# Patient Record
Sex: Male | Born: 1940
Health system: Southern US, Community
[De-identification: ages and names within clinical notes are randomized; demographics above are authoritative.]

## PROBLEM LIST (undated history)

## (undated) DIAGNOSIS — I499 Cardiac arrhythmia, unspecified: Secondary | ICD-10-CM

## (undated) DIAGNOSIS — I509 Heart failure, unspecified: Secondary | ICD-10-CM

## (undated) DIAGNOSIS — Z972 Presence of dental prosthetic device (complete) (partial): Secondary | ICD-10-CM

## (undated) DIAGNOSIS — R609 Edema, unspecified: Secondary | ICD-10-CM

## (undated) DIAGNOSIS — T4145XA Adverse effect of unspecified anesthetic, initial encounter: Secondary | ICD-10-CM

## (undated) DIAGNOSIS — I5022 Chronic systolic (congestive) heart failure: Secondary | ICD-10-CM

## (undated) DIAGNOSIS — R42 Dizziness and giddiness: Secondary | ICD-10-CM

## (undated) DIAGNOSIS — R112 Nausea with vomiting, unspecified: Secondary | ICD-10-CM

## (undated) DIAGNOSIS — G4730 Sleep apnea, unspecified: Secondary | ICD-10-CM

## (undated) DIAGNOSIS — R002 Palpitations: Secondary | ICD-10-CM

## (undated) DIAGNOSIS — N400 Enlarged prostate without lower urinary tract symptoms: Secondary | ICD-10-CM

## (undated) DIAGNOSIS — Z87442 Personal history of urinary calculi: Secondary | ICD-10-CM

## (undated) DIAGNOSIS — E785 Hyperlipidemia, unspecified: Secondary | ICD-10-CM

## (undated) DIAGNOSIS — G709 Myoneural disorder, unspecified: Secondary | ICD-10-CM

## (undated) DIAGNOSIS — I5032 Chronic diastolic (congestive) heart failure: Secondary | ICD-10-CM

## (undated) DIAGNOSIS — T8859XA Other complications of anesthesia, initial encounter: Secondary | ICD-10-CM

## (undated) DIAGNOSIS — Z9889 Other specified postprocedural states: Secondary | ICD-10-CM

## (undated) DIAGNOSIS — I10 Essential (primary) hypertension: Secondary | ICD-10-CM

## (undated) HISTORY — PX: HERNIA REPAIR: SHX51

## (undated) HISTORY — PX: CARDIAC CATHETERIZATION: SHX172

## (undated) HISTORY — DX: Heart failure, unspecified: I50.9

## (undated) HISTORY — PX: EYE SURGERY: SHX253

## (undated) HISTORY — DX: Cardiac arrhythmia, unspecified: I49.9

---

## 2005-03-04 ENCOUNTER — Ambulatory Visit: Payer: Self-pay | Admitting: Internal Medicine

## 2005-10-08 ENCOUNTER — Ambulatory Visit: Payer: Self-pay | Admitting: Unknown Physician Specialty

## 2011-02-16 ENCOUNTER — Ambulatory Visit: Payer: Self-pay | Admitting: Unknown Physician Specialty

## 2011-02-18 LAB — PATHOLOGY REPORT

## 2012-04-26 ENCOUNTER — Ambulatory Visit: Payer: Self-pay | Admitting: Internal Medicine

## 2013-02-07 ENCOUNTER — Emergency Department: Payer: Self-pay | Admitting: Emergency Medicine

## 2013-02-07 LAB — URINALYSIS, COMPLETE
Bacteria: NONE SEEN
Glucose,UR: NEGATIVE mg/dL (ref 0–75)
Ketone: NEGATIVE
Leukocyte Esterase: NEGATIVE
Nitrite: NEGATIVE
Ph: 7 (ref 4.5–8.0)
Protein: NEGATIVE
RBC,UR: 23 /HPF (ref 0–5)
Specific Gravity: 1.03 (ref 1.003–1.030)
Squamous Epithelial: NONE SEEN
WBC UR: NONE SEEN /HPF (ref 0–5)

## 2013-02-07 LAB — CK TOTAL AND CKMB (NOT AT ARMC)
CK, Total: 121 U/L (ref 35–232)
CK-MB: 1 ng/mL (ref 0.5–3.6)

## 2013-02-07 LAB — CBC
HCT: 40.3 % (ref 40.0–52.0)
HGB: 13.8 g/dL (ref 13.0–18.0)
MCHC: 34.1 g/dL (ref 32.0–36.0)
MCV: 87 fL (ref 80–100)
RBC: 4.63 10*6/uL (ref 4.40–5.90)
RDW: 14.4 % (ref 11.5–14.5)

## 2013-02-07 LAB — TROPONIN I: Troponin-I: 0.04 ng/mL

## 2013-02-07 LAB — PROTIME-INR
INR: 1
Prothrombin Time: 13 secs (ref 11.5–14.7)

## 2013-02-07 LAB — COMPREHENSIVE METABOLIC PANEL
Anion Gap: 5 — ABNORMAL LOW (ref 7–16)
Calcium, Total: 8.8 mg/dL (ref 8.5–10.1)
Chloride: 104 mmol/L (ref 98–107)
Co2: 29 mmol/L (ref 21–32)
EGFR (Non-African Amer.): 58 — ABNORMAL LOW
Glucose: 99 mg/dL (ref 65–99)
Osmolality: 279 (ref 275–301)
SGOT(AST): 23 U/L (ref 15–37)
SGPT (ALT): 30 U/L (ref 12–78)
Sodium: 138 mmol/L (ref 136–145)
Total Protein: 7.4 g/dL (ref 6.4–8.2)

## 2014-04-14 DIAGNOSIS — N200 Calculus of kidney: Secondary | ICD-10-CM | POA: Insufficient documentation

## 2014-04-14 DIAGNOSIS — N401 Enlarged prostate with lower urinary tract symptoms: Secondary | ICD-10-CM | POA: Insufficient documentation

## 2014-04-14 DIAGNOSIS — G4733 Obstructive sleep apnea (adult) (pediatric): Secondary | ICD-10-CM | POA: Insufficient documentation

## 2014-04-14 DIAGNOSIS — E7800 Pure hypercholesterolemia, unspecified: Secondary | ICD-10-CM | POA: Insufficient documentation

## 2014-04-14 DIAGNOSIS — I10 Essential (primary) hypertension: Secondary | ICD-10-CM | POA: Insufficient documentation

## 2014-04-14 DIAGNOSIS — N529 Male erectile dysfunction, unspecified: Secondary | ICD-10-CM | POA: Insufficient documentation

## 2014-04-14 DIAGNOSIS — N400 Enlarged prostate without lower urinary tract symptoms: Secondary | ICD-10-CM | POA: Insufficient documentation

## 2015-10-21 DIAGNOSIS — J019 Acute sinusitis, unspecified: Secondary | ICD-10-CM | POA: Diagnosis not present

## 2015-10-21 DIAGNOSIS — N390 Urinary tract infection, site not specified: Secondary | ICD-10-CM | POA: Diagnosis not present

## 2015-10-21 DIAGNOSIS — B9689 Other specified bacterial agents as the cause of diseases classified elsewhere: Secondary | ICD-10-CM | POA: Diagnosis not present

## 2015-10-21 DIAGNOSIS — J208 Acute bronchitis due to other specified organisms: Secondary | ICD-10-CM | POA: Diagnosis not present

## 2015-10-21 DIAGNOSIS — R030 Elevated blood-pressure reading, without diagnosis of hypertension: Secondary | ICD-10-CM | POA: Diagnosis not present

## 2015-10-21 DIAGNOSIS — E784 Other hyperlipidemia: Secondary | ICD-10-CM | POA: Diagnosis not present

## 2015-10-28 DIAGNOSIS — E782 Mixed hyperlipidemia: Secondary | ICD-10-CM | POA: Diagnosis not present

## 2015-10-28 DIAGNOSIS — Z125 Encounter for screening for malignant neoplasm of prostate: Secondary | ICD-10-CM | POA: Diagnosis not present

## 2015-10-28 DIAGNOSIS — Z0000 Encounter for general adult medical examination without abnormal findings: Secondary | ICD-10-CM | POA: Diagnosis not present

## 2015-10-28 DIAGNOSIS — I10 Essential (primary) hypertension: Secondary | ICD-10-CM | POA: Diagnosis not present

## 2015-10-28 DIAGNOSIS — E7800 Pure hypercholesterolemia, unspecified: Secondary | ICD-10-CM | POA: Diagnosis not present

## 2015-12-05 DIAGNOSIS — H9313 Tinnitus, bilateral: Secondary | ICD-10-CM | POA: Diagnosis not present

## 2015-12-05 DIAGNOSIS — G4733 Obstructive sleep apnea (adult) (pediatric): Secondary | ICD-10-CM | POA: Diagnosis not present

## 2015-12-05 DIAGNOSIS — E7800 Pure hypercholesterolemia, unspecified: Secondary | ICD-10-CM | POA: Diagnosis not present

## 2015-12-05 DIAGNOSIS — I10 Essential (primary) hypertension: Secondary | ICD-10-CM | POA: Diagnosis not present

## 2016-02-19 DIAGNOSIS — Z9889 Other specified postprocedural states: Secondary | ICD-10-CM | POA: Diagnosis not present

## 2016-02-19 DIAGNOSIS — Z86010 Personal history of colonic polyps: Secondary | ICD-10-CM | POA: Diagnosis not present

## 2016-02-19 DIAGNOSIS — R112 Nausea with vomiting, unspecified: Secondary | ICD-10-CM | POA: Diagnosis not present

## 2016-02-19 DIAGNOSIS — Z8371 Family history of colonic polyps: Secondary | ICD-10-CM | POA: Diagnosis not present

## 2016-04-01 DIAGNOSIS — H2513 Age-related nuclear cataract, bilateral: Secondary | ICD-10-CM | POA: Diagnosis not present

## 2016-04-20 DIAGNOSIS — R7309 Other abnormal glucose: Secondary | ICD-10-CM | POA: Diagnosis not present

## 2016-04-20 DIAGNOSIS — Z0000 Encounter for general adult medical examination without abnormal findings: Secondary | ICD-10-CM | POA: Diagnosis not present

## 2016-04-20 DIAGNOSIS — E7800 Pure hypercholesterolemia, unspecified: Secondary | ICD-10-CM | POA: Diagnosis not present

## 2016-04-20 DIAGNOSIS — Z125 Encounter for screening for malignant neoplasm of prostate: Secondary | ICD-10-CM | POA: Diagnosis not present

## 2016-04-20 DIAGNOSIS — I10 Essential (primary) hypertension: Secondary | ICD-10-CM | POA: Diagnosis not present

## 2016-04-27 DIAGNOSIS — I10 Essential (primary) hypertension: Secondary | ICD-10-CM | POA: Diagnosis not present

## 2016-04-27 DIAGNOSIS — Z0000 Encounter for general adult medical examination without abnormal findings: Secondary | ICD-10-CM | POA: Diagnosis not present

## 2016-04-27 DIAGNOSIS — G4733 Obstructive sleep apnea (adult) (pediatric): Secondary | ICD-10-CM | POA: Diagnosis not present

## 2016-04-27 DIAGNOSIS — E7800 Pure hypercholesterolemia, unspecified: Secondary | ICD-10-CM | POA: Diagnosis not present

## 2016-05-05 ENCOUNTER — Encounter: Payer: Self-pay | Admitting: *Deleted

## 2016-05-06 ENCOUNTER — Ambulatory Visit: Payer: PPO | Admitting: Anesthesiology

## 2016-05-06 ENCOUNTER — Ambulatory Visit
Admission: RE | Admit: 2016-05-06 | Discharge: 2016-05-06 | Disposition: A | Discharge status: Home / Self Care | Payer: PPO | Source: Ambulatory Visit | Attending: Unknown Physician Specialty | Admitting: Unknown Physician Specialty

## 2016-05-06 ENCOUNTER — Encounter: Payer: Self-pay | Admitting: *Deleted

## 2016-05-06 ENCOUNTER — Encounter
Admission: RE | Disposition: A | Discharge status: Home / Self Care | Payer: Self-pay | Source: Ambulatory Visit | Attending: Unknown Physician Specialty

## 2016-05-06 DIAGNOSIS — Z79899 Other long term (current) drug therapy: Secondary | ICD-10-CM | POA: Diagnosis not present

## 2016-05-06 DIAGNOSIS — K640 First degree hemorrhoids: Secondary | ICD-10-CM | POA: Insufficient documentation

## 2016-05-06 DIAGNOSIS — K635 Polyp of colon: Secondary | ICD-10-CM | POA: Diagnosis not present

## 2016-05-06 DIAGNOSIS — Z8371 Family history of colonic polyps: Secondary | ICD-10-CM | POA: Diagnosis not present

## 2016-05-06 DIAGNOSIS — Z86010 Personal history of colonic polyps: Secondary | ICD-10-CM | POA: Insufficient documentation

## 2016-05-06 DIAGNOSIS — N400 Enlarged prostate without lower urinary tract symptoms: Secondary | ICD-10-CM | POA: Diagnosis not present

## 2016-05-06 DIAGNOSIS — I10 Essential (primary) hypertension: Secondary | ICD-10-CM | POA: Insufficient documentation

## 2016-05-06 DIAGNOSIS — E785 Hyperlipidemia, unspecified: Secondary | ICD-10-CM | POA: Diagnosis not present

## 2016-05-06 DIAGNOSIS — K5730 Diverticulosis of large intestine without perforation or abscess without bleeding: Secondary | ICD-10-CM | POA: Diagnosis not present

## 2016-05-06 DIAGNOSIS — D122 Benign neoplasm of ascending colon: Secondary | ICD-10-CM | POA: Diagnosis not present

## 2016-05-06 DIAGNOSIS — K5790 Diverticulosis of intestine, part unspecified, without perforation or abscess without bleeding: Secondary | ICD-10-CM | POA: Diagnosis not present

## 2016-05-06 DIAGNOSIS — K648 Other hemorrhoids: Secondary | ICD-10-CM | POA: Diagnosis not present

## 2016-05-06 DIAGNOSIS — Z7982 Long term (current) use of aspirin: Secondary | ICD-10-CM | POA: Diagnosis not present

## 2016-05-06 DIAGNOSIS — Z1211 Encounter for screening for malignant neoplasm of colon: Secondary | ICD-10-CM | POA: Diagnosis not present

## 2016-05-06 HISTORY — DX: Benign prostatic hyperplasia without lower urinary tract symptoms: N40.0

## 2016-05-06 HISTORY — DX: Hyperlipidemia, unspecified: E78.5

## 2016-05-06 HISTORY — DX: Essential (primary) hypertension: I10

## 2016-05-06 HISTORY — DX: Sleep apnea, unspecified: G47.30

## 2016-05-06 HISTORY — PX: COLONOSCOPY WITH PROPOFOL: SHX5780

## 2016-05-06 SURGERY — COLONOSCOPY WITH PROPOFOL
Anesthesia: General

## 2016-05-06 MED ORDER — PROPOFOL 500 MG/50ML IV EMUL
INTRAVENOUS | Status: DC | PRN
  Administered 2016-05-06: 150 ug/kg/min via INTRAVENOUS

## 2016-05-06 MED ORDER — EPHEDRINE SULFATE 50 MG/ML IJ SOLN
INTRAMUSCULAR | Status: DC | PRN
  Administered 2016-05-06 (×2): 10 mg via INTRAVENOUS

## 2016-05-06 MED ORDER — ONDANSETRON HCL 4 MG/2ML IJ SOLN
INTRAMUSCULAR | Status: DC | PRN
  Administered 2016-05-06: 4 mg via INTRAVENOUS

## 2016-05-06 MED ORDER — SODIUM CHLORIDE 0.9 % IV SOLN
INTRAVENOUS | Status: DC
  Administered 2016-05-06: 10:00:00 via INTRAVENOUS

## 2016-05-06 MED ORDER — SODIUM CHLORIDE 0.9 % IV SOLN: INTRAVENOUS | Status: DC

## 2016-05-06 MED ORDER — PROPOFOL 10 MG/ML IV BOLUS
INTRAVENOUS | Status: DC | PRN
  Administered 2016-05-06: 50 mg via INTRAVENOUS
  Administered 2016-05-06: 20 mg via INTRAVENOUS

## 2016-05-06 NOTE — Anesthesia Preprocedure Evaluation (Addendum)
Anesthesia Evaluation  Patient identified by MRN, date of birth, ID band Patient awake    Reviewed: Allergy & Precautions, NPO status , Patient's Chart, lab work & pertinent test results  Airway Mallampati: III  TM Distance: >3 FB     Dental  (+) Chipped, Caps   Pulmonary sleep apnea ,    Pulmonary exam normal        Cardiovascular hypertension, Pt. on medications Normal cardiovascular exam     Neuro/Psych negative neurological ROS  negative psych ROS   GI/Hepatic negative GI ROS, Neg liver ROS,   Endo/Other  negative endocrine ROS  Renal/GU negative Renal ROS  negative genitourinary   Musculoskeletal negative musculoskeletal ROS (+)   Abdominal Normal abdominal exam  (+)   Peds negative pediatric ROS (+)  Hematology negative hematology ROS (+)   Anesthesia Other Findings   Reproductive/Obstetrics                            Anesthesia Physical Anesthesia Plan  ASA: III  Anesthesia Plan: General   Post-op Pain Management:    Induction: Intravenous  Airway Management Planned: Nasal Cannula  Additional Equipment:   Intra-op Plan:   Post-operative Plan:   Informed Consent: I have reviewed the patients History and Physical, chart, labs and discussed the procedure including the risks, benefits and alternatives for the proposed anesthesia with the patient or authorized representative who has indicated his/her understanding and acceptance.   Dental advisory given  Plan Discussed with: CRNA and Surgeon  Anesthesia Plan Comments:         Anesthesia Quick Evaluation

## 2016-05-06 NOTE — Transfer of Care (Signed)
Immediate Anesthesia Transfer of Care Note  Patient: Cody Caraway Sr.  Procedure(s) Performed: Procedure(s): COLONOSCOPY WITH PROPOFOL (N/A)  Patient Location: PACU  Anesthesia Type:General  Level of Consciousness: unresponsive  Airway & Oxygen Therapy: Patient Spontanous Breathing and Patient connected to nasal cannula oxygen  Post-op Assessment: Report given to RN and Post -op Vital signs reviewed and stable  Post vital signs: Reviewed and stable  Last Vitals:  Vitals:   05/06/16 0920 05/06/16 1032  BP: 140/79 (!) 111/57  Pulse: 82 87  Resp: 18 (!) 22  Temp: 36.9 C 36.6 C    Last Pain:  Vitals:   05/06/16 1032  TempSrc: Tympanic         Complications: No apparent anesthesia complications

## 2016-05-06 NOTE — Op Note (Signed)
[facility] Gastroenterology Patient Name: Cody Pollard Procedure Date: 05/06/2016 10:01 AM MRN: [id number] Account #: [id number] Date of Birth: May 13, 1941 Admit Type: Outpatient Age: 75 Room: [facility] ENDO ROOM 4 Gender: Male Note Status: Finalized Procedure:            Colonoscopy Indications:          High risk colon cancer surveillance: Personal history                        of colonic polyps Providers:            Manya Silvas, MD Referring MD:         Ocie Cornfield. Ouida Sills MD, MD (Referring MD) Medicines:            Propofol per Anesthesia Complications:        No immediate complications. Procedure:            Pre-Anesthesia Assessment:                       - After reviewing the risks and benefits, the patient                        was deemed in satisfactory condition to undergo the                        procedure.                       After obtaining informed consent, the colonoscope was                        passed under direct vision. Throughout the procedure,                        the patient's blood pressure, pulse, and oxygen                        saturations were monitored continuously. The                        Colonoscope was introduced through the anus and                        advanced to the the cecum, identified by appendiceal                        orifice and ileocecal valve. The colonoscopy was                        performed without difficulty. The patient tolerated the                        procedure well. The quality of the bowel preparation                        was excellent. Findings:      Two sessile polyps were found in the ascending colon. The polyps were       diminutive in size. These polyps were removed with a jumbo cold forceps.       Resection and retrieval were complete.      A  diminutive polyp was found in the proximal sigmoid colon. The polyp       was sessile. The polyp was removed with a jumbo cold  forceps. Resection       and retrieval were complete.      Multiple small and large-mouthed diverticula were found in the sigmoid       colon and descending colon.      Internal hemorrhoids were found during endoscopy. The hemorrhoids were       small and Grade I (internal hemorrhoids that do not prolapse). Impression:           - Two diminutive polyps in the ascending colon, removed                        with a jumbo cold forceps. Resected and retrieved.                       - One diminutive polyp in the proximal sigmoid colon,                        removed with a jumbo cold forceps. Resected and                        retrieved.                       - Diverticulosis in the sigmoid colon and in the                        descending colon.                       - Internal hemorrhoids. Recommendation:       - Await pathology results. Manya Silvas, MD 05/06/2016 10:30:02 AM This report has been signed electronically. Number of Addenda: 0 Note Initiated On: 05/06/2016 10:01 AM Scope Withdrawal Time: 0 hours 11 minutes 11 seconds  Total Procedure Duration: 0 hours 15 minutes 24 seconds       [facility]

## 2016-05-06 NOTE — Anesthesia Postprocedure Evaluation (Signed)
Anesthesia Post Note  Patient: ANGELES KOELLNER Sr.  Procedure(s) Performed: Procedure(s) (LRB): COLONOSCOPY WITH PROPOFOL (N/A)  Patient location during evaluation: PACU Anesthesia Type: General Level of consciousness: awake and alert and oriented Pain management: pain level controlled Vital Signs Assessment: post-procedure vital signs reviewed and stable Respiratory status: spontaneous breathing Cardiovascular status: blood pressure returned to baseline Anesthetic complications: no    Last Vitals:  Vitals:   05/06/16 1053 05/06/16 1102  BP: (!) 147/72 (!) 163/80  Pulse: 80 (!) 109  Resp: 12 (!) 21  Temp:      Last Pain:  Vitals:   05/06/16 1032  TempSrc: Tympanic                 Alvey Brockel

## 2016-05-06 NOTE — H&P (Signed)
   Primary Care Physician:  Kirk Ruths., MD Primary Gastroenterologist:  Dr. Vira Agar  Pre-Procedure History & Physical: HPI:  Cody Caraway Sr. is a 75 y.o. male is here for an colonoscopy.   Past Medical History:  Diagnosis Date  . BPH (benign prostatic hyperplasia)   . Hyperlipidemia   . Hypertension   . Sleep apnea     History reviewed. No pertinent surgical history.  Prior to Admission medications   Medication Sig Start Date End Date Taking? Authorizing Provider  amLODipine (NORVASC) 5 MG tablet Take 5 mg by mouth daily.   Yes Historical Provider, MD  aspirin EC 81 MG tablet Take 81 mg by mouth daily.   Yes Historical Provider, MD  dutasteride (AVODART) 0.5 MG capsule Take 0.5 mg by mouth daily.   Yes Historical Provider, MD  ondansetron (ZOFRAN-ODT) 4 MG disintegrating tablet Take 4 mg by mouth every 8 (eight) hours as needed for nausea or vomiting.   Yes Historical Provider, MD  rosuvastatin (CRESTOR) 10 MG tablet Take 10 mg by mouth daily.   Yes Historical Provider, MD    Allergies as of 03/12/2016  . (Not on File)    History reviewed. No pertinent family history.  Social History   Social History  . Marital status: Married    Spouse name: N/A  . Number of children: N/A  . Years of education: N/A   Occupational History  . Not on file.   Social History Main Topics  . Smoking status: Never Smoker  . Smokeless tobacco: Never Used  . Alcohol use No  . Drug use: No  . Sexual activity: Not on file   Other Topics Concern  . Not on file   Social History Narrative  . No narrative on file    Review of Systems: See HPI, otherwise negative ROS  Physical Exam: BP 140/79   Pulse 82   Temp 98.4 F (36.9 C) (Tympanic)   Resp 18   Ht 5\' 11"  (1.803 m)   Wt 104.3 kg (230 lb)   SpO2 100%   BMI 32.08 kg/m  General:   Alert,  pleasant and cooperative in NAD Head:  Normocephalic and atraumatic. Neck:  Supple; no masses or thyromegaly. Lungs:   Clear throughout to auscultation.    Heart:  Regular rate and rhythm. Abdomen:  Soft, nontender and nondistended. Normal bowel sounds, without guarding, and without rebound.   Neurologic:  Alert and  oriented x4;  grossly normal neurologically.  Impression/Plan: Cody Caraway Sr. is here for an colonoscopy to be performed for [facility] colon polyps and [facility] colon polyps in brother.  Risks, benefits, limitations, and alternatives regarding  colonoscopy have been reviewed with the patient.  Questions have been answered.  All parties agreeable.   Gaylyn Cheers, MD  05/06/2016, 9:58 AM

## 2016-05-07 ENCOUNTER — Encounter: Payer: Self-pay | Admitting: Unknown Physician Specialty

## 2016-05-07 LAB — SURGICAL PATHOLOGY

## 2016-05-27 DIAGNOSIS — L820 Inflamed seborrheic keratosis: Secondary | ICD-10-CM | POA: Diagnosis not present

## 2016-05-27 DIAGNOSIS — L570 Actinic keratosis: Secondary | ICD-10-CM | POA: Diagnosis not present

## 2016-05-27 DIAGNOSIS — D1800 Hemangioma unspecified site: Secondary | ICD-10-CM | POA: Diagnosis not present

## 2016-05-27 DIAGNOSIS — L918 Other hypertrophic disorders of the skin: Secondary | ICD-10-CM | POA: Diagnosis not present

## 2016-05-27 DIAGNOSIS — L821 Other seborrheic keratosis: Secondary | ICD-10-CM | POA: Diagnosis not present

## 2016-05-27 DIAGNOSIS — I788 Other diseases of capillaries: Secondary | ICD-10-CM | POA: Diagnosis not present

## 2016-05-27 DIAGNOSIS — Z1283 Encounter for screening for malignant neoplasm of skin: Secondary | ICD-10-CM | POA: Diagnosis not present

## 2016-05-27 DIAGNOSIS — L718 Other rosacea: Secondary | ICD-10-CM | POA: Diagnosis not present

## 2016-05-27 DIAGNOSIS — L578 Other skin changes due to chronic exposure to nonionizing radiation: Secondary | ICD-10-CM | POA: Diagnosis not present

## 2016-05-27 DIAGNOSIS — D692 Other nonthrombocytopenic purpura: Secondary | ICD-10-CM | POA: Diagnosis not present

## 2016-05-27 DIAGNOSIS — D229 Melanocytic nevi, unspecified: Secondary | ICD-10-CM | POA: Diagnosis not present

## 2016-05-27 DIAGNOSIS — L812 Freckles: Secondary | ICD-10-CM | POA: Diagnosis not present

## 2016-06-01 DIAGNOSIS — J019 Acute sinusitis, unspecified: Secondary | ICD-10-CM | POA: Diagnosis not present

## 2016-06-01 DIAGNOSIS — I10 Essential (primary) hypertension: Secondary | ICD-10-CM | POA: Diagnosis not present

## 2016-06-01 DIAGNOSIS — R6889 Other general symptoms and signs: Secondary | ICD-10-CM | POA: Diagnosis not present

## 2016-06-01 DIAGNOSIS — B9689 Other specified bacterial agents as the cause of diseases classified elsewhere: Secondary | ICD-10-CM | POA: Diagnosis not present

## 2016-08-13 DIAGNOSIS — M25562 Pain in left knee: Secondary | ICD-10-CM | POA: Diagnosis not present

## 2016-08-13 DIAGNOSIS — G8929 Other chronic pain: Secondary | ICD-10-CM | POA: Diagnosis not present

## 2016-08-13 DIAGNOSIS — M2392 Unspecified internal derangement of left knee: Secondary | ICD-10-CM | POA: Diagnosis not present

## 2016-10-19 DIAGNOSIS — E7800 Pure hypercholesterolemia, unspecified: Secondary | ICD-10-CM | POA: Diagnosis not present

## 2016-10-19 DIAGNOSIS — R739 Hyperglycemia, unspecified: Secondary | ICD-10-CM | POA: Diagnosis not present

## 2016-10-19 DIAGNOSIS — Z0000 Encounter for general adult medical examination without abnormal findings: Secondary | ICD-10-CM | POA: Diagnosis not present

## 2016-10-19 DIAGNOSIS — I10 Essential (primary) hypertension: Secondary | ICD-10-CM | POA: Diagnosis not present

## 2016-10-26 DIAGNOSIS — E782 Mixed hyperlipidemia: Secondary | ICD-10-CM | POA: Diagnosis not present

## 2016-10-26 DIAGNOSIS — R7303 Prediabetes: Secondary | ICD-10-CM | POA: Insufficient documentation

## 2016-10-26 DIAGNOSIS — G4733 Obstructive sleep apnea (adult) (pediatric): Secondary | ICD-10-CM | POA: Diagnosis not present

## 2016-10-26 DIAGNOSIS — Z125 Encounter for screening for malignant neoplasm of prostate: Secondary | ICD-10-CM | POA: Diagnosis not present

## 2016-10-26 DIAGNOSIS — R7301 Impaired fasting glucose: Secondary | ICD-10-CM | POA: Diagnosis not present

## 2016-10-26 DIAGNOSIS — I10 Essential (primary) hypertension: Secondary | ICD-10-CM | POA: Diagnosis not present

## 2016-10-26 DIAGNOSIS — E7800 Pure hypercholesterolemia, unspecified: Secondary | ICD-10-CM | POA: Diagnosis not present

## 2017-03-17 ENCOUNTER — Other Ambulatory Visit: Payer: Self-pay | Admitting: Unknown Physician Specialty

## 2017-03-17 DIAGNOSIS — R51 Headache: Secondary | ICD-10-CM | POA: Diagnosis not present

## 2017-03-17 DIAGNOSIS — M792 Neuralgia and neuritis, unspecified: Secondary | ICD-10-CM | POA: Diagnosis not present

## 2017-03-17 DIAGNOSIS — G500 Trigeminal neuralgia: Secondary | ICD-10-CM

## 2017-03-22 ENCOUNTER — Ambulatory Visit
Admission: RE | Admit: 2017-03-22 | Discharge: 2017-03-22 | Disposition: A | Discharge status: Home / Self Care | Payer: PPO | Source: Ambulatory Visit | Attending: Unknown Physician Specialty | Admitting: Unknown Physician Specialty

## 2017-03-22 DIAGNOSIS — G500 Trigeminal neuralgia: Secondary | ICD-10-CM | POA: Diagnosis not present

## 2017-03-22 DIAGNOSIS — R51 Headache: Secondary | ICD-10-CM | POA: Diagnosis not present

## 2017-03-22 LAB — POCT I-STAT CREATININE: Creatinine, Ser: 1.1 mg/dL (ref 0.61–1.24)

## 2017-03-22 MED ORDER — GADOBENATE DIMEGLUMINE 529 MG/ML IV SOLN
20.0000 mL | Freq: Once | INTRAVENOUS | Status: AC | PRN
  Administered 2017-03-22: 20 mL via INTRAVENOUS

## 2017-04-05 DIAGNOSIS — G500 Trigeminal neuralgia: Secondary | ICD-10-CM | POA: Diagnosis not present

## 2017-04-26 DIAGNOSIS — I10 Essential (primary) hypertension: Secondary | ICD-10-CM | POA: Diagnosis not present

## 2017-04-26 DIAGNOSIS — Z125 Encounter for screening for malignant neoplasm of prostate: Secondary | ICD-10-CM | POA: Diagnosis not present

## 2017-04-26 DIAGNOSIS — R7301 Impaired fasting glucose: Secondary | ICD-10-CM | POA: Diagnosis not present

## 2017-04-26 DIAGNOSIS — E7800 Pure hypercholesterolemia, unspecified: Secondary | ICD-10-CM | POA: Diagnosis not present

## 2017-04-30 DIAGNOSIS — E7800 Pure hypercholesterolemia, unspecified: Secondary | ICD-10-CM | POA: Diagnosis not present

## 2017-04-30 DIAGNOSIS — I10 Essential (primary) hypertension: Secondary | ICD-10-CM | POA: Diagnosis not present

## 2017-04-30 DIAGNOSIS — G4733 Obstructive sleep apnea (adult) (pediatric): Secondary | ICD-10-CM | POA: Diagnosis not present

## 2017-04-30 DIAGNOSIS — R7301 Impaired fasting glucose: Secondary | ICD-10-CM | POA: Diagnosis not present

## 2017-04-30 DIAGNOSIS — Z0000 Encounter for general adult medical examination without abnormal findings: Secondary | ICD-10-CM | POA: Diagnosis not present

## 2017-08-04 DIAGNOSIS — H2513 Age-related nuclear cataract, bilateral: Secondary | ICD-10-CM | POA: Diagnosis not present

## 2017-09-03 DIAGNOSIS — H2512 Age-related nuclear cataract, left eye: Secondary | ICD-10-CM | POA: Diagnosis not present

## 2017-09-09 ENCOUNTER — Encounter: Payer: Self-pay | Admitting: *Deleted

## 2017-09-23 ENCOUNTER — Encounter
Admission: RE | Disposition: A | Discharge status: Home / Self Care | Payer: Self-pay | Source: Ambulatory Visit | Attending: Ophthalmology

## 2017-09-23 ENCOUNTER — Other Ambulatory Visit: Payer: Self-pay

## 2017-09-23 ENCOUNTER — Ambulatory Visit
Admission: RE | Admit: 2017-09-23 | Discharge: 2017-09-23 | Disposition: A | Discharge status: Home / Self Care | Payer: PPO | Source: Ambulatory Visit | Attending: Ophthalmology | Admitting: Ophthalmology

## 2017-09-23 ENCOUNTER — Ambulatory Visit: Payer: PPO | Admitting: Anesthesiology

## 2017-09-23 DIAGNOSIS — G4730 Sleep apnea, unspecified: Secondary | ICD-10-CM | POA: Diagnosis not present

## 2017-09-23 DIAGNOSIS — H2512 Age-related nuclear cataract, left eye: Secondary | ICD-10-CM | POA: Insufficient documentation

## 2017-09-23 DIAGNOSIS — Z6831 Body mass index (BMI) 31.0-31.9, adult: Secondary | ICD-10-CM | POA: Insufficient documentation

## 2017-09-23 DIAGNOSIS — G500 Trigeminal neuralgia: Secondary | ICD-10-CM | POA: Diagnosis not present

## 2017-09-23 DIAGNOSIS — N400 Enlarged prostate without lower urinary tract symptoms: Secondary | ICD-10-CM | POA: Insufficient documentation

## 2017-09-23 DIAGNOSIS — I10 Essential (primary) hypertension: Secondary | ICD-10-CM | POA: Insufficient documentation

## 2017-09-23 DIAGNOSIS — Z880 Allergy status to penicillin: Secondary | ICD-10-CM | POA: Diagnosis not present

## 2017-09-23 DIAGNOSIS — E669 Obesity, unspecified: Secondary | ICD-10-CM | POA: Diagnosis not present

## 2017-09-23 DIAGNOSIS — R42 Dizziness and giddiness: Secondary | ICD-10-CM | POA: Insufficient documentation

## 2017-09-23 DIAGNOSIS — M7989 Other specified soft tissue disorders: Secondary | ICD-10-CM | POA: Diagnosis not present

## 2017-09-23 DIAGNOSIS — R002 Palpitations: Secondary | ICD-10-CM | POA: Diagnosis not present

## 2017-09-23 DIAGNOSIS — E7800 Pure hypercholesterolemia, unspecified: Secondary | ICD-10-CM | POA: Diagnosis not present

## 2017-09-23 DIAGNOSIS — Z87442 Personal history of urinary calculi: Secondary | ICD-10-CM | POA: Insufficient documentation

## 2017-09-23 HISTORY — PX: CATARACT EXTRACTION W/PHACO: SHX586

## 2017-09-23 HISTORY — DX: Dizziness and giddiness: R42

## 2017-09-23 HISTORY — DX: Nausea with vomiting, unspecified: Z98.890

## 2017-09-23 HISTORY — DX: Edema, unspecified: R60.9

## 2017-09-23 HISTORY — DX: Palpitations: R00.2

## 2017-09-23 HISTORY — DX: Myoneural disorder, unspecified: G70.9

## 2017-09-23 HISTORY — DX: Nausea with vomiting, unspecified: R11.2

## 2017-09-23 HISTORY — DX: Other complications of anesthesia, initial encounter: T88.59XA

## 2017-09-23 HISTORY — DX: Personal history of urinary calculi: Z87.442

## 2017-09-23 HISTORY — DX: Adverse effect of unspecified anesthetic, initial encounter: T41.45XA

## 2017-09-23 SURGERY — PHACOEMULSIFICATION, CATARACT, WITH IOL INSERTION
Anesthesia: Monitor Anesthesia Care | Site: Eye | Laterality: Left | Wound class: Clean

## 2017-09-23 MED ORDER — LIDOCAINE HCL (PF) 4 % IJ SOLN
INTRAMUSCULAR | Status: AC
  Filled 2017-09-23: qty 5

## 2017-09-23 MED ORDER — SODIUM HYALURONATE 10 MG/ML IO SOLN
INTRAOCULAR | Status: DC | PRN
  Administered 2017-09-23: .55 mL via INTRAOCULAR

## 2017-09-23 MED ORDER — SODIUM HYALURONATE 23 MG/ML IO SOLN
INTRAOCULAR | Status: AC
  Filled 2017-09-23: qty 0.6

## 2017-09-23 MED ORDER — MOXIFLOXACIN HCL 0.5 % OP SOLN
OPHTHALMIC | Status: DC | PRN
  Administered 2017-09-23: .2 mL via OPHTHALMIC

## 2017-09-23 MED ORDER — BSS IO SOLN
INTRAOCULAR | Status: DC | PRN
  Administered 2017-09-23: 2 mL via OPHTHALMIC

## 2017-09-23 MED ORDER — SODIUM CHLORIDE 0.9 % IV SOLN
INTRAVENOUS | Status: DC
  Administered 2017-09-23: 09:00:00 via INTRAVENOUS

## 2017-09-23 MED ORDER — EPINEPHRINE PF 1 MG/ML IJ SOLN
INTRAOCULAR | Status: DC | PRN
  Administered 2017-09-23: 1 mL via OPHTHALMIC

## 2017-09-23 MED ORDER — MOXIFLOXACIN HCL 0.5 % OP SOLN
OPHTHALMIC | Status: AC
  Filled 2017-09-23: qty 3

## 2017-09-23 MED ORDER — POVIDONE-IODINE 5 % OP SOLN
OPHTHALMIC | Status: AC
  Filled 2017-09-23: qty 30

## 2017-09-23 MED ORDER — SODIUM HYALURONATE 23 MG/ML IO SOLN
INTRAOCULAR | Status: DC | PRN
  Administered 2017-09-23: .6 mL via INTRAOCULAR

## 2017-09-23 MED ORDER — MIDAZOLAM HCL 2 MG/2ML IJ SOLN
INTRAMUSCULAR | Status: DC | PRN
  Administered 2017-09-23: 2 mg via INTRAVENOUS

## 2017-09-23 MED ORDER — ARMC OPHTHALMIC DILATING DROPS
OPHTHALMIC | Status: AC
  Administered 2017-09-23: 1 via OPHTHALMIC
  Filled 2017-09-23: qty 0.4

## 2017-09-23 MED ORDER — MIDAZOLAM HCL 2 MG/2ML IJ SOLN
INTRAMUSCULAR | Status: AC
  Filled 2017-09-23: qty 2

## 2017-09-23 MED ORDER — POVIDONE-IODINE 5 % OP SOLN
OPHTHALMIC | Status: DC | PRN
  Administered 2017-09-23: 1 via OPHTHALMIC

## 2017-09-23 MED ORDER — MOXIFLOXACIN HCL 0.5 % OP SOLN: 1.0000 [drp] | OPHTHALMIC | Status: DC | PRN

## 2017-09-23 MED ORDER — EPINEPHRINE PF 1 MG/ML IJ SOLN
INTRAMUSCULAR | Status: AC
  Filled 2017-09-23: qty 1

## 2017-09-23 MED ORDER — ARMC OPHTHALMIC DILATING DROPS
1.0000 "application " | OPHTHALMIC | Status: AC
  Administered 2017-09-23 (×3): 1 via OPHTHALMIC

## 2017-09-23 SURGICAL SUPPLY — 16 items
DISSECTOR HYDRO NUCLEUS 50X22 (MISCELLANEOUS) ×3 IMPLANT
GLOVE BIO SURGEON STRL SZ8 (GLOVE) ×3 IMPLANT
GLOVE BIOGEL M 6.5 STRL (GLOVE) ×3 IMPLANT
GLOVE SURG LX 7.5 STRW (GLOVE) ×2
GLOVE SURG LX STRL 7.5 STRW (GLOVE) ×1 IMPLANT
GOWN STRL REUS W/ TWL LRG LVL3 (GOWN DISPOSABLE) ×2 IMPLANT
GOWN STRL REUS W/TWL LRG LVL3 (GOWN DISPOSABLE) ×4
LABEL CATARACT MEDS ST (LABEL) ×3 IMPLANT
LENS IOL TECNIS ITEC 17.0 (Intraocular Lens) ×3 IMPLANT
PACK CATARACT (MISCELLANEOUS) ×3 IMPLANT
PACK CATARACT KING (MISCELLANEOUS) ×3 IMPLANT
PACK EYE AFTER SURG (MISCELLANEOUS) ×3 IMPLANT
SOL BSS BAG (MISCELLANEOUS) ×3
SOLUTION BSS BAG (MISCELLANEOUS) ×1 IMPLANT
WATER STERILE IRR 250ML POUR (IV SOLUTION) ×3 IMPLANT
WIPE NON LINTING 3.25X3.25 (MISCELLANEOUS) ×3 IMPLANT

## 2017-09-23 NOTE — Anesthesia Preprocedure Evaluation (Signed)
Anesthesia Evaluation  Patient identified by MRN, date of birth, ID band Patient awake    Reviewed: Allergy & Precautions, NPO status , Patient's Chart, lab work & pertinent test results  History of Anesthesia Complications (+) PONV and history of anesthetic complications  Airway Mallampati: II  TM Distance: >3 FB Neck ROM: Full    Dental no notable dental hx.    Pulmonary sleep apnea (has CPAP prescribed but does not use) , neg COPD,    breath sounds clear to auscultation- rhonchi (-) wheezing      Cardiovascular Exercise Tolerance: Good hypertension, Pt. on medications (-) CAD, (-) Past MI, (-) Cardiac Stents and (-) CABG  Rhythm:Regular Rate:Normal - Systolic murmurs and - Diastolic murmurs    Neuro/Psych negative neurological ROS  negative psych ROS   GI/Hepatic negative GI ROS, Neg liver ROS,   Endo/Other  negative endocrine ROSneg diabetes  Renal/GU negative Renal ROS     Musculoskeletal negative musculoskeletal ROS (+)   Abdominal (+) + obese,   Peds  Hematology negative hematology ROS (+)   Anesthesia Other Findings Past Medical History: No date: BPH (benign prostatic hyperplasia) No date: Complication of anesthesia No date: Edema     Comment:  MILD FEET/ANKLES OCCAS No date: History of kidney stones No date: Hyperlipidemia No date: Hypertension No date: Neuromuscular disorder (HCC)     Comment:  TRIGEMINAL NEURALGIA No date: Palpitations No date: PONV (postoperative nausea and vomiting) No date: Sleep apnea     Comment:  DOES NOT USE CPAP No date: Vertigo   Reproductive/Obstetrics                             Anesthesia Physical Anesthesia Plan  ASA: II  Anesthesia Plan: MAC   Post-op Pain Management:    Induction: Intravenous  PONV Risk Score and Plan: 2 and Midazolam  Airway Management Planned: Natural Airway  Additional Equipment:   Intra-op Plan:    Post-operative Plan:   Informed Consent: I have reviewed the patients History and Physical, chart, labs and discussed the procedure including the risks, benefits and alternatives for the proposed anesthesia with the patient or authorized representative who has indicated his/her understanding and acceptance.     Plan Discussed with: CRNA and Anesthesiologist  Anesthesia Plan Comments:         Anesthesia Quick Evaluation

## 2017-09-23 NOTE — H&P (Signed)
The History and Physical notes are on paper, have been signed, and are to be scanned.   I have examined the patient and there are no changes to the H&P.   Benay Pillow 09/23/2017 11:05 AM

## 2017-09-23 NOTE — Op Note (Signed)
OPERATIVE NOTE  Cody SHEVCHENKO Sr. [id number] 09/23/2017   PREOPERATIVE DIAGNOSIS:  Nuclear sclerotic cataract left eye.  H25.12   POSTOPERATIVE DIAGNOSIS:    Nuclear sclerotic cataract left eye.     PROCEDURE:  Phacoemusification with posterior chamber intraocular lens placement of the left eye   LENS:   Implant Name Type Inv. Item Serial No. Manufacturer Lot No. LRB No. Used  LENS IOL DIOP 17.0 - [id number] Intraocular Lens LENS IOL DIOP 17.0 [id number] AMO  Left 1       PCB00 +17.0   ULTRASOUND TIME: 0 minutes 19 seconds.  CDE 1.64   SURGEON:  Benay Pillow, MD, MPH   ANESTHESIA:  Topical with tetracaine drops augmented with 1% preservative-free intracameral lidocaine.  ESTIMATED BLOOD LOSS: <1 mL   COMPLICATIONS:  None.   DESCRIPTION OF PROCEDURE:  The patient was identified in the holding room and transported to the operating room and placed in the supine position under the operating microscope.  The left eye was identified as the operative eye and it was prepped and draped in the usual sterile ophthalmic fashion.   A 1.0 millimeter clear-corneal paracentesis was made at the 5:00 position. 0.5 ml of preservative-free 1% lidocaine with epinephrine was injected into the anterior chamber.  The anterior chamber was filled with Healon 5 viscoelastic.  A 2.4 millimeter keratome was used to make a near-clear corneal incision at the 2:00 position.  A curvilinear capsulorrhexis was made with a cystotome and capsulorrhexis forceps.  Balanced salt solution was used to hydrodissect and hydrodelineate the nucleus.   Phacoemulsification was then used in stop and chop fashion to remove the lens nucleus and epinucleus.  The remaining cortex was then removed using the irrigation and aspiration handpiece. Healon was then placed into the capsular bag to distend it for lens placement.  A lens was then injected into the capsular bag.  The remaining viscoelastic was aspirated.   Wounds were  hydrated with balanced salt solution.  The anterior chamber was inflated to a physiologic pressure with balanced salt solution.  Intracameral vigamox 0.1 mL undiltued was injected into the eye and a drop placed onto the ocular surface.  No wound leaks were noted.  The patient was taken to the recovery room in stable condition without complications of anesthesia or surgery  Benay Pillow 09/23/2017, 11:35 AM

## 2017-09-23 NOTE — Anesthesia Post-op Follow-up Note (Signed)
Anesthesia QCDR form completed.        

## 2017-09-23 NOTE — Discharge Instructions (Signed)
Eye Surgery Discharge Instructions ° ° ° °Expect mild scratchy sensation or mild soreness. °DO NOT RUB YOUR EYE! ° °The day of surgery: °Minimal physical activity, but bed rest is not required °No reading, computer work, or close hand work °No bending, lifting, or straining. °May watch TV ° °For 24 hours: °No driving, legal decisions, or alcoholic beverages °Safety precautions °Eat anything you prefer: It is better to start with liquids, then soup then solid foods. °_____ Eye patch should be worn until postoperative exam tomorrow. °____ Solar shield eyeglasses should be worn for comfort in the sunlight/patch while sleeping ° °Resume all regular medications including aspirin or Coumadin if these were discontinued prior to surgery. °You may shower, bathe, shave, or wash your hair. °Tylenol may be taken for mild discomfort. ° °Call your doctor if you experience significant pain, nausea, or vomiting, fever > 101 or other signs of infection. [phone] or [phone] °Specific instructions: ° ° Follow-up Information   ° ° King, Bradley Mark, MD Follow up.   °Specialty: Ophthalmology °Contact information: °[address] °[address] °[place] [address] °[phone] ° ° °  °  ° °  °  ° °  °  °

## 2017-09-23 NOTE — Anesthesia Postprocedure Evaluation (Signed)
Anesthesia Post Note  Patient: DURIEL DEERY Sr.  Procedure(s) Performed: CATARACT EXTRACTION PHACO AND INTRAOCULAR LENS PLACEMENT ([place]) (Left Eye)  Patient location during evaluation: PACU Anesthesia Type: MAC Level of consciousness: awake and alert and oriented Pain management: pain level controlled Vital Signs Assessment: post-procedure vital signs reviewed and stable Respiratory status: spontaneous breathing, nonlabored ventilation and respiratory function stable Cardiovascular status: blood pressure returned to baseline and stable Postop Assessment: no signs of nausea or vomiting Anesthetic complications: no     Last Vitals:  Vitals:   09/23/17 1138 09/23/17 1150  BP: (!) 141/71 136/72  Pulse:    Resp: 18   Temp: (!) 36.2 C   SpO2: 99%     Last Pain:  Vitals:   09/23/17 1138  TempSrc:   PainSc: 0-No pain                 Daquane Aguilar

## 2017-09-23 NOTE — Transfer of Care (Signed)
Immediate Anesthesia Transfer of Care Note  Patient: Cody Caraway Sr.  Procedure(s) Performed: CATARACT EXTRACTION PHACO AND INTRAOCULAR LENS PLACEMENT ([place]) (Left Eye)  Patient Location: PACU  Anesthesia Type:MAC  Level of Consciousness: awake  Airway & Oxygen Therapy: Patient Spontanous Breathing  Post-op Assessment: Report given to RN  Post vital signs: stable  Last Vitals:  Vitals Value Taken Time  BP    Temp    Pulse    Resp    SpO2      Last Pain:  Vitals:   09/23/17 0839  TempSrc: Temporal  PainSc: 0-No pain         Complications: No apparent anesthesia complications

## 2017-10-04 DIAGNOSIS — I493 Ventricular premature depolarization: Secondary | ICD-10-CM | POA: Diagnosis not present

## 2017-10-05 DIAGNOSIS — G4733 Obstructive sleep apnea (adult) (pediatric): Secondary | ICD-10-CM | POA: Diagnosis not present

## 2017-10-05 DIAGNOSIS — I10 Essential (primary) hypertension: Secondary | ICD-10-CM | POA: Diagnosis not present

## 2017-10-05 DIAGNOSIS — G500 Trigeminal neuralgia: Secondary | ICD-10-CM | POA: Diagnosis not present

## 2017-10-05 DIAGNOSIS — E7800 Pure hypercholesterolemia, unspecified: Secondary | ICD-10-CM | POA: Diagnosis not present

## 2017-10-05 DIAGNOSIS — R079 Chest pain, unspecified: Secondary | ICD-10-CM | POA: Diagnosis not present

## 2017-10-05 DIAGNOSIS — I493 Ventricular premature depolarization: Secondary | ICD-10-CM | POA: Diagnosis not present

## 2017-10-11 DIAGNOSIS — I493 Ventricular premature depolarization: Secondary | ICD-10-CM | POA: Insufficient documentation

## 2017-10-12 DIAGNOSIS — R079 Chest pain, unspecified: Secondary | ICD-10-CM | POA: Diagnosis not present

## 2017-10-25 DIAGNOSIS — E7800 Pure hypercholesterolemia, unspecified: Secondary | ICD-10-CM | POA: Diagnosis not present

## 2017-10-25 DIAGNOSIS — I493 Ventricular premature depolarization: Secondary | ICD-10-CM | POA: Diagnosis not present

## 2017-10-25 DIAGNOSIS — I10 Essential (primary) hypertension: Secondary | ICD-10-CM | POA: Diagnosis not present

## 2017-10-25 DIAGNOSIS — G4733 Obstructive sleep apnea (adult) (pediatric): Secondary | ICD-10-CM | POA: Diagnosis not present

## 2017-11-04 DIAGNOSIS — R7301 Impaired fasting glucose: Secondary | ICD-10-CM | POA: Diagnosis not present

## 2017-11-04 DIAGNOSIS — I10 Essential (primary) hypertension: Secondary | ICD-10-CM | POA: Diagnosis not present

## 2017-11-04 DIAGNOSIS — E7800 Pure hypercholesterolemia, unspecified: Secondary | ICD-10-CM | POA: Diagnosis not present

## 2017-11-11 DIAGNOSIS — I10 Essential (primary) hypertension: Secondary | ICD-10-CM | POA: Diagnosis not present

## 2017-11-11 DIAGNOSIS — E7800 Pure hypercholesterolemia, unspecified: Secondary | ICD-10-CM | POA: Diagnosis not present

## 2017-11-11 DIAGNOSIS — Z125 Encounter for screening for malignant neoplasm of prostate: Secondary | ICD-10-CM | POA: Diagnosis not present

## 2017-11-11 DIAGNOSIS — R7301 Impaired fasting glucose: Secondary | ICD-10-CM | POA: Diagnosis not present

## 2017-11-11 DIAGNOSIS — G4733 Obstructive sleep apnea (adult) (pediatric): Secondary | ICD-10-CM | POA: Diagnosis not present

## 2018-04-11 DIAGNOSIS — G500 Trigeminal neuralgia: Secondary | ICD-10-CM | POA: Diagnosis not present

## 2018-04-25 DIAGNOSIS — Z125 Encounter for screening for malignant neoplasm of prostate: Secondary | ICD-10-CM | POA: Diagnosis not present

## 2018-04-25 DIAGNOSIS — R7301 Impaired fasting glucose: Secondary | ICD-10-CM | POA: Diagnosis not present

## 2018-04-25 DIAGNOSIS — I10 Essential (primary) hypertension: Secondary | ICD-10-CM | POA: Diagnosis not present

## 2018-04-25 DIAGNOSIS — E7800 Pure hypercholesterolemia, unspecified: Secondary | ICD-10-CM | POA: Diagnosis not present

## 2018-05-02 DIAGNOSIS — R7301 Impaired fasting glucose: Secondary | ICD-10-CM | POA: Diagnosis not present

## 2018-05-02 DIAGNOSIS — G4733 Obstructive sleep apnea (adult) (pediatric): Secondary | ICD-10-CM | POA: Diagnosis not present

## 2018-05-02 DIAGNOSIS — Z0000 Encounter for general adult medical examination without abnormal findings: Secondary | ICD-10-CM | POA: Diagnosis not present

## 2018-05-02 DIAGNOSIS — E7800 Pure hypercholesterolemia, unspecified: Secondary | ICD-10-CM | POA: Diagnosis not present

## 2018-05-02 DIAGNOSIS — I493 Ventricular premature depolarization: Secondary | ICD-10-CM | POA: Diagnosis not present

## 2018-05-02 DIAGNOSIS — I10 Essential (primary) hypertension: Secondary | ICD-10-CM | POA: Diagnosis not present

## 2018-10-27 DIAGNOSIS — R7301 Impaired fasting glucose: Secondary | ICD-10-CM | POA: Diagnosis not present

## 2018-10-27 DIAGNOSIS — I10 Essential (primary) hypertension: Secondary | ICD-10-CM | POA: Diagnosis not present

## 2018-10-27 DIAGNOSIS — E7800 Pure hypercholesterolemia, unspecified: Secondary | ICD-10-CM | POA: Diagnosis not present

## 2018-11-03 DIAGNOSIS — G4733 Obstructive sleep apnea (adult) (pediatric): Secondary | ICD-10-CM | POA: Diagnosis not present

## 2018-11-03 DIAGNOSIS — M79605 Pain in left leg: Secondary | ICD-10-CM | POA: Diagnosis not present

## 2018-11-03 DIAGNOSIS — E7800 Pure hypercholesterolemia, unspecified: Secondary | ICD-10-CM | POA: Diagnosis not present

## 2018-11-03 DIAGNOSIS — R7301 Impaired fasting glucose: Secondary | ICD-10-CM | POA: Diagnosis not present

## 2018-11-03 DIAGNOSIS — I10 Essential (primary) hypertension: Secondary | ICD-10-CM | POA: Diagnosis not present

## 2018-11-03 DIAGNOSIS — M1612 Unilateral primary osteoarthritis, left hip: Secondary | ICD-10-CM | POA: Diagnosis not present

## 2018-11-03 DIAGNOSIS — G5712 Meralgia paresthetica, left lower limb: Secondary | ICD-10-CM | POA: Diagnosis not present

## 2019-01-23 DIAGNOSIS — B353 Tinea pedis: Secondary | ICD-10-CM | POA: Diagnosis not present

## 2019-01-23 DIAGNOSIS — Z1283 Encounter for screening for malignant neoplasm of skin: Secondary | ICD-10-CM | POA: Diagnosis not present

## 2019-01-23 DIAGNOSIS — B351 Tinea unguium: Secondary | ICD-10-CM | POA: Diagnosis not present

## 2019-01-23 DIAGNOSIS — I781 Nevus, non-neoplastic: Secondary | ICD-10-CM | POA: Diagnosis not present

## 2019-01-23 DIAGNOSIS — L814 Other melanin hyperpigmentation: Secondary | ICD-10-CM | POA: Diagnosis not present

## 2019-01-23 DIAGNOSIS — L820 Inflamed seborrheic keratosis: Secondary | ICD-10-CM | POA: Diagnosis not present

## 2019-01-23 DIAGNOSIS — D1800 Hemangioma unspecified site: Secondary | ICD-10-CM | POA: Diagnosis not present

## 2019-01-23 DIAGNOSIS — L821 Other seborrheic keratosis: Secondary | ICD-10-CM | POA: Diagnosis not present

## 2019-01-23 DIAGNOSIS — L720 Epidermal cyst: Secondary | ICD-10-CM | POA: Diagnosis not present

## 2019-01-23 DIAGNOSIS — L578 Other skin changes due to chronic exposure to nonionizing radiation: Secondary | ICD-10-CM | POA: Diagnosis not present

## 2019-03-23 DIAGNOSIS — D3131 Benign neoplasm of right choroid: Secondary | ICD-10-CM | POA: Diagnosis not present

## 2019-05-01 DIAGNOSIS — Z125 Encounter for screening for malignant neoplasm of prostate: Secondary | ICD-10-CM | POA: Diagnosis not present

## 2019-05-01 DIAGNOSIS — I10 Essential (primary) hypertension: Secondary | ICD-10-CM | POA: Diagnosis not present

## 2019-05-01 DIAGNOSIS — E7800 Pure hypercholesterolemia, unspecified: Secondary | ICD-10-CM | POA: Diagnosis not present

## 2019-05-01 DIAGNOSIS — R7301 Impaired fasting glucose: Secondary | ICD-10-CM | POA: Diagnosis not present

## 2019-05-08 DIAGNOSIS — I10 Essential (primary) hypertension: Secondary | ICD-10-CM | POA: Diagnosis not present

## 2019-05-08 DIAGNOSIS — Z0000 Encounter for general adult medical examination without abnormal findings: Secondary | ICD-10-CM | POA: Diagnosis not present

## 2019-05-08 DIAGNOSIS — E7800 Pure hypercholesterolemia, unspecified: Secondary | ICD-10-CM | POA: Diagnosis not present

## 2019-05-08 DIAGNOSIS — G4733 Obstructive sleep apnea (adult) (pediatric): Secondary | ICD-10-CM | POA: Diagnosis not present

## 2019-05-08 DIAGNOSIS — R7301 Impaired fasting glucose: Secondary | ICD-10-CM | POA: Diagnosis not present

## 2019-11-01 DIAGNOSIS — I10 Essential (primary) hypertension: Secondary | ICD-10-CM | POA: Diagnosis not present

## 2019-11-01 DIAGNOSIS — R7301 Impaired fasting glucose: Secondary | ICD-10-CM | POA: Diagnosis not present

## 2019-11-01 DIAGNOSIS — E7800 Pure hypercholesterolemia, unspecified: Secondary | ICD-10-CM | POA: Diagnosis not present

## 2019-11-07 DIAGNOSIS — G4733 Obstructive sleep apnea (adult) (pediatric): Secondary | ICD-10-CM | POA: Diagnosis not present

## 2019-11-07 DIAGNOSIS — I10 Essential (primary) hypertension: Secondary | ICD-10-CM | POA: Diagnosis not present

## 2019-11-07 DIAGNOSIS — R7301 Impaired fasting glucose: Secondary | ICD-10-CM | POA: Diagnosis not present

## 2019-11-07 DIAGNOSIS — E7800 Pure hypercholesterolemia, unspecified: Secondary | ICD-10-CM | POA: Diagnosis not present

## 2020-01-29 ENCOUNTER — Encounter: Payer: PPO | Admitting: Dermatology

## 2020-05-07 DIAGNOSIS — I10 Essential (primary) hypertension: Secondary | ICD-10-CM | POA: Diagnosis not present

## 2020-05-07 DIAGNOSIS — E7800 Pure hypercholesterolemia, unspecified: Secondary | ICD-10-CM | POA: Diagnosis not present

## 2020-05-07 DIAGNOSIS — R7301 Impaired fasting glucose: Secondary | ICD-10-CM | POA: Diagnosis not present

## 2020-05-07 DIAGNOSIS — Z125 Encounter for screening for malignant neoplasm of prostate: Secondary | ICD-10-CM | POA: Diagnosis not present

## 2020-05-14 DIAGNOSIS — R7303 Prediabetes: Secondary | ICD-10-CM | POA: Diagnosis not present

## 2020-05-14 DIAGNOSIS — I10 Essential (primary) hypertension: Secondary | ICD-10-CM | POA: Diagnosis not present

## 2020-05-14 DIAGNOSIS — Z0000 Encounter for general adult medical examination without abnormal findings: Secondary | ICD-10-CM | POA: Diagnosis not present

## 2020-05-14 DIAGNOSIS — Z23 Encounter for immunization: Secondary | ICD-10-CM | POA: Diagnosis not present

## 2020-05-14 DIAGNOSIS — G4733 Obstructive sleep apnea (adult) (pediatric): Secondary | ICD-10-CM | POA: Diagnosis not present

## 2020-05-14 DIAGNOSIS — E7800 Pure hypercholesterolemia, unspecified: Secondary | ICD-10-CM | POA: Diagnosis not present

## 2020-07-11 DIAGNOSIS — H2511 Age-related nuclear cataract, right eye: Secondary | ICD-10-CM | POA: Diagnosis not present

## 2020-11-05 DIAGNOSIS — I10 Essential (primary) hypertension: Secondary | ICD-10-CM | POA: Diagnosis not present

## 2020-11-05 DIAGNOSIS — R7303 Prediabetes: Secondary | ICD-10-CM | POA: Diagnosis not present

## 2020-11-05 DIAGNOSIS — E7800 Pure hypercholesterolemia, unspecified: Secondary | ICD-10-CM | POA: Diagnosis not present

## 2020-11-12 DIAGNOSIS — Z125 Encounter for screening for malignant neoplasm of prostate: Secondary | ICD-10-CM | POA: Diagnosis not present

## 2020-11-12 DIAGNOSIS — R7303 Prediabetes: Secondary | ICD-10-CM | POA: Diagnosis not present

## 2020-11-12 DIAGNOSIS — I10 Essential (primary) hypertension: Secondary | ICD-10-CM | POA: Diagnosis not present

## 2020-11-12 DIAGNOSIS — G4733 Obstructive sleep apnea (adult) (pediatric): Secondary | ICD-10-CM | POA: Diagnosis not present

## 2020-11-12 DIAGNOSIS — Z23 Encounter for immunization: Secondary | ICD-10-CM | POA: Diagnosis not present

## 2020-11-12 DIAGNOSIS — E7800 Pure hypercholesterolemia, unspecified: Secondary | ICD-10-CM | POA: Diagnosis not present

## 2021-04-21 ENCOUNTER — Ambulatory Visit: Payer: PPO | Admitting: Podiatry

## 2021-04-23 ENCOUNTER — Ambulatory Visit: Payer: PPO | Admitting: Podiatry

## 2021-04-23 ENCOUNTER — Ambulatory Visit (INDEPENDENT_AMBULATORY_CARE_PROVIDER_SITE_OTHER): Payer: PPO

## 2021-04-23 ENCOUNTER — Encounter: Payer: Self-pay | Admitting: Podiatry

## 2021-04-23 ENCOUNTER — Ambulatory Visit: Payer: PPO

## 2021-04-23 ENCOUNTER — Other Ambulatory Visit: Payer: Self-pay

## 2021-04-23 DIAGNOSIS — M722 Plantar fascial fibromatosis: Secondary | ICD-10-CM | POA: Diagnosis not present

## 2021-04-23 DIAGNOSIS — M7750 Other enthesopathy of unspecified foot: Secondary | ICD-10-CM

## 2021-04-23 DIAGNOSIS — M7752 Other enthesopathy of left foot: Secondary | ICD-10-CM

## 2021-04-23 NOTE — Patient Instructions (Signed)

## 2021-04-27 NOTE — Progress Notes (Signed)
  Subjective:  Patient ID: Cody Caraway Sr., male    DOB: [birth year],  MRN: [id number]  Chief Complaint  Patient presents with   Plantar Fasciitis    New pt- Right foot planter fasciitis getting worse very painful. Patient would like cortizone shot    80 y.o. male presents with the above complaint. History confirmed with patient.  Previously has had this and saw Dr. Milinda Pointer previously who did an injection been going on for about 3 weeks now.  His left ankle is very stiff and causes pain he previously fractured that leg  Objective:  Physical Exam: warm, good capillary refill, no trophic changes or ulcerative lesions, normal DP and PT pulses, and normal sensory exam. Left Foot:  Pain and swelling around the anterior ankle with limited range of motion Right Foot: point tenderness over the heel pad  No images are attached to the encounter.  Radiographs: Multiple views x-ray of left ankle and both feet: Arthritic changes around the left ankle with anterior spurring and joint space narrowing, on the right foot he has a plantar calcaneal spur Assessment:   1. Plantar fasciitis, right   2. Tendonitis of ankle or foot      Plan:  Patient was evaluated and treated and all questions answered.  Reviewed his ankle radiographs with him and discussed he has posttraumatic ankle arthritis.  Currently this is minimally bothersome.  I discussed injection therapy with him if this continues to worsen.  He will call and see [place] as needed for this.  Discussed the etiology and treatment options for plantar fasciitis including stretching, formal physical therapy, supportive shoegears such as a running shoe or sneaker, pre fabricated orthoses, injection therapy, and oral medications. We also discussed the role of surgical treatment of this for patients who do not improve after exhausting non-surgical treatment options.   -XR reviewed with patient -Educated patient on stretching and icing of the affected  limb -Injection delivered to the plantar fascia of the right foot.  After sterile prep with povidone-iodine solution and alcohol, the right heel was injected with 0.5cc 2% xylocaine plain, 0.5cc 0.5% marcaine plain, 5mg  triamcinolone acetonide, and 2mg  dexamethasone was injected along the medial plantar fascia at the insertion on the plantar calcaneus. The patient tolerated the procedure well without complication.  Return if symptoms worsen or fail to improve.

## 2021-05-12 DIAGNOSIS — R7303 Prediabetes: Secondary | ICD-10-CM | POA: Diagnosis not present

## 2021-05-12 DIAGNOSIS — Z125 Encounter for screening for malignant neoplasm of prostate: Secondary | ICD-10-CM | POA: Diagnosis not present

## 2021-05-12 DIAGNOSIS — I10 Essential (primary) hypertension: Secondary | ICD-10-CM | POA: Diagnosis not present

## 2021-05-19 DIAGNOSIS — G5712 Meralgia paresthetica, left lower limb: Secondary | ICD-10-CM | POA: Diagnosis not present

## 2021-05-19 DIAGNOSIS — Z0000 Encounter for general adult medical examination without abnormal findings: Secondary | ICD-10-CM | POA: Diagnosis not present

## 2021-05-19 DIAGNOSIS — I10 Essential (primary) hypertension: Secondary | ICD-10-CM | POA: Diagnosis not present

## 2021-05-19 DIAGNOSIS — E7800 Pure hypercholesterolemia, unspecified: Secondary | ICD-10-CM | POA: Diagnosis not present

## 2021-05-19 DIAGNOSIS — M79671 Pain in right foot: Secondary | ICD-10-CM | POA: Diagnosis not present

## 2021-05-19 DIAGNOSIS — I493 Ventricular premature depolarization: Secondary | ICD-10-CM | POA: Diagnosis not present

## 2021-05-19 DIAGNOSIS — R7303 Prediabetes: Secondary | ICD-10-CM | POA: Diagnosis not present

## 2021-06-09 DIAGNOSIS — Z87442 Personal history of urinary calculi: Secondary | ICD-10-CM | POA: Diagnosis not present

## 2021-06-09 DIAGNOSIS — M545 Low back pain, unspecified: Secondary | ICD-10-CM | POA: Diagnosis not present

## 2021-06-09 DIAGNOSIS — M47816 Spondylosis without myelopathy or radiculopathy, lumbar region: Secondary | ICD-10-CM | POA: Diagnosis not present

## 2021-06-09 DIAGNOSIS — M8588 Other specified disorders of bone density and structure, other site: Secondary | ICD-10-CM | POA: Diagnosis not present

## 2021-06-11 DIAGNOSIS — R001 Bradycardia, unspecified: Secondary | ICD-10-CM | POA: Diagnosis not present

## 2021-06-11 DIAGNOSIS — R0902 Hypoxemia: Secondary | ICD-10-CM | POA: Diagnosis not present

## 2021-06-11 DIAGNOSIS — R0602 Shortness of breath: Secondary | ICD-10-CM | POA: Diagnosis not present

## 2021-06-12 ENCOUNTER — Other Ambulatory Visit: Payer: Self-pay

## 2021-06-12 ENCOUNTER — Emergency Department: Payer: PPO

## 2021-06-12 ENCOUNTER — Encounter: Payer: Self-pay | Admitting: Emergency Medicine

## 2021-06-12 ENCOUNTER — Inpatient Hospital Stay
Admission: EM | Admit: 2021-06-12 | Discharge: 2021-06-13 | DRG: 286 | Disposition: A | Discharge status: Home / Self Care | Payer: PPO | Attending: Internal Medicine | Admitting: Internal Medicine

## 2021-06-12 ENCOUNTER — Inpatient Hospital Stay
Admit: 2021-06-12 | Discharge: 2021-06-12 | Disposition: A | Discharge status: Home / Self Care | Payer: PPO | Attending: Emergency Medicine | Admitting: Emergency Medicine

## 2021-06-12 DIAGNOSIS — Z91199 Patient's noncompliance with other medical treatment and regimen due to unspecified reason: Secondary | ICD-10-CM

## 2021-06-12 DIAGNOSIS — R002 Palpitations: Secondary | ICD-10-CM

## 2021-06-12 DIAGNOSIS — I110 Hypertensive heart disease with heart failure: Principal | ICD-10-CM | POA: Diagnosis present

## 2021-06-12 DIAGNOSIS — I420 Dilated cardiomyopathy: Secondary | ICD-10-CM | POA: Diagnosis not present

## 2021-06-12 DIAGNOSIS — I509 Heart failure, unspecified: Secondary | ICD-10-CM

## 2021-06-12 DIAGNOSIS — J209 Acute bronchitis, unspecified: Secondary | ICD-10-CM

## 2021-06-12 DIAGNOSIS — K761 Chronic passive congestion of liver: Secondary | ICD-10-CM | POA: Diagnosis present

## 2021-06-12 DIAGNOSIS — R7303 Prediabetes: Secondary | ICD-10-CM | POA: Diagnosis not present

## 2021-06-12 DIAGNOSIS — E7800 Pure hypercholesterolemia, unspecified: Secondary | ICD-10-CM | POA: Diagnosis not present

## 2021-06-12 DIAGNOSIS — Z6828 Body mass index (BMI) 28.0-28.9, adult: Secondary | ICD-10-CM | POA: Diagnosis not present

## 2021-06-12 DIAGNOSIS — M5430 Sciatica, unspecified side: Secondary | ICD-10-CM | POA: Diagnosis present

## 2021-06-12 DIAGNOSIS — Z79899 Other long term (current) drug therapy: Secondary | ICD-10-CM

## 2021-06-12 DIAGNOSIS — Z87442 Personal history of urinary calculi: Secondary | ICD-10-CM | POA: Diagnosis not present

## 2021-06-12 DIAGNOSIS — R7401 Elevation of levels of liver transaminase levels: Secondary | ICD-10-CM

## 2021-06-12 DIAGNOSIS — I5033 Acute on chronic diastolic (congestive) heart failure: Secondary | ICD-10-CM | POA: Diagnosis not present

## 2021-06-12 DIAGNOSIS — I10 Essential (primary) hypertension: Secondary | ICD-10-CM | POA: Diagnosis not present

## 2021-06-12 DIAGNOSIS — E785 Hyperlipidemia, unspecified: Secondary | ICD-10-CM

## 2021-06-12 DIAGNOSIS — J9601 Acute respiratory failure with hypoxia: Secondary | ICD-10-CM | POA: Diagnosis not present

## 2021-06-12 DIAGNOSIS — K760 Fatty (change of) liver, not elsewhere classified: Secondary | ICD-10-CM | POA: Diagnosis not present

## 2021-06-12 DIAGNOSIS — Z7982 Long term (current) use of aspirin: Secondary | ICD-10-CM

## 2021-06-12 DIAGNOSIS — Z7952 Long term (current) use of systemic steroids: Secondary | ICD-10-CM

## 2021-06-12 DIAGNOSIS — Z880 Allergy status to penicillin: Secondary | ICD-10-CM

## 2021-06-12 DIAGNOSIS — I493 Ventricular premature depolarization: Secondary | ICD-10-CM | POA: Diagnosis not present

## 2021-06-12 DIAGNOSIS — I5021 Acute systolic (congestive) heart failure: Secondary | ICD-10-CM | POA: Diagnosis present

## 2021-06-12 DIAGNOSIS — I428 Other cardiomyopathies: Secondary | ICD-10-CM | POA: Diagnosis not present

## 2021-06-12 DIAGNOSIS — R0602 Shortness of breath: Secondary | ICD-10-CM | POA: Diagnosis not present

## 2021-06-12 DIAGNOSIS — G4733 Obstructive sleep apnea (adult) (pediatric): Secondary | ICD-10-CM | POA: Diagnosis not present

## 2021-06-12 DIAGNOSIS — J440 Chronic obstructive pulmonary disease with acute lower respiratory infection: Secondary | ICD-10-CM | POA: Diagnosis present

## 2021-06-12 DIAGNOSIS — N400 Enlarged prostate without lower urinary tract symptoms: Secondary | ICD-10-CM | POA: Diagnosis present

## 2021-06-12 DIAGNOSIS — Z20822 Contact with and (suspected) exposure to covid-19: Secondary | ICD-10-CM | POA: Diagnosis present

## 2021-06-12 DIAGNOSIS — J90 Pleural effusion, not elsewhere classified: Secondary | ICD-10-CM | POA: Diagnosis not present

## 2021-06-12 DIAGNOSIS — E669 Obesity, unspecified: Secondary | ICD-10-CM | POA: Diagnosis present

## 2021-06-12 DIAGNOSIS — R0603 Acute respiratory distress: Secondary | ICD-10-CM | POA: Diagnosis not present

## 2021-06-12 DIAGNOSIS — G500 Trigeminal neuralgia: Secondary | ICD-10-CM | POA: Diagnosis present

## 2021-06-12 LAB — COMPREHENSIVE METABOLIC PANEL
ALT: 53 U/L — ABNORMAL HIGH (ref 0–44)
AST: 53 U/L — ABNORMAL HIGH (ref 15–41)
Albumin: 3.9 g/dL (ref 3.5–5.0)
Alkaline Phosphatase: 46 U/L (ref 38–126)
Anion gap: 7 (ref 5–15)
BUN: 35 mg/dL — ABNORMAL HIGH (ref 8–23)
CO2: 22 mmol/L (ref 22–32)
Calcium: 8.7 mg/dL — ABNORMAL LOW (ref 8.9–10.3)
Chloride: 107 mmol/L (ref 98–111)
Creatinine, Ser: 0.88 mg/dL (ref 0.61–1.24)
GFR, Estimated: >60 mL/min (ref >60)
Glucose, Bld: 101 mg/dL — ABNORMAL HIGH (ref 70–99)
Potassium: 4.1 mmol/L (ref 3.5–5.1)
Sodium: 136 mmol/L (ref 135–145)
Total Bilirubin: 1.6 mg/dL — ABNORMAL HIGH (ref 0.3–1.2)
Total Protein: 7 g/dL (ref 6.5–8.1)

## 2021-06-12 LAB — RESPIRATORY PANEL BY PCR

## 2021-06-12 LAB — TROPONIN I (HIGH SENSITIVITY)
Troponin I (High Sensitivity): 16 ng/L (ref <18)
Troponin I (High Sensitivity): 17 ng/L (ref <18)

## 2021-06-12 LAB — PROTIME-INR
INR: 1 (ref 0.8–1.2)
Prothrombin Time: 13.3 seconds (ref 11.4–15.2)

## 2021-06-12 LAB — RESP PANEL BY RT-PCR (FLU A&B, COVID) ARPGX2
Influenza A by PCR: NEGATIVE
Influenza B by PCR: NEGATIVE
SARS Coronavirus 2 by RT PCR: NEGATIVE

## 2021-06-12 LAB — URINALYSIS, COMPLETE (UACMP) WITH MICROSCOPIC
Bacteria, UA: NONE SEEN
Bilirubin Urine: NEGATIVE
Glucose, UA: NEGATIVE mg/dL
Hgb urine dipstick: NEGATIVE
Ketones, ur: NEGATIVE mg/dL
Leukocytes,Ua: NEGATIVE
Nitrite: NEGATIVE
Protein, ur: NEGATIVE mg/dL
Specific Gravity, Urine: <1.005 — ABNORMAL LOW (ref 1.005–1.030)
pH: 5 (ref 5.0–8.0)

## 2021-06-12 LAB — ECHOCARDIOGRAM COMPLETE
AR max vel: 1.85 cm2
AV Area VTI: 1.84 cm2
AV Area mean vel: 1.77 cm2
AV Mean grad: 6 mmHg
AV Peak grad: 9.9 mmHg
Ao pk vel: 1.57 m/s
Area-P 1/2: 6.12 cm2
Height: 71 in
MV VTI: 2.04 cm2
S' Lateral: 5.53 cm
Weight: 3601.43 oz

## 2021-06-12 LAB — CBC
HCT: 39.4 % (ref 39.0–52.0)
Hemoglobin: 12.8 g/dL — ABNORMAL LOW (ref 13.0–17.0)
MCH: 29.3 pg (ref 26.0–34.0)
MCHC: 32.5 g/dL (ref 30.0–36.0)
MCV: 90.2 fL (ref 80.0–100.0)
Platelets: 187 10*3/uL (ref 150–400)
RBC: 4.37 MIL/uL (ref 4.22–5.81)
RDW: 15 % (ref 11.5–15.5)
WBC: 7.7 10*3/uL (ref 4.0–10.5)
nRBC: 0 % (ref 0.0–0.2)

## 2021-06-12 LAB — D-DIMER, QUANTITATIVE: D-Dimer, Quant: 0.64 ug/mL-FEU — ABNORMAL HIGH (ref 0.00–0.50)

## 2021-06-12 LAB — LACTIC ACID, PLASMA
Lactic Acid, Venous: 1.3 mmol/L (ref 0.5–1.9)
Lactic Acid, Venous: 1.6 mmol/L (ref 0.5–1.9)

## 2021-06-12 LAB — BRAIN NATRIURETIC PEPTIDE: B Natriuretic Peptide: 904.2 pg/mL — ABNORMAL HIGH (ref 0.0–100.0)

## 2021-06-12 LAB — MAGNESIUM: Magnesium: 2.1 mg/dL (ref 1.7–2.4)

## 2021-06-12 LAB — PROCALCITONIN: Procalcitonin: <0.1 ng/mL

## 2021-06-12 LAB — APTT: aPTT: 31 seconds (ref 24–36)

## 2021-06-12 MED ORDER — ROSUVASTATIN CALCIUM 10 MG PO TABS
10.0000 mg | ORAL_TABLET | Freq: Every day | ORAL | Status: DC
  Administered 2021-06-12 – 2021-06-14 (×2): 10 mg via ORAL
  Filled 2021-06-12 (×3): qty 1

## 2021-06-12 MED ORDER — ENOXAPARIN SODIUM 60 MG/0.6ML IJ SOSY
0.5000 mg/kg | PREFILLED_SYRINGE | INTRAMUSCULAR | Status: DC
  Administered 2021-06-12: 08:00:00 50 mg via SUBCUTANEOUS
  Filled 2021-06-12: qty 0.6

## 2021-06-12 MED ORDER — HYDROCODONE-ACETAMINOPHEN 5-325 MG PO TABS: 1.0000 | ORAL_TABLET | Freq: Four times a day (QID) | ORAL | Status: DC | PRN

## 2021-06-12 MED ORDER — LISINOPRIL 10 MG PO TABS
10.0000 mg | ORAL_TABLET | Freq: Every day | ORAL | Status: DC
  Administered 2021-06-12: 10:00:00 10 mg via ORAL
  Filled 2021-06-12: qty 1

## 2021-06-12 MED ORDER — SPIRONOLACTONE 25 MG PO TABS
12.5000 mg | ORAL_TABLET | Freq: Every day | ORAL | Status: DC
  Administered 2021-06-12 – 2021-06-14 (×2): 12.5 mg via ORAL
  Filled 2021-06-12 (×3): qty 0.5

## 2021-06-12 MED ORDER — PREDNISONE 20 MG PO TABS
20.0000 mg | ORAL_TABLET | Freq: Two times a day (BID) | ORAL | Status: DC
  Administered 2021-06-12 (×2): 20 mg via ORAL
  Filled 2021-06-12 (×2): qty 1

## 2021-06-12 MED ORDER — SODIUM CHLORIDE 0.9% FLUSH
3.0000 mL | Freq: Two times a day (BID) | INTRAVENOUS | Status: DC
  Administered 2021-06-12 – 2021-06-14 (×3): 3 mL via INTRAVENOUS

## 2021-06-12 MED ORDER — ONDANSETRON HCL 4 MG/2ML IJ SOLN
4.0000 mg | Freq: Four times a day (QID) | INTRAMUSCULAR | Status: DC | PRN
  Administered 2021-06-12: 22:00:00 4 mg via INTRAVENOUS
  Filled 2021-06-12: qty 2

## 2021-06-12 MED ORDER — ACETAMINOPHEN 325 MG PO TABS: 650.0000 mg | ORAL_TABLET | ORAL | Status: DC | PRN

## 2021-06-12 MED ORDER — METHYLPREDNISOLONE SODIUM SUCC 125 MG IJ SOLR: 125.0000 mg | Freq: Once | INTRAMUSCULAR | Status: DC

## 2021-06-12 MED ORDER — SODIUM CHLORIDE 0.9% FLUSH
3.0000 mL | Freq: Two times a day (BID) | INTRAVENOUS | Status: DC
  Administered 2021-06-12 – 2021-06-14 (×2): 3 mL via INTRAVENOUS

## 2021-06-12 MED ORDER — FUROSEMIDE 10 MG/ML IJ SOLN
40.0000 mg | Freq: Two times a day (BID) | INTRAMUSCULAR | Status: DC
  Administered 2021-06-12 – 2021-06-13 (×2): 40 mg via INTRAVENOUS
  Filled 2021-06-12 (×3): qty 4

## 2021-06-12 MED ORDER — SODIUM CHLORIDE 0.9% FLUSH: 3.0000 mL | INTRAVENOUS | Status: DC | PRN

## 2021-06-12 MED ORDER — LOSARTAN POTASSIUM 50 MG PO TABS: 50.0000 mg | ORAL_TABLET | Freq: Every day | ORAL | Status: DC

## 2021-06-12 MED ORDER — IPRATROPIUM-ALBUTEROL 0.5-2.5 (3) MG/3ML IN SOLN
9.0000 mL | Freq: Once | RESPIRATORY_TRACT | Status: AC
  Administered 2021-06-12: 01:00:00 9 mL via RESPIRATORY_TRACT
  Filled 2021-06-12: qty 9

## 2021-06-12 MED ORDER — FUROSEMIDE 10 MG/ML IJ SOLN
40.0000 mg | Freq: Once | INTRAMUSCULAR | Status: AC
  Administered 2021-06-12: 02:00:00 40 mg via INTRAVENOUS
  Filled 2021-06-12: qty 4

## 2021-06-12 MED ORDER — IPRATROPIUM-ALBUTEROL 0.5-2.5 (3) MG/3ML IN SOLN: 3.0000 mL | RESPIRATORY_TRACT | Status: DC | PRN

## 2021-06-12 MED ORDER — FUROSEMIDE 10 MG/ML IJ SOLN
40.0000 mg | Freq: Once | INTRAMUSCULAR | Status: AC
  Administered 2021-06-12: 03:00:00 40 mg via INTRAVENOUS
  Filled 2021-06-12: qty 4

## 2021-06-12 MED ORDER — CARVEDILOL 6.25 MG PO TABS
6.2500 mg | ORAL_TABLET | Freq: Two times a day (BID) | ORAL | Status: DC
  Administered 2021-06-12 – 2021-06-14 (×5): 6.25 mg via ORAL
  Filled 2021-06-12 (×5): qty 1

## 2021-06-12 MED ORDER — SODIUM CHLORIDE 0.9 % IV SOLN: 250.0000 mL | INTRAVENOUS | Status: DC | PRN

## 2021-06-12 MED ORDER — ASPIRIN EC 81 MG PO TBEC
81.0000 mg | DELAYED_RELEASE_TABLET | Freq: Every day | ORAL | Status: DC
  Administered 2021-06-12: 10:00:00 81 mg via ORAL
  Filled 2021-06-12: qty 1

## 2021-06-12 NOTE — Consult Note (Signed)
CARDIOLOGY CONSULT NOTE               Patient ID: Cody Caraway Sr. MRN: [id number] DOB/AGE: 12-11-1940 80 y.o.  Admit date: 06/12/2021 Referring Physician Dr. Judd Gaudier Primary Physician Dr. Frazier Richards Primary Cardiologist Dr. Ubaldo Glassing Reason for Consultation shortness of breath dyspnea heart failure type symptoms  HPI: Patient is a 80 year old male history of hypertension obstructive sleep apnea prediabetes palpitation PVCs COPD obesity reportedly has obstructive sleep apnea has not been compliant with his CPAP.  Patient describes progressive dyspnea shortness of breath with minimal activity significant hypoxemia.  Current shortness of breath chest pain poorly had O2 sats in the 80s minimal leg edema shortness of breath thought that he may have chills.  Patient has a chronic lower extremity edema Most symptoms have improved received diuretics which seem to help no blackout spells or syncope  Review of systems complete and found to be negative unless listed above     Past Medical History:  Diagnosis Date   BPH (benign prostatic hyperplasia)    Complication of anesthesia    Edema    MILD FEET/ANKLES OCCAS   History of kidney stones    Hyperlipidemia    Hypertension    Neuromuscular disorder (HCC)    TRIGEMINAL NEURALGIA   Palpitations    PONV (postoperative nausea and vomiting)    Sleep apnea    DOES NOT USE CPAP   Vertigo     Past Surgical History:  Procedure Laterality Date   CATARACT EXTRACTION W/PHACO Left 09/23/2017   Procedure: CATARACT EXTRACTION PHACO AND INTRAOCULAR LENS PLACEMENT ([place]);  Surgeon: Eulogio Bear, MD;  Location: [facility] ORS;  Service: Ophthalmology;  Laterality: Left;  [place] 00:19 AP% 8.5 CDE 1.64 Fluid pak lot # [id number]   COLONOSCOPY WITH PROPOFOL N/A 05/06/2016   Procedure: COLONOSCOPY WITH PROPOFOL;  Surgeon: Manya Silvas, MD;  Location: [facility] ENDOSCOPY;  Service: Endoscopy;  Laterality: N/A;   HERNIA REPAIR      (Not in a  hospital admission)  Social History   Socioeconomic History   Marital status: Married    Spouse name: Not on file   Number of children: Not on file   Years of education: Not on file   Highest education level: Not on file  Occupational History   Not on file  Tobacco Use   Smoking status: Never   Smokeless tobacco: Never  Vaping Use   Vaping Use: Never used  Substance and Sexual Activity   Alcohol use: No   Drug use: No   Sexual activity: Not on file  Other Topics Concern   Not on file  Social History Narrative   Not on file   Social Determinants of Health   Financial Resource Strain: Not on file  Food Insecurity: Not on file  Transportation Needs: Not on file  Physical Activity: Not on file  Stress: Not on file  Social Connections: Not on file  Intimate Partner Violence: Not on file    History reviewed. No pertinent family history.    Review of systems complete and found to be negative unless listed above      PHYSICAL EXAM  General: Well developed, well nourished, in no acute distress HEENT:  Normocephalic and atramatic Neck:  No JVD.  Lungs: Clear bilaterally to auscultation and percussion. Heart: HRRR . Normal S1 and S2 without gallops or murmurs.  Abdomen: Bowel sounds are positive, abdomen soft and non-tender  Msk:  Back normal, normal gait. Normal strength and  tone for age. Extremities: No clubbing, cyanosis or edema.   Neuro: Alert and oriented X 3. Psych:  Good affect, responds appropriately  Labs:   Lab Results  Component Value Date   WBC 7.7 06/12/2021   HGB 12.8 (L) 06/12/2021   HCT 39.4 06/12/2021   MCV 90.2 06/12/2021   PLT 187 06/12/2021    Recent Labs  Lab 06/12/21 0039  NA 136  K 4.1  CL 107  CO2 22  BUN 35*  CREATININE 0.88  CALCIUM 8.7*  PROT 7.0  BILITOT 1.6*  ALKPHOS 46  ALT 53*  AST 53*  GLUCOSE 101*   Lab Results  Component Value Date   CKTOTAL 121 02/07/2013   CKMB 1.0 02/07/2013   TROPONINI 0.05 02/07/2013    No results found for: CHOL No results found for: HDL No results found for: LDLCALC No results found for: TRIG No results found for: CHOLHDL No results found for: LDLDIRECT    Radiology: DG Chest Portable 1 View  Result Date: 06/12/2021 CLINICAL DATA:  Shortness of breath. EXAM: PORTABLE CHEST 1 VIEW COMPARISON:  None. FINDINGS: Moderate to marked severity diffusely increased interstitial lung markings are seen bilaterally. Mild areas of atelectasis and/or infiltrate are seen along the bilateral infrahilar regions. There is no evidence of a pleural effusion or pneumothorax. The cardiac silhouette is mildly enlarged. The visualized skeletal structures are unremarkable. IMPRESSION: 1. Moderate to marked severity interstitial edema. 2. Mild bilateral infrahilar atelectasis and/or infiltrate. Electronically Signed   By: Virgina Norfolk M.D.   On: 06/12/2021 00:55   ECHOCARDIOGRAM COMPLETE  Result Date: 06/12/2021    ECHOCARDIOGRAM REPORT   Patient Name:   Cody MCINTURFF Sr. Date of Exam: 06/12/2021 Medical Rec #:  [id number]              Height:       71.0 in Accession #:    [id number]             Weight:       225.1 lb Date of Birth:  [birth year]               BSA:          2.217 m Patient Age:    80 years               BP:           147/85 mmHg Patient Gender: M                      HR:           73 bpm. Exam Location:  [facility] Procedure: 2D Echo, Color Doppler and Cardiac Doppler Indications:     R06.03 Acute respiratory distress  History:         Patient has no prior history of Echocardiogram examinations.                  Risk Factors:Hypertension, Dyslipidemia and Sleep Apnea.  Sonographer:     Charmayne Sheer Referring Phys:  [id number] Ida Rogue SMITH Diagnosing Phys: Yolonda Kida MD  Sonographer Comments: Suboptimal subcostal window. IMPRESSIONS  1. Left ventricular ejection fraction, by estimation, is 25 to 30%. The left ventricle has severely decreased function. The left ventricle demonstrates  global hypokinesis. The left ventricular internal cavity size was severely dilated. Left ventricular diastolic parameters are consistent with Grade I diastolic dysfunction (impaired relaxation).  2. Right ventricular systolic function is low normal. The right ventricular  size is normal.  3. Left atrial size was mildly dilated.  4. Right atrial size was mildly dilated.  5. The mitral valve is normal in structure. Trivial mitral valve regurgitation.  6. The aortic valve is normal in structure. Aortic valve regurgitation is not visualized. FINDINGS  Left Ventricle: Left ventricular ejection fraction, by estimation, is 25 to 30%. The left ventricle has severely decreased function. The left ventricle demonstrates global hypokinesis. The left ventricular internal cavity size was severely dilated. There is no left ventricular hypertrophy. Left ventricular diastolic parameters are consistent with Grade I diastolic dysfunction (impaired relaxation). Right Ventricle: The right ventricular size is normal. No increase in right ventricular wall thickness. Right ventricular systolic function is low normal. Left Atrium: Left atrial size was mildly dilated. Right Atrium: Right atrial size was mildly dilated. Pericardium: There is no evidence of pericardial effusion. Mitral Valve: The mitral valve is normal in structure. Trivial mitral valve regurgitation. MV peak gradient, 6.5 mmHg. The mean mitral valve gradient is 3.5 mmHg. Tricuspid Valve: The tricuspid valve is normal in structure. Tricuspid valve regurgitation is mild. Aortic Valve: The aortic valve is normal in structure. Aortic valve regurgitation is not visualized. Aortic valve mean gradient measures 6.0 mmHg. Aortic valve peak gradient measures 9.9 mmHg. Aortic valve area, by VTI measures 1.84 cm. Pulmonic Valve: The pulmonic valve was grossly normal. Pulmonic valve regurgitation is not visualized. Aorta: The ascending aorta was not well visualized. IAS/Shunts: No atrial  level shunt detected by color flow Doppler. Additional Comments: There is no pleural effusion.  LEFT VENTRICLE PLAX 2D LVIDd:         6.42 cm   Diastology LVIDs:         5.53 cm   LV e' medial:    4.46 cm/s LV PW:         1.15 cm   LV E/e' medial:  23.6 LV IVS:        0.94 cm   LV e' lateral:   11.60 cm/s LVOT diam:     2.00 cm   LV E/e' lateral: 9.1 LV SV:         55 LV SV Index:   25 LVOT Area:     3.14 cm  RIGHT VENTRICLE RV Basal diam:  2.82 cm RV S prime:     16.60 cm/s LEFT ATRIUM             Index        RIGHT ATRIUM           Index LA diam:        4.90 cm 2.21 cm/m   RA Area:     16.10 cm LA Vol (A2C):   73.0 ml 32.92 ml/m  RA Volume:   38.60 ml  17.41 ml/m LA Vol (A4C):   43.9 ml 19.80 ml/m LA Biplane Vol: 57.7 ml 26.02 ml/m  AORTIC VALVE                     PULMONIC VALVE AV Area (Vmax):    1.85 cm      PV Vmax:       1.25 m/s AV Area (Vmean):   1.77 cm      PV Vmean:      78.900 cm/s AV Area (VTI):     1.84 cm      PV VTI:        0.183 m AV Vmax:  157.00 cm/s   PV Peak grad:  6.2 mmHg AV Vmean:          114.000 cm/s  PV Mean grad:  3.0 mmHg AV VTI:            0.301 m AV Peak Grad:      9.9 mmHg AV Mean Grad:      6.0 mmHg LVOT Vmax:         92.40 cm/s LVOT Vmean:        64.100 cm/s LVOT VTI:          0.176 m LVOT/AV VTI ratio: 0.58  AORTA Ao Root diam: 3.10 cm MITRAL VALVE MV Area (PHT): 6.12 cm     SHUNTS MV Area VTI:   2.04 cm     Systemic VTI:  0.18 m MV Peak grad:  6.5 mmHg     Systemic Diam: 2.00 cm MV Mean grad:  3.5 mmHg MV Vmax:       1.27 m/s MV Vmean:      90.4 cm/s MV Decel Time: 124 msec MV E velocity: 105.20 cm/s MV A velocity: 119.50 cm/s MV E/A ratio:  0.88 Siddharth Babington D Lior Cartelli MD Electronically signed by Yolonda Kida MD Signature Date/Time: 06/12/2021/2:15:05 PM    Final    US ABDOMEN LIMITED RUQ (LIVER/GB)  Result Date: 06/12/2021 CLINICAL DATA:  Bilirubinemia. EXAM: ULTRASOUND ABDOMEN LIMITED RIGHT UPPER QUADRANT COMPARISON:  None. FINDINGS: Gallbladder: No  gallstones or wall thickening visualized (2.6 mm). No sonographic Murphy sign noted by sonographer. Common bile duct: Diameter: 3.8 mm Liver: No focal lesion identified. Mildly increased echogenicity of the liver parenchyma is noted. Portal vein is patent on color Doppler imaging with normal direction of blood flow towards the liver. Other: There is a small right pleural effusion. IMPRESSION: 1. Hepatic steatosis without focal liver lesions. 2. Small right pleural effusion. Electronically Signed   By: Virgina Norfolk M.D.   On: 06/12/2021 03:33    EKG: Normal sinus rhythm nonspecific ST-T changes  ASSESSMENT AND PLAN:  Congestive heart failure systolic dysfunction acute Cardiomyopathy systolic ejection fraction 20 to 25% Shortness of breath Hypertension Obesity Obstructive sleep apnea COPD Hyper lipidemia Hypertension Hypoxemia . Plan Agree with telemetry continue current monitoring Agree with following up with troponins and EKGs Continue IV diuretic therapy with Lasix up to twice a day Recommend switching from ACE to ARB and then probably subsequently to [facility] Agree with beta-blockade therapy continue Coreg Continue Crestor therapy hyperlipidemia Recommend adding low-dose spironolactone for heart failure management Inhalers as necessary for COPD shortness of breath dyspnea Follow-up BMP Agree with EKG still very depressed left ventricular function and left ventricular enlargement Obesity recommend weight loss exercise portion control Recommend repeat sleep study CPAP weight loss more aggressive attempt at adherence to CPAP Supplemental oxygen as necessary for hypoxemia  Signed: Yolonda Kida MD 06/12/2021, 10:36 PM

## 2021-06-12 NOTE — ED Notes (Signed)
Introduced self to pt for first time. Pt resting comfortably in bed at this time. Pt is alert and oriented with even and regular respirations. No acute distress noted. Pt denies any needs at this time. Call light within reach. Family remains at bedside.

## 2021-06-12 NOTE — ED Notes (Signed)
C/o of nausea. Medication will be given

## 2021-06-12 NOTE — Progress Notes (Signed)
*  PRELIMINARY RESULTS* Echocardiogram 2D Echocardiogram has been performed.  Cody Pollard 06/12/2021, 1:25 PM

## 2021-06-12 NOTE — Consult Note (Signed)
Brief cardiology consult note  Impression Congestive heart failure acute Cardiomyopathy systolic EF =27 to 63% Shortness of breath Hypertension Obesity Obstructive sleep apnea COPD Hypoxemia Hyperlipidemia Hepatic congestion  . Plan Agree with admit to telemetry Follow-up EKGs troponins Continue IV diuretic therapy with Lasix Recommend increasing ACE ARB We will switch to losartan from lisinopril in anticipation of switching to Entresto ultimately Continue Crestor therapy for lipid management Inhalers as needed for shortness of breath Will start low-dose spironolactone Recommend Coreg twice a day Agree with echocardiogram which showed depressed left ventricular function cardiomyopathy Continue hypertension management Patient needs stricter obstructive sleep apnea management Recommend right and left heart cardiac cath for new onset heart failure

## 2021-06-12 NOTE — ED Triage Notes (Signed)
Pt arrived via [facility] from home with reports of SOB that started Wednesday, pt has hx of irregular heart beat. Initial room air sats was 84%, up to 88% with 6L with EMS.given 1 duoneb sats with neb were 90%  Pt reports feeling better after neb. No hx of breathing problems, home covid test was negative.  Pt on 6L in ED was up 89%.   RT notified on arrival.   138/72

## 2021-06-12 NOTE — H&P (Addendum)
History and Physical    Cody Pollard TIW:[id number] DOB: [birth year] DOA: 06/12/2021  PCP: Kirk Ruths, MD   Patient coming from: home  I have personally briefly reviewed patient's relevant medical records in [place]  Chief Complaint: shortness of breath  HPI: Cody BARNEY Sr. is a 80 y.o. male with medical history significant for HTN, OSA, prediabetes, palpitations/PVCs, BPH, who presented to the ED acute onset shortness of breath.  He denies a history of asthma or COPD and denies tobacco use.  He is unable to tolerate CPAP.  States for the past several months he occasionally has a bit of a wheeze when he lies down to go to bed but it usually goes away on its own.  On the night of arrival he had 1 of these brief episodes and after he laid down to sleep, within 15 minutes he woke up struggling to breathe and called EMS.  Upon their arrival, O2 sat was 84% on room air requiring 6 L to maintain sats around 91.  He received duo nebs in route Patient denies chest pain.  He denies lower extremity pain.  He has chronic trace lower extremity edema which has been stable for the past 3 years.,  Denies fever or chills, COVID or flu exposure.  Denies nausea, vomiting, abdominal pain, diarrhea.  Was recently treated for sciatica on 12/19 and is currently on oral prednisone ED course:   Review of Systems: As per HPI otherwise all other systems on review of systems negative.     Acute respiratory failure with hypoxia (HCC) -Suspect a combination of acute bronchitis and acute CHF - D-dimer slightly elevated at 0.64, procalcitonin less than 0.1 - BiPAP and wean to O2 as tolerated - Treat acute conditions    Acute CHF (congestive heart failure) (HCC) - Chest x-ray with interstitial edema and BNP over 900 - Etiology possibly viral, possible uncontrolled hypertension. - Cardiac monitoring to evaluate for arrhythmias - Continue to trend troponin - IV Lasix, ACE inhibitor for  BP control with hydralazine as needed - Daily weights with intake and output monitoring - Echocardiogram in the a.m. - Cardiology consult   History of PVCs /Palpitations - Continuous cardiac monitoring - Keep magnesium over 2 and potassium over 4   Possible acute bronchitis, viral - Patient has occasional wheezing and a mild cough.  Procalcitonin less than 0.1 - COVID and flu negative - Respiratory viral panel - DuoNebs as needed, antitussives - Supplemental oxygen    Transaminitis - Mild elevation.  Likely secondary to hepatic congestion from suspected CHF - Continue to monitor   Essential hypertension - Hold home amlodipine - Lisinopril started.  Hydralazine as needed   OSA (obstructive sleep apnea) - CPAP nightly    Prediabetes - We will get A1c    DVT prophylaxis: Lovenox  Code Status: full code  Family Communication:  son at bedside  Disposition Plan: Back to previous home environment Consults called: Cardiology Status:At the time of admission, it appears that the appropriate admission status for this patient is INPATIENT. This is judged to be reasonable and necessary in order to provide the required intensity of service to ensure the patient's safety given the presenting symptoms, physical exam findings, and initial radiographic and laboratory data in the context of their  Comorbid conditions.   Patient requires inpatient status due to high intensity of service, high risk for further deterioration and high frequency of surveillance required.   I certify that at the point  of admission it is my clinical judgment that the patient will require inpatient hospital care spanning beyond 2 midnights     Physical Exam: Vitals:   06/12/21 0044 06/12/21 0100 06/12/21 0200 06/12/21 0230  BP:  (!) 162/75 (!) 156/68 (!) 160/80  Pulse:  (!) 26 89 98  Resp:  (!) 21 19 (!) 22  Temp:      TempSrc:      SpO2:  95% 94% 91%  Weight: 102.1 kg     Height: 5\' 11"  (1.803 m)       Constitutional: Alert, oriented x 3 .  Conversational dyspnea.  Sitting upright on bed HEENT:      Head: Normocephalic and atraumatic.         Eyes: PERLA, EOMI, Conjunctivae are normal. Sclera is non-icteric.       Mouth/Throat: Mucous membranes are moist.       Neck: Supple with no signs of meningismus. Cardiovascular: Regular rate and rhythm. No murmurs, gallops, or rubs. 2+ symmetrical distal pulses are present . No JVD. No  LE edema Respiratory: Respiratory effort slightly increased with bibasilar rales gastrointestinal: Soft, non tender, non distended. Positive bowel sounds.  Genitourinary: No CVA tenderness. Musculoskeletal: Nontender with normal range of motion in all extremities. No cyanosis, or erythema of extremities. Neurologic:  Face is symmetric. Moving all extremities. No gross focal neurologic deficits . Skin: Skin is warm, dry.  No rash or ulcers Psychiatric: Mood and affect are appropriate     Past Medical History:  Diagnosis Date   BPH (benign prostatic hyperplasia)    Complication of anesthesia    Edema    MILD FEET/ANKLES OCCAS   History of kidney stones    Hyperlipidemia    Hypertension    Neuromuscular disorder (HCC)    TRIGEMINAL NEURALGIA   Palpitations    PONV (postoperative nausea and vomiting)    Sleep apnea    DOES NOT USE CPAP   Vertigo     Past Surgical History:  Procedure Laterality Date   CATARACT EXTRACTION W/PHACO Left 09/23/2017   Procedure: CATARACT EXTRACTION PHACO AND INTRAOCULAR LENS PLACEMENT ([place]);  Surgeon: Eulogio Bear, MD;  Location: [facility] ORS;  Service: Ophthalmology;  Laterality: Left;  [place] 00:19 AP% 8.5 CDE 1.64 Fluid pak lot # [id number]   COLONOSCOPY WITH PROPOFOL N/A 05/06/2016   Procedure: COLONOSCOPY WITH PROPOFOL;  Surgeon: Manya Silvas, MD;  Location: [facility] ENDOSCOPY;  Service: Endoscopy;  Laterality: N/A;   HERNIA REPAIR       reports that he has never smoked. He has never used smokeless tobacco. He reports  that he does not drink alcohol and does not use drugs.  Allergies  Allergen Reactions   Penicillins     History reviewed. No pertinent family history.    Prior to Admission medications   Medication Sig Start Date End Date Taking? Authorizing Provider  amLODipine (NORVASC) 5 MG tablet Take 5 mg by mouth daily.   Yes [provider]  aspirin EC 81 MG tablet Take 81 mg by mouth daily.   Yes [provider]  dutasteride (AVODART) 0.5 MG capsule Take 0.5 mg by mouth daily. 12/24/13  Yes [provider]  gabapentin (NEURONTIN) 300 MG capsule Take 300 mg by mouth 3 (three) times daily. 04/04/21  Yes [provider]  HYDROcodone-acetaminophen (NORCO/VICODIN) 5-325 MG tablet Take 1 tablet by mouth every 6 (six) hours as needed. 06/09/21  Yes [provider]  metaxalone (SKELAXIN) 800 MG tablet Take 800  mg by mouth 3 (three) times daily. 06/09/21  Yes [provider]  predniSONE (DELTASONE) 20 MG tablet Take 20 mg by mouth 2 (two) times daily. 06/09/21  Yes [provider]  rosuvastatin (CRESTOR) 10 MG tablet Take 10 mg by mouth daily.   Yes [provider]      Labs on Admission: I have personally reviewed following labs and imaging studies  CBC: Recent Labs  Lab 06/12/21 0034  WBC 7.7  HGB 12.8*  HCT 39.4  MCV 90.2  PLT 270   Basic Metabolic Panel: Recent Labs  Lab 06/12/21 0039  NA 136  K 4.1  CL 107  CO2 22  GLUCOSE 101*  BUN 35*  CREATININE 0.88  CALCIUM 8.7*   GFR: Estimated Creatinine Clearance: 81.4 mL/min (by C-G formula based on SCr of 0.88 mg/dL). Liver Function Tests: Recent Labs  Lab 06/12/21 0039  AST 53*  ALT 53*  ALKPHOS 46  BILITOT 1.6*  PROT 7.0  ALBUMIN 3.9   No results for input(s): LIPASE, AMYLASE in the last 168 hours. No results for input(s): AMMONIA in the last 168 hours. Coagulation Profile: Recent Labs  Lab 06/12/21 0039  INR 1.0   Cardiac Enzymes: No results  for input(s): CKTOTAL, CKMB, CKMBINDEX, TROPONINI in the last 168 hours. BNP (last 3 results) No results for input(s): PROBNP in the last 8760 hours. HbA1C: No results for input(s): HGBA1C in the last 72 hours. CBG: No results for input(s): GLUCAP in the last 168 hours. Lipid Profile: No results for input(s): CHOL, HDL, LDLCALC, TRIG, CHOLHDL, LDLDIRECT in the last 72 hours. Thyroid Function Tests: No results for input(s): TSH, T4TOTAL, FREET4, T3FREE, THYROIDAB in the last 72 hours. Anemia Panel: No results for input(s): VITAMINB12, FOLATE, FERRITIN, TIBC, IRON, RETICCTPCT in the last 72 hours. Urine analysis:    Component Value Date/Time   COLORURINE Straw 02/07/2013 1552   APPEARANCEUR Clear 02/07/2013 1552   LABSPEC 1.030 02/07/2013 1552   PHURINE 7.0 02/07/2013 1552   GLUCOSEU Negative 02/07/2013 1552   HGBUR 2+ 02/07/2013 1552   BILIRUBINUR Negative 02/07/2013 1552   KETONESUR Negative 02/07/2013 1552   PROTEINUR Negative 02/07/2013 1552   NITRITE Negative 02/07/2013 1552   LEUKOCYTESUR Negative 02/07/2013 1552    Radiological Exams on Admission: DG Chest Portable 1 View  Result Date: 06/12/2021 CLINICAL DATA:  Shortness of breath. EXAM: PORTABLE CHEST 1 VIEW COMPARISON:  None. FINDINGS: Moderate to marked severity diffusely increased interstitial lung markings are seen bilaterally. Mild areas of atelectasis and/or infiltrate are seen along the bilateral infrahilar regions. There is no evidence of a pleural effusion or pneumothorax. The cardiac silhouette is mildly enlarged. The visualized skeletal structures are unremarkable. IMPRESSION: 1. Moderate to marked severity interstitial edema. 2. Mild bilateral infrahilar atelectasis and/or infiltrate. Electronically Signed   By: Virgina Norfolk M.D.   On: 06/12/2021 00:55       Athena Masse MD [facility]   06/12/2021, 3:05 AM

## 2021-06-12 NOTE — Progress Notes (Signed)
Anticoagulation monitoring(Lovenox):  80 yo male ordered Lovenox 40 mg Q24h    Filed Weights   06/12/21 0044  Weight: 102.1 kg (225 lb 1.4 oz)   BMI 31.4    Lab Results  Component Value Date   CREATININE 0.88 06/12/2021   CREATININE 1.10 03/22/2017   CREATININE 1.24 02/07/2013   Estimated Creatinine Clearance: 81.4 mL/min (by C-G formula based on SCr of 0.88 mg/dL). Hemoglobin & Hematocrit     Component Value Date/Time   HGB 12.8 (L) 06/12/2021 0034   HGB 13.8 02/07/2013 1446   HCT 39.4 06/12/2021 0034   HCT 40.3 02/07/2013 1446     Per Protocol for Patient with estCrcl > 30 ml/min and BMI > 30, will transition to Lovenox 50 mg Q24h.

## 2021-06-12 NOTE — Progress Notes (Signed)
Patient seen and examined in the emergency room.  Son was at the bedside.  He stayed in the ER all night.  In brief, 80 year old gentleman with history of hypertension who was feeling some shortness of breath on exertion for last many months had acute onset of dyspnea after working on [employer], [employer]/product for Christmas at home.  Retrospectively complained of orthopnea and PND.  In the emergency room, hemodynamically stable.  BNP more than 900.  Started on IV Lasix, he had 3.4 L of urine output and already feels better.  Oxygen was running in his cheek and was comfortable.  Not mobilized yet.  Suspected congestive heart failure: Medically stabilizing.  Continue IV Lasix today.  Intake output monitoring.  Recheck electrolytes tomorrow morning.  Started on low-dose lisinopril.  Continue.  Echocardiogram pending.  Mobilize.  Further management will depend upon echocardiogram findings.  Anticipate discharge home tomorrow.  Will need cardiology follow-up.  Total time spent: 20 minutes.  No charge visit.  Same-day admission.

## 2021-06-12 NOTE — Consult Note (Signed)
°  Heart Failure Nurse Navigator Note  Echocardiogram results are pending at this time  Presented to the emergency room with complaints of shortness of breath on exertion, orthopnea and PND.  He also states last week between 2 different doctors appointments at 1 he had weighed 214 pounds and at the second on he weighed 219 pounds.  Chest x-ray revealed interstitial edema.  He states he is established with Dr. Ubaldo Glassing and follows with him for hypertension.  Comorbidities:  Hypertension Obstructive sleep apnea intolerant of CPAP Prediabetes Palpitations/PVCs   Medications:  Furosemide 40 mg IV twice a day Lisinopril 10 mg daily Aspirin 81 mg daily Crestor 10 mg daily Prednisone 20 mg twice a day  He states at home he is also on Skelaxin, Avodart, Norvasc and gabapentin.  Labs:  Sodium 136, potassium 4.1, chloride 107, CO2 22, BUN 35, creatinine 0.88, BNP on admission 904.  Ultrasound of the right upper quadrant revealed hepatic steatosis, small right pleural effusion. Weight is 102.1 kg Blood pressure 143/81  Initial meeting with patient today, lying in the emergency room on a gurney getting his echocardiogram completed.  He states that he is breathing is much better.  Asked if he ever been told that he has heart failure in the past he said no.  When I asked him to explain what heart failure is he asked if it was a heart attack.  Explained that heart failure is the heart's inability to meet the demands of the body.  He states that he has a scale but does not weigh himself on a daily basis.  Splane the reasoning behind weighing daily, and recording.  Made aware to report 2 to 3 pound weight gain overnight or 5 pounds within the week.  He states that his wife is a very good cook but he eats daily fast food.  Explained the reasoning behind making smarter choices when eating at fast food/restaurants.  He does use salt at the table.  Splane the reasoning for removing the saltshaker from  the table.  Also discussed fluid restriction.  Made aware that he has an appointment in the outpatient heart failure clinic on January 11 at 1230.  He has a 0% of no-show.  Pricilla Riffle RN [facility]

## 2021-06-12 NOTE — ED Notes (Signed)
Pt had multiple 4 beat runs of Vtach. EKG obtained and admitting MD made aware.

## 2021-06-12 NOTE — ED Notes (Signed)
Spoke with Dr. Tamala Julian re: starting antibiotics and fluids, per Dr. Tamala Julian he is waiting to see the results of the chest xray

## 2021-06-12 NOTE — ED Provider Notes (Signed)
[facility] Emergency Department Provider Note  ____________________________________________   Event Date/Time   First MD Initiated Contact with Patient 06/12/21 [phone]     (approximate)  I have reviewed the triage vital signs and the nursing notes.   HISTORY  Chief Complaint Shortness of Breath   HPI Cody BERA Sr. is a 80 y.o. male with below noted past medical history who presents for assessment of shortness of breath with slight cough.  Patient states it started little bit yesterday but not much worse today.  He was wheezing significantly at home with EMS and received duo nebs with EMS.  He has never had history of COPD or asthma and has never smoked.  He denies any associated chest pain, abdominal pain, nausea, vomiting, urinary symptoms, diarrhea, rash or extremity pain.  No headache, earache, sore throat, congestion or any other clear associated sick symptoms.  Patient states he has a little bit of soreness in his low back and was given a steroid shot at [facility] about a week ago but otherwise has no new back pain and this is not any different today.  In his lower back.  No extremity weakness numbness or tingling or urinary incontinence.         Past Medical History:  Diagnosis Date   BPH (benign prostatic hyperplasia)    Complication of anesthesia    Edema    MILD FEET/ANKLES OCCAS   History of kidney stones    Hyperlipidemia    Hypertension    Neuromuscular disorder (HCC)    TRIGEMINAL NEURALGIA   Palpitations    PONV (postoperative nausea and vomiting)    Sleep apnea    DOES NOT USE CPAP   Vertigo     Patient Active Problem List   Diagnosis Date Noted   Palpitations    Acute CHF (congestive heart failure) ([place])    Acute respiratory failure with hypoxia (HCC)    Transaminitis    Acute bronchitis    PVC's (premature ventricular contractions) 10/11/2017   Trigeminal neuralgia 04/05/2017   Prediabetes 10/26/2016   Healthcare  maintenance 10/28/2015   Benign non-nodular prostatic hyperplasia with lower urinary tract symptoms 04/14/2014   ED (erectile dysfunction) of organic origin 04/14/2014   Essential hypertension 04/14/2014   Nephrolithiasis 04/14/2014   OSA (obstructive sleep apnea) 04/14/2014   Pure hypercholesterolemia 04/14/2014    Past Surgical History:  Procedure Laterality Date   CATARACT EXTRACTION W/PHACO Left 09/23/2017   Procedure: CATARACT EXTRACTION PHACO AND INTRAOCULAR LENS PLACEMENT ([place]);  Surgeon: Eulogio Bear, MD;  Location: [facility] ORS;  Service: Ophthalmology;  Laterality: Left;  [place] 00:19 AP% 8.5 CDE 1.64 Fluid pak lot # [id number]   COLONOSCOPY WITH PROPOFOL N/A 05/06/2016   Procedure: COLONOSCOPY WITH PROPOFOL;  Surgeon: Manya Silvas, MD;  Location: [facility] ENDOSCOPY;  Service: Endoscopy;  Laterality: N/A;   HERNIA REPAIR      Prior to Admission medications   Medication Sig Start Date End Date Taking? Authorizing Provider  amLODipine (NORVASC) 5 MG tablet Take 5 mg by mouth daily.   Yes [provider]  aspirin EC 81 MG tablet Take 81 mg by mouth daily.   Yes [provider]  dutasteride (AVODART) 0.5 MG capsule Take 0.5 mg by mouth daily. 12/24/13  Yes [provider]  gabapentin (NEURONTIN) 300 MG capsule Take 300 mg by mouth 3 (three) times daily. 04/04/21  Yes [provider]  HYDROcodone-acetaminophen (NORCO/VICODIN) 5-325 MG tablet Take 1 tablet by mouth every  6 (six) hours as needed. 06/09/21  Yes [provider]  metaxalone (SKELAXIN) 800 MG tablet Take 800 mg by mouth 3 (three) times daily. 06/09/21  Yes [provider]  predniSONE (DELTASONE) 20 MG tablet Take 20 mg by mouth 2 (two) times daily. 06/09/21  Yes [provider]  rosuvastatin (CRESTOR) 10 MG tablet Take 10 mg by mouth daily.   Yes [provider]    Allergies Penicillins  History reviewed. No pertinent family history.  Social  History Social History   Tobacco Use   Smoking status: Never   Smokeless tobacco: Never  Vaping Use   Vaping Use: Never used  Substance Use Topics   Alcohol use: No   Drug use: No    Review of Systems  Review of Systems  Constitutional:  Negative for chills and fever.  HENT:  Negative for sore throat.   Eyes:  Negative for pain.  Respiratory:  Positive for cough, shortness of breath and wheezing. Negative for stridor.   Cardiovascular:  Negative for chest pain.  Gastrointestinal:  Negative for vomiting.  Musculoskeletal:  Positive for back pain.  Skin:  Negative for rash.  Neurological:  Negative for seizures, loss of consciousness and headaches.  Psychiatric/Behavioral:  Negative for suicidal ideas.   All other systems reviewed and are negative.    ____________________________________________   PHYSICAL EXAM:  VITAL SIGNS: ED Triage Vitals  Enc Vitals Group     BP      Pulse      Resp      Temp      Temp src      SpO2      Weight      Height      Head Circumference      Peak Flow      Pain Score      Pain Loc      Pain Edu?      Excl. in [place]?    Vitals:   06/12/21 0200 06/12/21 0230  BP: (!) 156/68 (!) 160/80  Pulse: 89 98  Resp: 19 (!) 22  Temp:    SpO2: 94% 91%   Physical Exam Vitals and nursing note reviewed.  Constitutional:      General: He is in acute distress.     Appearance: He is well-developed. He is ill-appearing.  HENT:     Head: Normocephalic and atraumatic.     Right Ear: External ear normal.     Left Ear: External ear normal.     Nose: Nose normal.     Mouth/Throat:     Mouth: Mucous membranes are dry.  Eyes:     Conjunctiva/sclera: Conjunctivae normal.  Cardiovascular:     Rate and Rhythm: Normal rate and regular rhythm.     Heart sounds: No murmur heard. Pulmonary:     Effort: Tachypnea, accessory muscle usage and respiratory distress present.     Breath sounds: Decreased breath sounds, wheezing and rhonchi present.   Abdominal:     Palpations: Abdomen is soft.     Tenderness: There is no abdominal tenderness. There is no right CVA tenderness or left CVA tenderness.  Musculoskeletal:        General: No swelling.     Cervical back: Neck supple.     Right lower leg: No edema.     Left lower leg: No edema.  Skin:    General: Skin is warm and dry.     Capillary Refill: Capillary refill takes 2 to  3 seconds.  Neurological:     General: No focal deficit present.     Mental Status: He is alert and oriented to person, place, and time.  Psychiatric:        Mood and Affect: Mood normal.    Patient has full strength of the bilateral lower extremities.  Sensation is intact light touch bilateral lower extremities.  No tenderness over the midline back or overlying skin changes over the back.  No CVA tenderness ____________________________________________   LABS (all labs ordered are listed, but only abnormal results are displayed)  Labs Reviewed  CBC - Abnormal; Notable for the following components:      Result Value   Hemoglobin 12.8 (*)    All other components within normal limits  COMPREHENSIVE METABOLIC PANEL - Abnormal; Notable for the following components:   Glucose, Bld 101 (*)    BUN 35 (*)    Calcium 8.7 (*)    AST 53 (*)    ALT 53 (*)    Total Bilirubin 1.6 (*)    All other components within normal limits  BRAIN NATRIURETIC PEPTIDE - Abnormal; Notable for the following components:   B Natriuretic Peptide 904.2 (*)    All other components within normal limits  D-DIMER, QUANTITATIVE - Abnormal; Notable for the following components:   D-Dimer, Quant 0.64 (*)    All other components within normal limits  RESP PANEL BY RT-PCR (FLU A&B, COVID) ARPGX2  CULTURE, BLOOD (ROUTINE X 2)  CULTURE, BLOOD (ROUTINE X 2)  LACTIC ACID, PLASMA  LACTIC ACID, PLASMA  PROTIME-INR  APTT  PROCALCITONIN  URINALYSIS, COMPLETE (UACMP) WITH MICROSCOPIC  MAGNESIUM  TROPONIN I (HIGH SENSITIVITY)  TROPONIN I  (HIGH SENSITIVITY)   ____________________________________________  EKG  ECG remarkable for sinus rhythm with a ventricular rate of 97, PVCs, nonspecific intraventricular conduction delay without evidence of other clear acute ischemia or significant arrhythmia or bundle-branch block at this time. ____________________________________________  RADIOLOGY  ED MD interpretation: Chest x-ray shows bilateral edema and somewhat difficult to exclude atelectasis or infiltrate.  There is no large effusion, pneumothorax or other clear acute thoracic process.  Official radiology report(s): DG Chest Portable 1 View  Result Date: 06/12/2021 CLINICAL DATA:  Shortness of breath. EXAM: PORTABLE CHEST 1 VIEW COMPARISON:  None. FINDINGS: Moderate to marked severity diffusely increased interstitial lung markings are seen bilaterally. Mild areas of atelectasis and/or infiltrate are seen along the bilateral infrahilar regions. There is no evidence of a pleural effusion or pneumothorax. The cardiac silhouette is mildly enlarged. The visualized skeletal structures are unremarkable. IMPRESSION: 1. Moderate to marked severity interstitial edema. 2. Mild bilateral infrahilar atelectasis and/or infiltrate. Electronically Signed   By: Virgina Norfolk M.D.   On: 06/12/2021 00:55    ____________________________________________   PROCEDURES  Procedure(s) performed (including Critical Care):  .Critical Care Performed by: Lucrezia Starch, MD Authorized by: Lucrezia Starch, MD   Critical care provider statement:    Critical care time (minutes):  30   Critical care was necessary to treat or prevent imminent or life-threatening deterioration of the following conditions:  Respiratory failure   Critical care was time spent personally by me on the following activities:  Development of treatment plan with patient or surrogate, discussions with consultants, evaluation of patient's response to treatment, examination of  patient, ordering and review of laboratory studies, ordering and review of radiographic studies, ordering and performing treatments and interventions, pulse oximetry, re-evaluation of patient's condition and review of old charts   ____________________________________________  INITIAL IMPRESSION / ASSESSMENT AND PLAN / ED COURSE        Patient presents with above-stated history exam for assessment of worsening shortness of breath does have some wheezing and cough started yesterday become much worse today.  He has no history of this including a tobacco abuse history or COPD or asthma.  He did receive some duo nebs with EMS and apparently had a room air SPO2 of 84% on arrival.  He was transported to the ED on 6 L.  On arrival to emergency room he is satting 89 to 91% on 6 L.  He has diffuse wheezing and rhonchi.  Differential considerations include pneumonia with component of reactive airway disease, PE, ACS, arrhythmia, CHF, metabolic derangements, anemia with a lower suspicion for anaphylaxis or allergic reaction given no preceding exposure to new medications as it seems a steroid shot was several days before onset of symptoms.  D-dimer is within age-adjusted limits at 0.64.  Low suspicion for PE at this time.  CMP remarkable for AST of 53, ALT of 53 and T bili of 1.6.  No other significant electrolyte or metabolic derangements.  Coags are unremarkable.   ECG remarkable for sinus rhythm with a ventricular rate of 97, PVCs, nonspecific intraventricular conduction delay without evidence of other clear acute ischemia or significant arrhythmia or bundle-branch block at this time.  Initial troponin is nonelevated and for low suspicion for ACS or myocarditis at this time.  COVID and influenza PCR is negative.  CBC shows no leukocytosis and hemoglobin of 12.8.  BNP is elevated at 904.  Chest x-ray shows bilateral edema and somewhat difficult to exclude atelectasis or infiltrate.  There is no large  effusion, pneumothorax or other clear acute thoracic process.  Patient's presentation and work-up is most consistent with likely acute heart failure of unclear etiology.  He was initially given 40 mg of Lasix and states he is feeling much better on my reassessment about 2 hours after arrival.  We will give an additional 40 mg and admit to hospital service for further evaluation and management.  I suspect patient's mild elevated T bili and LFTs just above upper limit normal secondary to hepatic congestion follow-up we will add a" ultrasound as well follow this up.  On several reassessments patient resting comfortably and breathing comfortably on 10 L high flow nasal cannula.     ____________________________________________   FINAL CLINICAL IMPRESSION(S) / ED DIAGNOSES  Final diagnoses:  Bilirubinemia  Acute respiratory failure with hypoxia (HCC)  Acute congestive heart failure, unspecified heart failure type (HCC)    Medications  aspirin EC tablet 81 mg (has no administration in time range)  HYDROcodone-acetaminophen (NORCO/VICODIN) 5-325 MG per tablet 1 tablet (has no administration in time range)  rosuvastatin (CRESTOR) tablet 10 mg (has no administration in time range)  predniSONE (DELTASONE) tablet 20 mg (has no administration in time range)  sodium chloride flush (NS) 0.9 % injection 3 mL (3 mLs Intravenous Not Given 06/12/21 0253)  sodium chloride flush (NS) 0.9 % injection 3 mL (has no administration in time range)  0.9 %  sodium chloride infusion (has no administration in time range)  acetaminophen (TYLENOL) tablet 650 mg (has no administration in time range)  ondansetron (ZOFRAN) injection 4 mg (has no administration in time range)  enoxaparin (LOVENOX) injection 50 mg (has no administration in time range)  furosemide (LASIX) injection 40 mg (has no administration in time range)  lisinopril (ZESTRIL) tablet 10 mg (has no administration in time range)  ipratropium-albuterol  (DUONEB) 0.5-2.5 (3) MG/3ML nebulizer solution 9 mL (9 mLs Nebulization Given 06/12/21 0052)  furosemide (LASIX) injection 40 mg (40 mg Intravenous Given 06/12/21 0204)  furosemide (LASIX) injection 40 mg (40 mg Intravenous Given 06/12/21 0247)     ED Discharge Orders     None        Note:  This document was prepared using Dragon voice recognition software and may include unintentional dictation errors.    Lucrezia Starch, MD 06/12/21 [phone]

## 2021-06-13 ENCOUNTER — Encounter: Payer: Self-pay | Admitting: Internal Medicine

## 2021-06-13 ENCOUNTER — Encounter
Admission: EM | Disposition: A | Discharge status: Home / Self Care | Payer: Self-pay | Source: Home / Self Care | Attending: Internal Medicine

## 2021-06-13 HISTORY — PX: RIGHT/LEFT HEART CATH AND CORONARY ANGIOGRAPHY: CATH118266

## 2021-06-13 LAB — BASIC METABOLIC PANEL
Anion gap: 8 (ref 5–15)
BUN: 32 mg/dL — ABNORMAL HIGH (ref 8–23)
CO2: 27 mmol/L (ref 22–32)
Calcium: 9.3 mg/dL (ref 8.9–10.3)
Chloride: 104 mmol/L (ref 98–111)
Creatinine, Ser: 0.91 mg/dL (ref 0.61–1.24)
GFR, Estimated: >60 mL/min (ref >60)
Glucose, Bld: 120 mg/dL — ABNORMAL HIGH (ref 70–99)
Potassium: 3.8 mmol/L (ref 3.5–5.1)
Sodium: 139 mmol/L (ref 135–145)

## 2021-06-13 LAB — CARDIAC CATHETERIZATION: Cath EF Quantitative: 25 %

## 2021-06-13 LAB — MRSA NEXT GEN BY PCR, NASAL: MRSA by PCR Next Gen: NOT DETECTED

## 2021-06-13 SURGERY — RIGHT/LEFT HEART CATH AND CORONARY ANGIOGRAPHY
Anesthesia: Moderate Sedation

## 2021-06-13 MED ORDER — SODIUM CHLORIDE 0.9% FLUSH: 3.0000 mL | INTRAVENOUS | Status: DC | PRN

## 2021-06-13 MED ORDER — LIDOCAINE HCL (PF) 1 % IJ SOLN
INTRAMUSCULAR | Status: DC | PRN
  Administered 2021-06-13: 5 mL

## 2021-06-13 MED ORDER — VERAPAMIL HCL 2.5 MG/ML IV SOLN
INTRAVENOUS | Status: AC
  Filled 2021-06-13: qty 2

## 2021-06-13 MED ORDER — MIDAZOLAM HCL 2 MG/2ML IJ SOLN
INTRAMUSCULAR | Status: AC
  Filled 2021-06-13: qty 2

## 2021-06-13 MED ORDER — IOHEXOL 300 MG/ML  SOLN
INTRAMUSCULAR | Status: DC | PRN
  Administered 2021-06-13: 11:00:00 88 mL

## 2021-06-13 MED ORDER — CHLORHEXIDINE GLUCONATE CLOTH 2 % EX PADS
6.0000 | MEDICATED_PAD | Freq: Every day | CUTANEOUS | Status: DC
  Administered 2021-06-13: 17:00:00 6 via TOPICAL

## 2021-06-13 MED ORDER — HEPARIN SODIUM (PORCINE) 1000 UNIT/ML IJ SOLN
INTRAMUSCULAR | Status: DC | PRN
  Administered 2021-06-13: 5000 [IU] via INTRAVENOUS

## 2021-06-13 MED ORDER — FUROSEMIDE 20 MG PO TABS
40.0000 mg | ORAL_TABLET | Freq: Two times a day (BID) | ORAL | Status: DC
  Administered 2021-06-13 – 2021-06-14 (×3): 40 mg via ORAL
  Filled 2021-06-13 (×3): qty 2

## 2021-06-13 MED ORDER — ONDANSETRON HCL 4 MG/2ML IJ SOLN: 4.0000 mg | Freq: Four times a day (QID) | INTRAMUSCULAR | Status: DC | PRN

## 2021-06-13 MED ORDER — SODIUM CHLORIDE 0.9 % IV SOLN: 250.0000 mL | INTRAVENOUS | Status: DC | PRN

## 2021-06-13 MED ORDER — FENTANYL CITRATE (PF) 100 MCG/2ML IJ SOLN
INTRAMUSCULAR | Status: AC
  Filled 2021-06-13: qty 2

## 2021-06-13 MED ORDER — HYDRALAZINE HCL 20 MG/ML IJ SOLN: 10.0000 mg | INTRAMUSCULAR | Status: AC | PRN

## 2021-06-13 MED ORDER — CARVEDILOL 6.25 MG PO TABS: 6.2500 mg | ORAL_TABLET | Freq: Two times a day (BID) | ORAL | Status: DC

## 2021-06-13 MED ORDER — ASPIRIN EC 81 MG PO TBEC
81.0000 mg | DELAYED_RELEASE_TABLET | Freq: Every day | ORAL | Status: DC
  Administered 2021-06-14: 09:00:00 81 mg via ORAL
  Filled 2021-06-13: qty 1

## 2021-06-13 MED ORDER — LOSARTAN POTASSIUM 50 MG PO TABS: 50.0000 mg | ORAL_TABLET | Freq: Two times a day (BID) | ORAL | Status: DC

## 2021-06-13 MED ORDER — SODIUM CHLORIDE 0.9% FLUSH
3.0000 mL | Freq: Two times a day (BID) | INTRAVENOUS | Status: DC
  Administered 2021-06-14: 13:00:00 3 mL via INTRAVENOUS

## 2021-06-13 MED ORDER — VERAPAMIL HCL 2.5 MG/ML IV SOLN
INTRAVENOUS | Status: DC | PRN
  Administered 2021-06-13: 2.5 mg via INTRA_ARTERIAL

## 2021-06-13 MED ORDER — LIDOCAINE HCL 1 % IJ SOLN
INTRAMUSCULAR | Status: AC
  Filled 2021-06-13: qty 20

## 2021-06-13 MED ORDER — MIDAZOLAM HCL 2 MG/2ML IJ SOLN
INTRAMUSCULAR | Status: DC | PRN
  Administered 2021-06-13: 1 mg via INTRAVENOUS

## 2021-06-13 MED ORDER — HEPARIN (PORCINE) IN NACL 1000-0.9 UT/500ML-% IV SOLN
INTRAVENOUS | Status: AC
  Filled 2021-06-13: qty 1000

## 2021-06-13 MED ORDER — ASPIRIN 81 MG PO CHEW
CHEWABLE_TABLET | ORAL | Status: AC
  Filled 2021-06-13: qty 1

## 2021-06-13 MED ORDER — ACETAMINOPHEN 325 MG PO TABS
650.0000 mg | ORAL_TABLET | ORAL | Status: DC | PRN
  Administered 2021-06-13 – 2021-06-14 (×3): 650 mg via ORAL
  Filled 2021-06-13 (×3): qty 2

## 2021-06-13 MED ORDER — HEPARIN (PORCINE) IN NACL 2000-0.9 UNIT/L-% IV SOLN
INTRAVENOUS | Status: DC | PRN
  Administered 2021-06-13: 1000 mL

## 2021-06-13 MED ORDER — FENTANYL CITRATE (PF) 100 MCG/2ML IJ SOLN
INTRAMUSCULAR | Status: DC | PRN
  Administered 2021-06-13: 25 ug via INTRAVENOUS

## 2021-06-13 MED ORDER — SACUBITRIL-VALSARTAN 24-26 MG PO TABS
1.0000 | ORAL_TABLET | Freq: Two times a day (BID) | ORAL | Status: DC
  Filled 2021-06-13 (×3): qty 1

## 2021-06-13 MED ORDER — LABETALOL HCL 5 MG/ML IV SOLN: 10.0000 mg | INTRAVENOUS | Status: AC | PRN

## 2021-06-13 MED ORDER — SACUBITRIL-VALSARTAN 24-26 MG PO TABS
1.0000 | ORAL_TABLET | Freq: Two times a day (BID) | ORAL | Status: DC
  Administered 2021-06-13 – 2021-06-14 (×2): 1 via ORAL
  Filled 2021-06-13 (×3): qty 1

## 2021-06-13 MED ORDER — ASPIRIN 81 MG PO CHEW
81.0000 mg | CHEWABLE_TABLET | ORAL | Status: AC
  Administered 2021-06-13: 09:00:00 81 mg via ORAL

## 2021-06-13 MED ORDER — ASPIRIN 81 MG PO CHEW: 81.0000 mg | CHEWABLE_TABLET | Freq: Every day | ORAL | Status: DC

## 2021-06-13 MED ORDER — HEPARIN SODIUM (PORCINE) 1000 UNIT/ML IJ SOLN
INTRAMUSCULAR | Status: AC
  Filled 2021-06-13: qty 10

## 2021-06-13 MED ORDER — SODIUM CHLORIDE 0.9 % IV SOLN: INTRAVENOUS | Status: DC

## 2021-06-13 MED ORDER — SODIUM CHLORIDE 0.9 % IV SOLN
250.0000 mL | INTRAVENOUS | Status: DC | PRN
  Administered 2021-06-13: 09:00:00 1000 mL via INTRAVENOUS

## 2021-06-13 SURGICAL SUPPLY — 12 items
CATH 5FR JL3.5 JR4 ANG PIG MP (CATHETERS) ×2 IMPLANT
CATH BALLN WEDGE 5F 110CM (CATHETERS) ×2 IMPLANT
DEVICE RAD TR BAND REGULAR (VASCULAR PRODUCTS) ×2 IMPLANT
DRAPE BRACHIAL (DRAPES) ×4 IMPLANT
GLIDESHEATH SLEND SS 6F .021 (SHEATH) ×2 IMPLANT
GUIDEWIRE INQWIRE 1.5J.035X260 (WIRE) IMPLANT
INQWIRE 1.5J .035X260CM (WIRE) ×3
PACK CARDIAC CATH (CUSTOM PROCEDURE TRAY) ×3 IMPLANT
PROTECTION STATION PRESSURIZED (MISCELLANEOUS) ×3
SET ATX SIMPLICITY (MISCELLANEOUS) ×2 IMPLANT
SHEATH GLIDE SLENDER 4/5FR (SHEATH) ×2 IMPLANT
STATION PROTECTION PRESSURIZED (MISCELLANEOUS) IMPLANT

## 2021-06-13 NOTE — Progress Notes (Signed)
[facility] Cardiology    SUBJECTIVE: Patient doing much better status post cardiac cath right radial approach denies any pain no worsening shortness of breath no leg edema feels eager to go home   Vitals:   06/13/21 1230 06/13/21 1300 06/13/21 1330 06/13/21 1400  BP: 120/67 123/71 120/67 124/76  Pulse: 66 65 66 67  Resp: 16 13 11 13   Temp:      TempSrc:      SpO2: 96% 95% 95% 96%  Weight:      Height:         Intake/Output Summary (Last 24 hours) at 06/13/2021 1409 Last data filed at 06/13/2021 0845 Gross per 24 hour  Intake --  Output 2050 ml  Net -2050 ml      PHYSICAL EXAM  General: Well developed, well nourished, in no acute distress HEENT:  Normocephalic and atramatic Neck:  No JVD.  Lungs: Clear bilaterally to auscultation and percussion. Heart: HRRR . Normal S1 and S2 without gallops or murmurs.  Abdomen: Bowel sounds are positive, abdomen soft and non-tender  Msk:  Back normal, normal gait. Normal strength and tone for age. Extremities: No clubbing, cyanosis or edema.   Neuro: Alert and oriented X 3. Psych:  Good affect, responds appropriately   LABS: Basic Metabolic Panel: Recent Labs    06/12/21 0036 06/12/21 0039 06/13/21 0642  NA  --  136 139  K  --  4.1 3.8  CL  --  107 104  CO2  --  22 27  GLUCOSE  --  101* 120*  BUN  --  35* 32*  CREATININE  --  0.88 0.91  CALCIUM  --  8.7* 9.3  MG 2.1  --   --    Liver Function Tests: Recent Labs    06/12/21 0039  AST 53*  ALT 53*  ALKPHOS 46  BILITOT 1.6*  PROT 7.0  ALBUMIN 3.9   No results for input(s): LIPASE, AMYLASE in the last 72 hours. CBC: Recent Labs    06/12/21 0034  WBC 7.7  HGB 12.8*  HCT 39.4  MCV 90.2  PLT 187   Cardiac Enzymes: No results for input(s): CKTOTAL, CKMB, CKMBINDEX, TROPONINI in the last 72 hours. BNP: Invalid input(s): POCBNP D-Dimer: Recent Labs    06/12/21 0039  DDIMER 0.64*   Hemoglobin A1C: No results for input(s): HGBA1C in the last 72  hours. Fasting Lipid Panel: No results for input(s): CHOL, HDL, LDLCALC, TRIG, CHOLHDL, LDLDIRECT in the last 72 hours. Thyroid Function Tests: No results for input(s): TSH, T4TOTAL, T3FREE, THYROIDAB in the last 72 hours.  Invalid input(s): FREET3 Anemia Panel: No results for input(s): VITAMINB12, FOLATE, FERRITIN, TIBC, IRON, RETICCTPCT in the last 72 hours.  CARDIAC CATHETERIZATION  Result Date: 06/13/2021   Prox LAD lesion is 50% stenosed.   There is severe left ventricular systolic dysfunction.   LV end diastolic pressure is mildly elevated.   The left ventricular ejection fraction is less than 25% by visual estimate.   No significant obstructive coronary disease   Normal right heart pressures   Dilated nonischemic cardiomyopathy Conclusion Normal right heart pressures Severely depressed left ventricular function consistent with a dilated nonischemic cardiomyopathy Ejection fraction less than 25% Coronaries No significant obstructive coronary artery disease left dominant system  DG Chest Portable 1 View  Result Date: 06/12/2021 CLINICAL DATA:  Shortness of breath. EXAM: PORTABLE CHEST 1 VIEW COMPARISON:  None. FINDINGS: Moderate to marked severity diffusely increased interstitial lung markings are seen bilaterally. Mild areas  of atelectasis and/or infiltrate are seen along the bilateral infrahilar regions. There is no evidence of a pleural effusion or pneumothorax. The cardiac silhouette is mildly enlarged. The visualized skeletal structures are unremarkable. IMPRESSION: 1. Moderate to marked severity interstitial edema. 2. Mild bilateral infrahilar atelectasis and/or infiltrate. Electronically Signed   By: Virgina Norfolk M.D.   On: 06/12/2021 00:55   ECHOCARDIOGRAM COMPLETE  Result Date: 06/12/2021    ECHOCARDIOGRAM REPORT   Patient Name:   Cody DELDUCA Sr. Date of Exam: 06/12/2021 Medical Rec #:  [id number]              Height:       71.0 in Accession #:    [id number]              Weight:       225.1 lb Date of Birth:  1941-06-10               BSA:          2.217 m Patient Age:    80 years               BP:           147/85 mmHg Patient Gender: M                      HR:           73 bpm. Exam Location:  [facility] Procedure: 2D Echo, Color Doppler and Cardiac Doppler Indications:     R06.03 Acute respiratory distress  History:         Patient has no prior history of Echocardiogram examinations.                  Risk Factors:Hypertension, Dyslipidemia and Sleep Apnea.  Sonographer:     Charmayne Sheer Referring Phys:  [id number] Ida Rogue SMITH Diagnosing Phys: Yolonda Kida MD  Sonographer Comments: Suboptimal subcostal window. IMPRESSIONS  1. Left ventricular ejection fraction, by estimation, is 25 to 30%. The left ventricle has severely decreased function. The left ventricle demonstrates global hypokinesis. The left ventricular internal cavity size was severely dilated. Left ventricular diastolic parameters are consistent with Grade I diastolic dysfunction (impaired relaxation).  2. Right ventricular systolic function is low normal. The right ventricular size is normal.  3. Left atrial size was mildly dilated.  4. Right atrial size was mildly dilated.  5. The mitral valve is normal in structure. Trivial mitral valve regurgitation.  6. The aortic valve is normal in structure. Aortic valve regurgitation is not visualized. FINDINGS  Left Ventricle: Left ventricular ejection fraction, by estimation, is 25 to 30%. The left ventricle has severely decreased function. The left ventricle demonstrates global hypokinesis. The left ventricular internal cavity size was severely dilated. There is no left ventricular hypertrophy. Left ventricular diastolic parameters are consistent with Grade I diastolic dysfunction (impaired relaxation). Right Ventricle: The right ventricular size is normal. No increase in right ventricular wall thickness. Right ventricular systolic function is low normal. Left Atrium: Left  atrial size was mildly dilated. Right Atrium: Right atrial size was mildly dilated. Pericardium: There is no evidence of pericardial effusion. Mitral Valve: The mitral valve is normal in structure. Trivial mitral valve regurgitation. MV peak gradient, 6.5 mmHg. The mean mitral valve gradient is 3.5 mmHg. Tricuspid Valve: The tricuspid valve is normal in structure. Tricuspid valve regurgitation is mild. Aortic Valve: The aortic valve is normal in structure. Aortic valve regurgitation is not visualized. Aortic  valve mean gradient measures 6.0 mmHg. Aortic valve peak gradient measures 9.9 mmHg. Aortic valve area, by VTI measures 1.84 cm. Pulmonic Valve: The pulmonic valve was grossly normal. Pulmonic valve regurgitation is not visualized. Aorta: The ascending aorta was not well visualized. IAS/Shunts: No atrial level shunt detected by color flow Doppler. Additional Comments: There is no pleural effusion.  LEFT VENTRICLE PLAX 2D LVIDd:         6.42 cm   Diastology LVIDs:         5.53 cm   LV e' medial:    4.46 cm/s LV PW:         1.15 cm   LV E/e' medial:  23.6 LV IVS:        0.94 cm   LV e' lateral:   11.60 cm/s LVOT diam:     2.00 cm   LV E/e' lateral: 9.1 LV SV:         55 LV SV Index:   25 LVOT Area:     3.14 cm  RIGHT VENTRICLE RV Basal diam:  2.82 cm RV S prime:     16.60 cm/s LEFT ATRIUM             Index        RIGHT ATRIUM           Index LA diam:        4.90 cm 2.21 cm/m   RA Area:     16.10 cm LA Vol (A2C):   73.0 ml 32.92 ml/m  RA Volume:   38.60 ml  17.41 ml/m LA Vol (A4C):   43.9 ml 19.80 ml/m LA Biplane Vol: 57.7 ml 26.02 ml/m  AORTIC VALVE                     PULMONIC VALVE AV Area (Vmax):    1.85 cm      PV Vmax:       1.25 m/s AV Area (Vmean):   1.77 cm      PV Vmean:      78.900 cm/s AV Area (VTI):     1.84 cm      PV VTI:        0.183 m AV Vmax:           157.00 cm/s   PV Peak grad:  6.2 mmHg AV Vmean:          114.000 cm/s  PV Mean grad:  3.0 mmHg AV VTI:            0.301 m AV Peak Grad:       9.9 mmHg AV Mean Grad:      6.0 mmHg LVOT Vmax:         92.40 cm/s LVOT Vmean:        64.100 cm/s LVOT VTI:          0.176 m LVOT/AV VTI ratio: 0.58  AORTA Ao Root diam: 3.10 cm MITRAL VALVE MV Area (PHT): 6.12 cm     SHUNTS MV Area VTI:   2.04 cm     Systemic VTI:  0.18 m MV Peak grad:  6.5 mmHg     Systemic Diam: 2.00 cm MV Mean grad:  3.5 mmHg MV Vmax:       1.27 m/s MV Vmean:      90.4 cm/s MV Decel Time: 124 msec MV E velocity: 105.20 cm/s MV A velocity: 119.50 cm/s MV E/A ratio:  0.88 Yolonda Kida MD Electronically  signed by Yolonda Kida MD Signature Date/Time: 06/12/2021/2:15:05 PM    Final    US ABDOMEN LIMITED RUQ (LIVER/GB)  Result Date: 06/12/2021 CLINICAL DATA:  Bilirubinemia. EXAM: ULTRASOUND ABDOMEN LIMITED RIGHT UPPER QUADRANT COMPARISON:  None. FINDINGS: Gallbladder: No gallstones or wall thickening visualized (2.6 mm). No sonographic Murphy sign noted by sonographer. Common bile duct: Diameter: 3.8 mm Liver: No focal lesion identified. Mildly increased echogenicity of the liver parenchyma is noted. Portal vein is patent on color Doppler imaging with normal direction of blood flow towards the liver. Other: There is a small right pleural effusion. IMPRESSION: 1. Hepatic steatosis without focal liver lesions. 2. Small right pleural effusion. Electronically Signed   By: Virgina Norfolk M.D.   On: 06/12/2021 03:33     Echo severely depressed left ventricular function dilated LV EF less than 25%  TELEMETRY: Normal sinus rhythm rate of about 75 nonspecific ST-T wave changes:  ASSESSMENT AND PLAN:  Principal Problem:   Acute respiratory failure with hypoxia (HCC) Active Problems:   Essential hypertension   OSA (obstructive sleep apnea)   Prediabetes   Palpitations   Acute CHF (congestive heart failure) (HCC)   Transaminitis   Acute bronchitis   CHF exacerbation (HCC)    Plan Status post cardiac cath for heart failure Normal right-sided pulmonary pressures No  significant coronary disease suggestive of a nonischemic cardiomyopathy Recommend advancing heart failure medications Recommend LifeVest for 90 days Reevaluate heart function with an echocardiogram and determine whether AICD is necessary Advance losartan to 50 twice a day in anticipation of switching to [employer] Lasix to p.o. and if patient is discharged Add spironolactone 12.5 daily Increase Coreg to 12.5 twice a day Aggressive hypertension management control Evaluate and treat obstructive sleep apnea Have the patient have early follow-up with cardiology within 1 to 2 weeks    Yolonda Kida, MD 06/13/2021 2:09 PM

## 2021-06-13 NOTE — ED Notes (Signed)
Pt awake, sitting on edge of the bed. Pt states that he would like to get up to the toilet. Pt has steady gait. Pt is in NAD.

## 2021-06-13 NOTE — Progress Notes (Signed)
PROGRESS NOTE    Cody REALI Sr.  NLG:[id number] DOB: [birth year] DOA: 06/12/2021 PCP: Kirk Ruths, MD    Brief Narrative:  80 year old gentleman with history of hypertension presented to the ER with 3 months of orthopnea and dyspnea on exertion, acute worsening symptoms for a day.  In the emergency room, BNP more than 900.  Started on IV Lasix with clinical improvement.  Echocardiogram with ejection fraction 20 to 25%.  Underwent cardiac cath and found to have no significant coronary obstruction.   Assessment & Plan:   Principal Problem:   Acute respiratory failure with hypoxia (HCC) Active Problems:   Essential hypertension   OSA (obstructive sleep apnea)   Prediabetes   Palpitations   Acute CHF (congestive heart failure) (HCC)   Transaminitis   Acute bronchitis   CHF exacerbation (HCC)  Acute respiratory failure with hypoxemia, present on admission.  Improved and currently on room air. Acute nonischemic cardiomyopathy with systolic congestive heart failure: New onset. Cardiac cath 12/23, no coronary disease.  EF 25%. On IV Lasix 40 mg twice daily, obtained good diuresis, changing to oral Lasix 40 mg twice daily tonight. started on Aldactone, Entresto.  On carvedilol on increasing dose. Nonischemic cardiomyopathy.  Outpatient sleep study recommended.  Cardiology will give him LifeVest.  He will schedule follow-up and repeat echocardiogram in the future. Mobilize in the hallway today, if remains well euvolumic, anticipate home tomorrow with heart failure regimen. Recheck electrolytes tomorrow morning.  Shortness of breath, likely related to 1.  No evidence of COVID or influenza.  Discontinue prednisone.  As needed bronchodilators.  Essential hypertension: As above.  Obstructive sleep apnea: Not using CPAP.  Will need repeated study.    DVT prophylaxis:   Lovenox subcu   Code Status: Full code Family Communication: Son at the bedside Disposition Plan:  Status is: Inpatient  Remains inpatient appropriate because: Inpatient cardiac cath planned, on IV diuresis.         Consultants:  Cardiology  Procedures:  Cardiac cath planned today  Antimicrobials:  None   Subjective: Patient seen and examined.  He was in the recovery from cardiac cath.  Shortness of breath has improved.  He has not mobilized.  Son at the bedside.  Denies any chest pain.  Objective: Vitals:   06/13/21 1230 06/13/21 1300 06/13/21 1330 06/13/21 1400  BP: 120/67 123/71 120/67 124/76  Pulse: 66 65 66 67  Resp: 16 13 11 13   Temp:      TempSrc:      SpO2: 96% 95% 95% 96%  Weight:      Height:        Intake/Output Summary (Last 24 hours) at 06/13/2021 1516 Last data filed at 06/13/2021 1415 Gross per 24 hour  Intake --  Output 2250 ml  Net -2250 ml   Filed Weights   06/12/21 0044 06/13/21 0833  Weight: 102.1 kg 97.5 kg    Examination:  General exam: Appears calm and comfortable , currently on room air. Respiratory system: Clear to auscultation. Respiratory effort normal. Cardiovascular system: S1 & S2 heard, RRR.  No edema. Gastrointestinal system: Soft and nontender.  Bowel sound present. Central nervous system: Alert and oriented. No focal neurological deficits. Extremities: Symmetric 5 x 5 power. Skin: No rashes, lesions or ulcers Psychiatry: Judgement and insight appear normal. Mood & affect appropriate.     Data Reviewed: I have personally reviewed following labs and imaging studies  CBC: Recent Labs  Lab 06/12/21 0034  WBC 7.7  HGB  12.8*  HCT 39.4  MCV 90.2  PLT 244   Basic Metabolic Panel: Recent Labs  Lab 06/12/21 0036 06/12/21 0039 06/13/21 0642  NA  --  136 139  K  --  4.1 3.8  CL  --  107 104  CO2  --  22 27  GLUCOSE  --  101* 120*  BUN  --  35* 32*  CREATININE  --  0.88 0.91  CALCIUM  --  8.7* 9.3  MG 2.1  --   --    GFR: Estimated Creatinine Clearance: 75.8 mL/min (by C-G formula based on SCr of 0.91  mg/dL). Liver Function Tests: Recent Labs  Lab 06/12/21 0039  AST 53*  ALT 53*  ALKPHOS 46  BILITOT 1.6*  PROT 7.0  ALBUMIN 3.9   No results for input(s): LIPASE, AMYLASE in the last 168 hours. No results for input(s): AMMONIA in the last 168 hours. Coagulation Profile: Recent Labs  Lab 06/12/21 0039  INR 1.0   Cardiac Enzymes: No results for input(s): CKTOTAL, CKMB, CKMBINDEX, TROPONINI in the last 168 hours. BNP (last 3 results) No results for input(s): PROBNP in the last 8760 hours. HbA1C: No results for input(s): HGBA1C in the last 72 hours. CBG: No results for input(s): GLUCAP in the last 168 hours. Lipid Profile: No results for input(s): CHOL, HDL, LDLCALC, TRIG, CHOLHDL, LDLDIRECT in the last 72 hours. Thyroid Function Tests: No results for input(s): TSH, T4TOTAL, FREET4, T3FREE, THYROIDAB in the last 72 hours. Anemia Panel: No results for input(s): VITAMINB12, FOLATE, FERRITIN, TIBC, IRON, RETICCTPCT in the last 72 hours. Sepsis Labs: Recent Labs  Lab 06/12/21 0039 06/12/21 0239  PROCALCITON <0.10  --   LATICACIDVEN 1.3 1.6    Recent Results (from the past 240 hour(s))  Resp Panel by RT-PCR (Flu A&B, Covid) Nasopharyngeal Swab     Status: None   Collection Time: 06/12/21 12:28 AM   Specimen: Nasopharyngeal Swab; Nasopharyngeal(NP) swabs in vial transport medium  Result Value Ref Range Status   SARS Coronavirus 2 by RT PCR NEGATIVE NEGATIVE Final    Comment: (NOTE) SARS-CoV-2 target nucleic acids are NOT DETECTED.  The SARS-CoV-2 RNA is generally detectable in upper respiratory specimens during the acute phase of infection. The lowest concentration of SARS-CoV-2 viral copies this assay can detect is 138 copies/mL. A negative result does not preclude SARS-Cov-2 infection and should not be used as the sole basis for treatment or other patient management decisions. A negative result may occur with  improper specimen collection/handling, submission of  specimen other than nasopharyngeal swab, presence of viral mutation(s) within the areas targeted by this assay, and inadequate number of viral copies(<138 copies/mL). A negative result must be combined with clinical observations, patient history, and epidemiological information. The expected result is Negative.  Fact Sheet for Patients:  EntrepreneurPulse.com.au  Fact Sheet for Healthcare Providers:  IncredibleEmployment.be  This test is no t yet approved or cleared by the [place] FDA and  has been authorized for detection and/or diagnosis of SARS-CoV-2 by FDA under an Emergency Use Authorization (EUA). This EUA will remain  in effect (meaning this test can be used) for the duration of the COVID-19 declaration under Section 564(b)(1) of the Act, 21 U.S.C.section 360bbb-3(b)(1), unless the authorization is terminated  or revoked sooner.       Influenza A by PCR NEGATIVE NEGATIVE Final   Influenza B by PCR NEGATIVE NEGATIVE Final    Comment: (NOTE) The Xpert Xpress SARS-CoV-2/FLU/RSV plus assay is intended as an  aid in the diagnosis of influenza from Nasopharyngeal swab specimens and should not be used as a sole basis for treatment. Nasal washings and aspirates are unacceptable for Xpert Xpress SARS-CoV-2/FLU/RSV testing.  Fact Sheet for Patients: EntrepreneurPulse.com.au  Fact Sheet for Healthcare Providers: IncredibleEmployment.be  This test is not yet approved or cleared by the [place] FDA and has been authorized for detection and/or diagnosis of SARS-CoV-2 by FDA under an Emergency Use Authorization (EUA). This EUA will remain in effect (meaning this test can be used) for the duration of the COVID-19 declaration under Section 564(b)(1) of the Act, 21 U.S.C. section 360bbb-3(b)(1), unless the authorization is terminated or revoked.  Performed at [facility], [place], [place] [address]   Respiratory (~20 pathogens) panel by PCR     Status: None   Collection Time: 06/12/21 12:36 AM   Specimen: Nasopharyngeal Swab; Respiratory  Result Value Ref Range Status   Adenovirus NOT DETECTED NOT DETECTED Final   Coronavirus 229E NOT DETECTED NOT DETECTED Final    Comment: (NOTE) The Coronavirus on the Respiratory Panel, DOES NOT test for the novel  Coronavirus (2019 nCoV)    Coronavirus HKU1 NOT DETECTED NOT DETECTED Final   Coronavirus NL63 NOT DETECTED NOT DETECTED Final   Coronavirus OC43 NOT DETECTED NOT DETECTED Final   Metapneumovirus NOT DETECTED NOT DETECTED Final   Rhinovirus / Enterovirus NOT DETECTED NOT DETECTED Final   Influenza A NOT DETECTED NOT DETECTED Final   Influenza B NOT DETECTED NOT DETECTED Final   Parainfluenza Virus 1 NOT DETECTED NOT DETECTED Final   Parainfluenza Virus 2 NOT DETECTED NOT DETECTED Final   Parainfluenza Virus 3 NOT DETECTED NOT DETECTED Final   Parainfluenza Virus 4 NOT DETECTED NOT DETECTED Final   Respiratory Syncytial Virus NOT DETECTED NOT DETECTED Final   Bordetella pertussis NOT DETECTED NOT DETECTED Final   Bordetella Parapertussis NOT DETECTED NOT DETECTED Final   Chlamydophila pneumoniae NOT DETECTED NOT DETECTED Final   Mycoplasma pneumoniae NOT DETECTED NOT DETECTED Final    Comment: Performed at [facility], [place]. [address], [place], [place] [address]  Blood Culture (routine x 2)     Status: None (Preliminary result)   Collection Time: 06/12/21 12:39 AM   Specimen: Left Antecubital; Blood  Result Value Ref Range Status   Specimen Description LEFT ANTECUBITAL  Final   Special Requests   Final    BOTTLES DRAWN AEROBIC AND ANAEROBIC Blood Culture adequate volume   Culture   Final    NO GROWTH 1 DAY Performed at [facility], [address] [place], [place] [address]    Report Status PENDING  Incomplete  Blood Culture (routine x 2)     Status: None (Preliminary result)    Collection Time: 06/12/21 12:44 AM   Specimen: Right Antecubital; Blood  Result Value Ref Range Status   Specimen Description RIGHT ANTECUBITAL  Final   Special Requests   Final    BOTTLES DRAWN AEROBIC AND ANAEROBIC Blood Culture results may not be optimal due to an excessive volume of blood received in culture bottles   Culture   Final    NO GROWTH 1 DAY Performed at [facility], [address], [place], [place] [address]    Report Status PENDING  Incomplete         Radiology Studies: CARDIAC CATHETERIZATION  Result Date: 06/13/2021   Prox LAD lesion is 50% stenosed.   There is severe left ventricular systolic dysfunction.  LV end diastolic pressure is mildly elevated.   The left ventricular ejection fraction is less than 25% by visual estimate.   No significant obstructive coronary disease   Normal right heart pressures   Dilated nonischemic cardiomyopathy Conclusion Normal right heart pressures Severely depressed left ventricular function consistent with a dilated nonischemic cardiomyopathy Ejection fraction less than 25% Coronaries No significant obstructive coronary artery disease left dominant system  DG Chest Portable 1 View  Result Date: 06/12/2021 CLINICAL DATA:  Shortness of breath. EXAM: PORTABLE CHEST 1 VIEW COMPARISON:  None. FINDINGS: Moderate to marked severity diffusely increased interstitial lung markings are seen bilaterally. Mild areas of atelectasis and/or infiltrate are seen along the bilateral infrahilar regions. There is no evidence of a pleural effusion or pneumothorax. The cardiac silhouette is mildly enlarged. The visualized skeletal structures are unremarkable. IMPRESSION: 1. Moderate to marked severity interstitial edema. 2. Mild bilateral infrahilar atelectasis and/or infiltrate. Electronically Signed   By: Virgina Norfolk M.D.   On: 06/12/2021 00:55   ECHOCARDIOGRAM COMPLETE  Result Date: 06/12/2021    ECHOCARDIOGRAM REPORT   Patient Name:    Cody OFFORD Sr. Date of Exam: 06/12/2021 Medical Rec #:  [id number]              Height:       71.0 in Accession #:    [id number]             Weight:       225.1 lb Date of Birth:  13-May-1941               BSA:          2.217 m Patient Age:    37 years               BP:           147/85 mmHg Patient Gender: M                      HR:           73 bpm. Exam Location:  [facility] Procedure: 2D Echo, Color Doppler and Cardiac Doppler Indications:     R06.03 Acute respiratory distress  History:         Patient has no prior history of Echocardiogram examinations.                  Risk Factors:Hypertension, Dyslipidemia and Sleep Apnea.  Sonographer:     Charmayne Sheer Referring Phys:  [id number] Ida Rogue SMITH Diagnosing Phys: Yolonda Kida MD  Sonographer Comments: Suboptimal subcostal window. IMPRESSIONS  1. Left ventricular ejection fraction, by estimation, is 25 to 30%. The left ventricle has severely decreased function. The left ventricle demonstrates global hypokinesis. The left ventricular internal cavity size was severely dilated. Left ventricular diastolic parameters are consistent with Grade I diastolic dysfunction (impaired relaxation).  2. Right ventricular systolic function is low normal. The right ventricular size is normal.  3. Left atrial size was mildly dilated.  4. Right atrial size was mildly dilated.  5. The mitral valve is normal in structure. Trivial mitral valve regurgitation.  6. The aortic valve is normal in structure. Aortic valve regurgitation is not visualized. FINDINGS  Left Ventricle: Left ventricular ejection fraction, by estimation, is 25 to 30%. The left ventricle has severely decreased function. The left ventricle demonstrates global hypokinesis. The left ventricular internal cavity size was severely dilated. There is no left ventricular hypertrophy. Left ventricular diastolic parameters are consistent with  Grade I diastolic dysfunction (impaired relaxation). Right Ventricle: The right  ventricular size is normal. No increase in right ventricular wall thickness. Right ventricular systolic function is low normal. Left Atrium: Left atrial size was mildly dilated. Right Atrium: Right atrial size was mildly dilated. Pericardium: There is no evidence of pericardial effusion. Mitral Valve: The mitral valve is normal in structure. Trivial mitral valve regurgitation. MV peak gradient, 6.5 mmHg. The mean mitral valve gradient is 3.5 mmHg. Tricuspid Valve: The tricuspid valve is normal in structure. Tricuspid valve regurgitation is mild. Aortic Valve: The aortic valve is normal in structure. Aortic valve regurgitation is not visualized. Aortic valve mean gradient measures 6.0 mmHg. Aortic valve peak gradient measures 9.9 mmHg. Aortic valve area, by VTI measures 1.84 cm. Pulmonic Valve: The pulmonic valve was grossly normal. Pulmonic valve regurgitation is not visualized. Aorta: The ascending aorta was not well visualized. IAS/Shunts: No atrial level shunt detected by color flow Doppler. Additional Comments: There is no pleural effusion.  LEFT VENTRICLE PLAX 2D LVIDd:         6.42 cm   Diastology LVIDs:         5.53 cm   LV e' medial:    4.46 cm/s LV PW:         1.15 cm   LV E/e' medial:  23.6 LV IVS:        0.94 cm   LV e' lateral:   11.60 cm/s LVOT diam:     2.00 cm   LV E/e' lateral: 9.1 LV SV:         55 LV SV Index:   25 LVOT Area:     3.14 cm  RIGHT VENTRICLE RV Basal diam:  2.82 cm RV S prime:     16.60 cm/s LEFT ATRIUM             Index        RIGHT ATRIUM           Index LA diam:        4.90 cm 2.21 cm/m   RA Area:     16.10 cm LA Vol (A2C):   73.0 ml 32.92 ml/m  RA Volume:   38.60 ml  17.41 ml/m LA Vol (A4C):   43.9 ml 19.80 ml/m LA Biplane Vol: 57.7 ml 26.02 ml/m  AORTIC VALVE                     PULMONIC VALVE AV Area (Vmax):    1.85 cm      PV Vmax:       1.25 m/s AV Area (Vmean):   1.77 cm      PV Vmean:      78.900 cm/s AV Area (VTI):     1.84 cm      PV VTI:        0.183 m AV Vmax:            157.00 cm/s   PV Peak grad:  6.2 mmHg AV Vmean:          114.000 cm/s  PV Mean grad:  3.0 mmHg AV VTI:            0.301 m AV Peak Grad:      9.9 mmHg AV Mean Grad:      6.0 mmHg LVOT Vmax:         92.40 cm/s LVOT Vmean:        64.100 cm/s LVOT VTI:  0.176 m LVOT/AV VTI ratio: 0.58  AORTA Ao Root diam: 3.10 cm MITRAL VALVE MV Area (PHT): 6.12 cm     SHUNTS MV Area VTI:   2.04 cm     Systemic VTI:  0.18 m MV Peak grad:  6.5 mmHg     Systemic Diam: 2.00 cm MV Mean grad:  3.5 mmHg MV Vmax:       1.27 m/s MV Vmean:      90.4 cm/s MV Decel Time: 124 msec MV E velocity: 105.20 cm/s MV A velocity: 119.50 cm/s MV E/A ratio:  0.88 Dwayne D Callwood MD Electronically signed by Yolonda Kida MD Signature Date/Time: 06/12/2021/2:15:05 PM    Final    US ABDOMEN LIMITED RUQ (LIVER/GB)  Result Date: 06/12/2021 CLINICAL DATA:  Bilirubinemia. EXAM: ULTRASOUND ABDOMEN LIMITED RIGHT UPPER QUADRANT COMPARISON:  None. FINDINGS: Gallbladder: No gallstones or wall thickening visualized (2.6 mm). No sonographic Murphy sign noted by sonographer. Common bile duct: Diameter: 3.8 mm Liver: No focal lesion identified. Mildly increased echogenicity of the liver parenchyma is noted. Portal vein is patent on color Doppler imaging with normal direction of blood flow towards the liver. Other: There is a small right pleural effusion. IMPRESSION: 1. Hepatic steatosis without focal liver lesions. 2. Small right pleural effusion. Electronically Signed   By: Virgina Norfolk M.D.   On: 06/12/2021 03:33        Scheduled Meds:  [START ON 06/14/2021] aspirin EC  81 mg Oral Daily   [MAR Hold] carvedilol  6.25 mg Oral BID WC   furosemide  40 mg Oral BID   [MAR Hold] rosuvastatin  10 mg Oral Daily   sacubitril-valsartan  1 tablet Oral BID   [MAR Hold] sodium chloride flush  3 mL Intravenous Q12H   [MAR Hold] sodium chloride flush  3 mL Intravenous Q12H   sodium chloride flush  3 mL Intravenous Q12H   [MAR Hold]  spironolactone  12.5 mg Oral Daily   Continuous Infusions:  [MAR Hold] sodium chloride     sodium chloride 1,000 mL (06/13/21 0845)   [START ON 06/14/2021] sodium chloride     sodium chloride       LOS: 1 day    Time spent: 30 minutes    Barb Merino, MD [facility] Pager [phone]

## 2021-06-13 NOTE — Plan of Care (Signed)
Continuing with plan of care. 

## 2021-06-14 DIAGNOSIS — I10 Essential (primary) hypertension: Secondary | ICD-10-CM | POA: Diagnosis not present

## 2021-06-14 DIAGNOSIS — G4733 Obstructive sleep apnea (adult) (pediatric): Secondary | ICD-10-CM

## 2021-06-14 DIAGNOSIS — I5021 Acute systolic (congestive) heart failure: Secondary | ICD-10-CM

## 2021-06-14 DIAGNOSIS — I420 Dilated cardiomyopathy: Secondary | ICD-10-CM | POA: Diagnosis not present

## 2021-06-14 DIAGNOSIS — N400 Enlarged prostate without lower urinary tract symptoms: Secondary | ICD-10-CM

## 2021-06-14 DIAGNOSIS — J9601 Acute respiratory failure with hypoxia: Secondary | ICD-10-CM | POA: Diagnosis not present

## 2021-06-14 DIAGNOSIS — E785 Hyperlipidemia, unspecified: Secondary | ICD-10-CM

## 2021-06-14 DIAGNOSIS — I428 Other cardiomyopathies: Secondary | ICD-10-CM

## 2021-06-14 LAB — BASIC METABOLIC PANEL
Anion gap: 6 (ref 5–15)
BUN: 30 mg/dL — ABNORMAL HIGH (ref 8–23)
CO2: 28 mmol/L (ref 22–32)
Calcium: 9.1 mg/dL (ref 8.9–10.3)
Chloride: 104 mmol/L (ref 98–111)
Creatinine, Ser: 0.86 mg/dL (ref 0.61–1.24)
GFR, Estimated: >60 mL/min (ref >60)
Glucose, Bld: 107 mg/dL — ABNORMAL HIGH (ref 70–99)
Potassium: 3.5 mmol/L (ref 3.5–5.1)
Sodium: 138 mmol/L (ref 135–145)

## 2021-06-14 LAB — BRAIN NATRIURETIC PEPTIDE: B Natriuretic Peptide: 808.7 pg/mL — ABNORMAL HIGH (ref 0.0–100.0)

## 2021-06-14 MED ORDER — SACUBITRIL-VALSARTAN 24-26 MG PO TABS: 1.0000 | ORAL_TABLET | Freq: Two times a day (BID) | ORAL | 0 refills | Status: DC

## 2021-06-14 MED ORDER — CARVEDILOL 6.25 MG PO TABS: 6.2500 mg | ORAL_TABLET | Freq: Two times a day (BID) | ORAL | 0 refills | Status: DC

## 2021-06-14 MED ORDER — SPIRONOLACTONE 25 MG PO TABS: 12.5000 mg | ORAL_TABLET | Freq: Every day | ORAL | 0 refills | Status: DC

## 2021-06-14 MED ORDER — FUROSEMIDE 40 MG PO TABS: 40.0000 mg | ORAL_TABLET | Freq: Two times a day (BID) | ORAL | 0 refills | Status: DC

## 2021-06-14 MED ORDER — METHOCARBAMOL 500 MG PO TABS
500.0000 mg | ORAL_TABLET | Freq: Three times a day (TID) | ORAL | Status: DC | PRN
  Administered 2021-06-14: 03:00:00 500 mg via ORAL
  Filled 2021-06-14 (×2): qty 1

## 2021-06-14 NOTE — Plan of Care (Signed)
Continuing with plan of care. 

## 2021-06-14 NOTE — Progress Notes (Signed)
Pt. Discharged to home via private vehicle with Son. Discharge instructions and medication regimen reviewed at bedside with patient and son. Pt. verbalizes understanding of instructions and medication regimen. Patient assessment unchanged from this morning. TELE and IV discontinued per policy.

## 2021-06-14 NOTE — Progress Notes (Signed)
[facility] Cardiology    SUBJECTIVE: Patient resting comfortably in bed no shortness of breath no leg swelling no palpitation tachycardia lightheadedness or weakness tolerated cardiac cath well awaiting LifeVest prior to discharge   Vitals:   06/14/21 0700 06/14/21 0800 06/14/21 0900 06/14/21 1000  BP: (!) 134/110 (!) 141/79 116/74 118/81  Pulse: 68 64 69 72  Resp: 13 13 13 19   Temp:      TempSrc:      SpO2: 94% 93% 93% 92%  Weight:      Height:         Intake/Output Summary (Last 24 hours) at 06/14/2021 1036 Last data filed at 06/14/2021 0600 Gross per 24 hour  Intake 282.5 ml  Output 1700 ml  Net -1417.5 ml      PHYSICAL EXAM  General: Well developed, well nourished, in no acute distress HEENT:  Normocephalic and atramatic Neck:  No JVD.  Lungs: Clear bilaterally to auscultation and percussion. Heart: HRRR . Normal S1 and S2 without gallops or murmurs.  Abdomen: Bowel sounds are positive, abdomen soft and non-tender  Msk:  Back normal, normal gait. Normal strength and tone for age. Extremities: No clubbing, cyanosis or edema.   Neuro: Alert and oriented X 3. Psych:  Good affect, responds appropriately   LABS: Basic Metabolic Panel: Recent Labs    06/12/21 0036 06/12/21 0039 06/13/21 0642 06/14/21 0538  NA  --    < > 139 138  K  --    < > 3.8 3.5  CL  --    < > 104 104  CO2  --    < > 27 28  GLUCOSE  --    < > 120* 107*  BUN  --    < > 32* 30*  CREATININE  --    < > 0.91 0.86  CALCIUM  --    < > 9.3 9.1  MG 2.1  --   --   --    < > = values in this interval not displayed.   Liver Function Tests: Recent Labs    06/12/21 0039  AST 53*  ALT 53*  ALKPHOS 46  BILITOT 1.6*  PROT 7.0  ALBUMIN 3.9   No results for input(s): LIPASE, AMYLASE in the last 72 hours. CBC: Recent Labs    06/12/21 0034  WBC 7.7  HGB 12.8*  HCT 39.4  MCV 90.2  PLT 187   Cardiac Enzymes: No results for input(s): CKTOTAL, CKMB, CKMBINDEX, TROPONINI in the last 72  hours. BNP: Invalid input(s): POCBNP D-Dimer: Recent Labs    06/12/21 0039  DDIMER 0.64*   Hemoglobin A1C: No results for input(s): HGBA1C in the last 72 hours. Fasting Lipid Panel: No results for input(s): CHOL, HDL, LDLCALC, TRIG, CHOLHDL, LDLDIRECT in the last 72 hours. Thyroid Function Tests: No results for input(s): TSH, T4TOTAL, T3FREE, THYROIDAB in the last 72 hours.  Invalid input(s): FREET3 Anemia Panel: No results for input(s): VITAMINB12, FOLATE, FERRITIN, TIBC, IRON, RETICCTPCT in the last 72 hours.  CARDIAC CATHETERIZATION  Result Date: 06/13/2021   Prox LAD lesion is 50% stenosed.   There is severe left ventricular systolic dysfunction.   LV end diastolic pressure is mildly elevated.   The left ventricular ejection fraction is less than 25% by visual estimate.   No significant obstructive coronary disease   Normal right heart pressures   Dilated nonischemic cardiomyopathy Conclusion Normal right heart pressures Severely depressed left ventricular function consistent with a dilated nonischemic cardiomyopathy Ejection fraction less  than 25% Coronaries No significant obstructive coronary artery disease left dominant system  ECHOCARDIOGRAM COMPLETE  Result Date: 06/12/2021    ECHOCARDIOGRAM REPORT   Patient Name:   Cody HEINDL Sr. Date of Exam: 06/12/2021 Medical Rec #:  [id number]              Height:       71.0 in Accession #:    [id number]             Weight:       225.1 lb Date of Birth:  Aug 13, 1940               BSA:          2.217 m Patient Age:    80 years               BP:           147/85 mmHg Patient Gender: M                      HR:           73 bpm. Exam Location:  [facility] Procedure: 2D Echo, Color Doppler and Cardiac Doppler Indications:     R06.03 Acute respiratory distress  History:         Patient has no prior history of Echocardiogram examinations.                  Risk Factors:Hypertension, Dyslipidemia and Sleep Apnea.  Sonographer:     Charmayne Sheer Referring  Phys:  [id number] Ida Rogue SMITH Diagnosing Phys: Yolonda Kida MD  Sonographer Comments: Suboptimal subcostal window. IMPRESSIONS  1. Left ventricular ejection fraction, by estimation, is 25 to 30%. The left ventricle has severely decreased function. The left ventricle demonstrates global hypokinesis. The left ventricular internal cavity size was severely dilated. Left ventricular diastolic parameters are consistent with Grade I diastolic dysfunction (impaired relaxation).  2. Right ventricular systolic function is low normal. The right ventricular size is normal.  3. Left atrial size was mildly dilated.  4. Right atrial size was mildly dilated.  5. The mitral valve is normal in structure. Trivial mitral valve regurgitation.  6. The aortic valve is normal in structure. Aortic valve regurgitation is not visualized. FINDINGS  Left Ventricle: Left ventricular ejection fraction, by estimation, is 25 to 30%. The left ventricle has severely decreased function. The left ventricle demonstrates global hypokinesis. The left ventricular internal cavity size was severely dilated. There is no left ventricular hypertrophy. Left ventricular diastolic parameters are consistent with Grade I diastolic dysfunction (impaired relaxation). Right Ventricle: The right ventricular size is normal. No increase in right ventricular wall thickness. Right ventricular systolic function is low normal. Left Atrium: Left atrial size was mildly dilated. Right Atrium: Right atrial size was mildly dilated. Pericardium: There is no evidence of pericardial effusion. Mitral Valve: The mitral valve is normal in structure. Trivial mitral valve regurgitation. MV peak gradient, 6.5 mmHg. The mean mitral valve gradient is 3.5 mmHg. Tricuspid Valve: The tricuspid valve is normal in structure. Tricuspid valve regurgitation is mild. Aortic Valve: The aortic valve is normal in structure. Aortic valve regurgitation is not visualized. Aortic valve mean gradient  measures 6.0 mmHg. Aortic valve peak gradient measures 9.9 mmHg. Aortic valve area, by VTI measures 1.84 cm. Pulmonic Valve: The pulmonic valve was grossly normal. Pulmonic valve regurgitation is not visualized. Aorta: The ascending aorta was not well visualized. IAS/Shunts: No atrial level shunt detected by color  flow Doppler. Additional Comments: There is no pleural effusion.  LEFT VENTRICLE PLAX 2D LVIDd:         6.42 cm   Diastology LVIDs:         5.53 cm   LV e' medial:    4.46 cm/s LV PW:         1.15 cm   LV E/e' medial:  23.6 LV IVS:        0.94 cm   LV e' lateral:   11.60 cm/s LVOT diam:     2.00 cm   LV E/e' lateral: 9.1 LV SV:         55 LV SV Index:   25 LVOT Area:     3.14 cm  RIGHT VENTRICLE RV Basal diam:  2.82 cm RV S prime:     16.60 cm/s LEFT ATRIUM             Index        RIGHT ATRIUM           Index LA diam:        4.90 cm 2.21 cm/m   RA Area:     16.10 cm LA Vol (A2C):   73.0 ml 32.92 ml/m  RA Volume:   38.60 ml  17.41 ml/m LA Vol (A4C):   43.9 ml 19.80 ml/m LA Biplane Vol: 57.7 ml 26.02 ml/m  AORTIC VALVE                     PULMONIC VALVE AV Area (Vmax):    1.85 cm      PV Vmax:       1.25 m/s AV Area (Vmean):   1.77 cm      PV Vmean:      78.900 cm/s AV Area (VTI):     1.84 cm      PV VTI:        0.183 m AV Vmax:           157.00 cm/s   PV Peak grad:  6.2 mmHg AV Vmean:          114.000 cm/s  PV Mean grad:  3.0 mmHg AV VTI:            0.301 m AV Peak Grad:      9.9 mmHg AV Mean Grad:      6.0 mmHg LVOT Vmax:         92.40 cm/s LVOT Vmean:        64.100 cm/s LVOT VTI:          0.176 m LVOT/AV VTI ratio: 0.58  AORTA Ao Root diam: 3.10 cm MITRAL VALVE MV Area (PHT): 6.12 cm     SHUNTS MV Area VTI:   2.04 cm     Systemic VTI:  0.18 m MV Peak grad:  6.5 mmHg     Systemic Diam: 2.00 cm MV Mean grad:  3.5 mmHg MV Vmax:       1.27 m/s MV Vmean:      90.4 cm/s MV Decel Time: 124 msec MV E velocity: 105.20 cm/s MV A velocity: 119.50 cm/s MV E/A ratio:  0.88 Cody Waight D Telesia Ates MD  Electronically signed by Yolonda Kida MD Signature Date/Time: 06/12/2021/2:15:05 PM    Final      Echo severely depressed left ventricular function EF less than 25% with dilation  TELEMETRY: Normal sinus rhythm nonspecific ST-T changes rate of 70:  ASSESSMENT AND PLAN:  Principal Problem:  Acute respiratory failure with hypoxia (HCC) Active Problems:   Essential hypertension   OSA (obstructive sleep apnea)   Prediabetes   Palpitations   Acute CHF (congestive heart failure) (HCC)   Transaminitis   Acute bronchitis   CHF exacerbation ([place])    Plan Patient resting comfortably post cardiac cath found to have a nonischemic cardiomyopathy recommend heart failure therapy with aggressive medical management Recommend LifeVest for 90 days then reassess left ventricular function EF less than 35% within recommend permanent AICD Recommend aggressive therapy for obstructive sleep apnea sleep study CPAP if indicated weight loss Hypertension control with Entresto spironolactone Coreg Shortness of breath continue Lasix therapy twice a day orally Continue inhalers as necessary Recommend low-dose statin therapy for moderate coronary disease Refer the patient to heart failure clinic Follow-up with cardiology 1 to 2 weeks   Yolonda Kida, MD 06/14/2021 10:36 AM

## 2021-06-14 NOTE — Discharge Summary (Signed)
[facility] [place] NAME: Cody Pollard    MR#:  [id number]  DATE OF BIRTH:  [birth year]  DATE OF ADMISSION:  06/12/2021 ADMITTING PHYSICIAN: Barb Merino, MD  DATE OF DISCHARGE: 06/14/2021  PRIMARY CARE PHYSICIAN: Kirk Ruths, MD    ADMISSION DIAGNOSIS:  Bilirubinemia [E80.6] CHF exacerbation (HCC) [I50.9] Acute CHF (congestive heart failure) (HCC) [I50.9] Acute respiratory failure with hypoxia (HCC) [J96.01] Acute congestive heart failure, unspecified heart failure type ([place]) [I50.9]  DISCHARGE DIAGNOSIS:  Principal Problem:   Acute respiratory failure with hypoxia (HCC) Active Problems:   Essential hypertension   OSA (obstructive sleep apnea)   Prediabetes   Palpitations   Acute CHF (congestive heart failure) (HCC)   Transaminitis   Acute bronchitis   CHF exacerbation (HCC)   SECONDARY DIAGNOSIS:   Past Medical History:  Diagnosis Date   BPH (benign prostatic hyperplasia)    Complication of anesthesia    Edema    MILD FEET/ANKLES OCCAS   History of kidney stones    Hyperlipidemia    Hypertension    Neuromuscular disorder (HCC)    TRIGEMINAL NEURALGIA   Palpitations    PONV (postoperative nausea and vomiting)    Sleep apnea    DOES NOT USE CPAP   Vertigo     HOSPITAL COURSE:   Acute hypoxic respiratory failure.  Present on admission.  Patient was diuresed and is now off oxygen completely. Acute nonischemic cardiomyopathy with acute systolic congestive heart failure.  Patient was diuresed with IV Lasix and switched over to Lasix 40 mg twice a day.  Patient was started on Aldactone Entresto and Coreg.  Cardiac cath showed an EF of less than 25% and proximal LAD 50% stenosed.  Echocardiogram showed an EF of 25 to 30%.  Cardiology recommended a LifeVest which should be delivered this evening if it is delivered he can be discharged this evening.  I explained to the patient that the LifeVest can sense a  life-threatening arrhythmia and deliver a shock to save his life.  He will need a repeat echocardiogram in about a month or so as outpatient to see if EF improves. Essential hypertension on Coreg Lasix Aldactone and Entresto Obstructive sleep apnea will need outpatient sleep study BPH can go back on Proscar Hyperlipidemia unspecified on Crestor Trigeminal neuralgia on gabapentin  DISCHARGE CONDITIONS:   Satisfactory  CONSULTS OBTAINED:  Cardiology  DRUG ALLERGIES:   Allergies  Allergen Reactions   Penicillins     DISCHARGE MEDICATIONS:   Allergies as of 06/14/2021       Reactions   Penicillins         Medication List     STOP taking these medications    amLODipine 5 MG tablet Commonly known as: NORVASC   HYDROcodone-acetaminophen 5-325 MG tablet Commonly known as: NORCO/VICODIN   predniSONE 20 MG tablet Commonly known as: DELTASONE       TAKE these medications    aspirin EC 81 MG tablet Take 81 mg by mouth daily.   carvedilol 6.25 MG tablet Commonly known as: COREG Take 1 tablet (6.25 mg total) by mouth 2 (two) times daily with a meal.   dutasteride 0.5 MG capsule Commonly known as: AVODART Take 0.5 mg by mouth daily.   furosemide 40 MG tablet Commonly known as: LASIX Take 1 tablet (40 mg total) by mouth 2 (two) times daily.   gabapentin 300 MG capsule Commonly known as: NEURONTIN Take 300 mg by mouth 3 (three) times  daily.   metaxalone 800 MG tablet Commonly known as: SKELAXIN Take 800 mg by mouth 3 (three) times daily.   rosuvastatin 10 MG tablet Commonly known as: CRESTOR Take 10 mg by mouth daily.   sacubitril-valsartan 24-26 MG Commonly known as: ENTRESTO Take 1 tablet by mouth 2 (two) times daily.   spironolactone 25 MG tablet Commonly known as: ALDACTONE Take 0.5 tablets (12.5 mg total) by mouth daily. Start taking on: June 15, 2021               Durable Medical Equipment  (From admission, onward)            Start     Ordered   06/14/21 1058  For home use only DME Vest life vest  Once        06/14/21 1057   06/14/21 1058  For home use only DME Vest life vest  Once       Comments: 3 months duration   06/14/21 1058             DISCHARGE INSTRUCTIONS:   Follow-up PMD 5 days Follow-up cardiology 1 week  If you experience worsening of your admission symptoms, develop shortness of breath, life threatening emergency, suicidal or homicidal thoughts you must seek medical attention immediately by calling 911 or calling your MD immediately  if symptoms less severe.  You Must read complete instructions/literature along with all the possible adverse reactions/side effects for all the Medicines you take and that have been prescribed to you. Take any new Medicines after you have completely understood and accept all the possible adverse reactions/side effects.   Please note  You were cared for by a hospitalist during your hospital stay. If you have any questions about your discharge medications or the care you received while you were in the hospital after you are discharged, you can call the unit and asked to speak with the hospitalist on call if the hospitalist that took care of you is not available. Once you are discharged, your primary care physician will handle any further medical issues. Please note that NO REFILLS for any discharge medications will be authorized once you are discharged, as it is imperative that you return to your primary care physician (or establish a relationship with a primary care physician if you do not have one) for your aftercare needs so that they can reassess your need for medications and monitor your lab values.    Today   CHIEF COMPLAINT:   Chief Complaint  Patient presents with   Shortness of Breath    HISTORY OF PRESENT ILLNESS:  Cody Pollard  is a 80 y.o. male came in with shortness of breath   VITAL SIGNS:   Blood pressure 124/87, pulse 69,  respirations 15, pulse ox 95% on room air.  I/O:   Intake/Output Summary (Last 24 hours) at 06/14/2021 1638 Last data filed at 06/14/2021 1517 Gross per 24 hour  Intake 282.5 ml  Output 1850 ml  Net -1567.5 ml    PHYSICAL EXAMINATION:  GENERAL:  80 y.o.-year-old patient lying in the bed with no acute distress.  EYES: Pupils equal, round, reactive to light and accommodation. No scleral icterus.  HEENT: Head atraumatic, normocephalic. Oropharynx and nasopharynx clear.  LUNGS: Normal breath sounds bilaterally, no wheezing, rales,rhonchi or crepitation. No use of accessory muscles of respiration.  CARDIOVASCULAR: S1, S2 normal. No murmurs, rubs, or gallops.  ABDOMEN: Soft, non-tender, non-distended. EXTREMITIES: No pedal edema.  NEUROLOGIC: Cranial nerves II through XII are  intact. Muscle strength 5/5 in all extremities. Sensation intact. Gait not checked.  PSYCHIATRIC: The patient is alert and oriented x 3.  SKIN: No obvious rash, lesion, or ulcer.   DATA REVIEW:   CBC Recent Labs  Lab 06/12/21 0034  WBC 7.7  HGB 12.8*  HCT 39.4  PLT 187    Chemistries  Recent Labs  Lab 06/12/21 0036 06/12/21 0039 06/13/21 0642 06/14/21 0538  NA  --  136   < > 138  K  --  4.1   < > 3.5  CL  --  107   < > 104  CO2  --  22   < > 28  GLUCOSE  --  101*   < > 107*  BUN  --  35*   < > 30*  CREATININE  --  0.88   < > 0.86  CALCIUM  --  8.7*   < > 9.1  MG 2.1  --   --   --   AST  --  53*  --   --   ALT  --  53*  --   --   ALKPHOS  --  46  --   --   BILITOT  --  1.6*  --   --    < > = values in this interval not displayed.     Microbiology Results  Results for orders placed or performed during the hospital encounter of 06/12/21  Resp Panel by RT-PCR (Flu A&B, Covid) Nasopharyngeal Swab     Status: None   Collection Time: 06/12/21 12:28 AM   Specimen: Nasopharyngeal Swab; Nasopharyngeal(NP) swabs in vial transport medium  Result Value Ref Range Status   SARS Coronavirus 2 by RT  PCR NEGATIVE NEGATIVE Final    Comment: (NOTE) SARS-CoV-2 target nucleic acids are NOT DETECTED.  The SARS-CoV-2 RNA is generally detectable in upper respiratory specimens during the acute phase of infection. The lowest concentration of SARS-CoV-2 viral copies this assay can detect is 138 copies/mL. A negative result does not preclude SARS-Cov-2 infection and should not be used as the sole basis for treatment or other patient management decisions. A negative result may occur with  improper specimen collection/handling, submission of specimen other than nasopharyngeal swab, presence of viral mutation(s) within the areas targeted by this assay, and inadequate number of viral copies(<138 copies/mL). A negative result must be combined with clinical observations, patient history, and epidemiological information. The expected result is Negative.  Fact Sheet for Patients:  EntrepreneurPulse.com.au  Fact Sheet for Healthcare Providers:  IncredibleEmployment.be  This test is no t yet approved or cleared by the [place] FDA and  has been authorized for detection and/or diagnosis of SARS-CoV-2 by FDA under an Emergency Use Authorization (EUA). This EUA will remain  in effect (meaning this test can be used) for the duration of the COVID-19 declaration under Section 564(b)(1) of the Act, 21 U.S.C.section 360bbb-3(b)(1), unless the authorization is terminated  or revoked sooner.       Influenza A by PCR NEGATIVE NEGATIVE Final   Influenza B by PCR NEGATIVE NEGATIVE Final    Comment: (NOTE) The Xpert Xpress SARS-CoV-2/FLU/RSV plus assay is intended as an aid in the diagnosis of influenza from Nasopharyngeal swab specimens and should not be used as a sole basis for treatment. Nasal washings and aspirates are unacceptable for Xpert Xpress SARS-CoV-2/FLU/RSV testing.  Fact Sheet for Patients: EntrepreneurPulse.com.au  Fact Sheet for  Healthcare Providers: IncredibleEmployment.be  This test is not yet approved  or cleared by the [place] and has been authorized for detection and/or diagnosis of SARS-CoV-2 by FDA under an Emergency Use Authorization (EUA). This EUA will remain in effect (meaning this test can be used) for the duration of the COVID-19 declaration under Section 564(b)(1) of the Act, 21 U.S.C. section 360bbb-3(b)(1), unless the authorization is terminated or revoked.  Performed at [facility], [place], [place] [address]   Respiratory (~20 pathogens) panel by PCR     Status: None   Collection Time: 06/12/21 12:36 AM   Specimen: Nasopharyngeal Swab; Respiratory  Result Value Ref Range Status   Adenovirus NOT DETECTED NOT DETECTED Final   Coronavirus 229E NOT DETECTED NOT DETECTED Final    Comment: (NOTE) The Coronavirus on the Respiratory Panel, DOES NOT test for the novel  Coronavirus (2019 nCoV)    Coronavirus HKU1 NOT DETECTED NOT DETECTED Final   Coronavirus NL63 NOT DETECTED NOT DETECTED Final   Coronavirus OC43 NOT DETECTED NOT DETECTED Final   Metapneumovirus NOT DETECTED NOT DETECTED Final   Rhinovirus / Enterovirus NOT DETECTED NOT DETECTED Final   Influenza A NOT DETECTED NOT DETECTED Final   Influenza B NOT DETECTED NOT DETECTED Final   Parainfluenza Virus 1 NOT DETECTED NOT DETECTED Final   Parainfluenza Virus 2 NOT DETECTED NOT DETECTED Final   Parainfluenza Virus 3 NOT DETECTED NOT DETECTED Final   Parainfluenza Virus 4 NOT DETECTED NOT DETECTED Final   Respiratory Syncytial Virus NOT DETECTED NOT DETECTED Final   Bordetella pertussis NOT DETECTED NOT DETECTED Final   Bordetella Parapertussis NOT DETECTED NOT DETECTED Final   Chlamydophila pneumoniae NOT DETECTED NOT DETECTED Final   Mycoplasma pneumoniae NOT DETECTED NOT DETECTED Final    Comment: Performed at [facility], [place]. [address], [place], [place] [address]  Blood  Culture (routine x 2)     Status: None (Preliminary result)   Collection Time: 06/12/21 12:39 AM   Specimen: Left Antecubital; Blood  Result Value Ref Range Status   Specimen Description LEFT ANTECUBITAL  Final   Special Requests   Final    BOTTLES DRAWN AEROBIC AND ANAEROBIC Blood Culture adequate volume   Culture   Final    NO GROWTH 2 DAYS Performed at [facility], [address] [place], [place] [address]    Report Status PENDING  Incomplete  Blood Culture (routine x 2)     Status: None (Preliminary result)   Collection Time: 06/12/21 12:44 AM   Specimen: Right Antecubital; Blood  Result Value Ref Range Status   Specimen Description RIGHT ANTECUBITAL  Final   Special Requests   Final    BOTTLES DRAWN AEROBIC AND ANAEROBIC Blood Culture results may not be optimal due to an excessive volume of blood received in culture bottles   Culture   Final    NO GROWTH 2 DAYS Performed at [facility], [address], [place], [place] [address]    Report Status PENDING  Incomplete  MRSA Next Gen by PCR, Nasal     Status: None   Collection Time: 06/13/21  4:34 PM   Specimen: Nasal Mucosa; Nasal Swab  Result Value Ref Range Status   MRSA by PCR Next Gen NOT DETECTED NOT DETECTED Final    Comment: (NOTE) The GeneXpert MRSA Assay (FDA approved for NASAL specimens only), is one component of a comprehensive MRSA colonization surveillance program. It is not intended to diagnose MRSA infection nor to guide or monitor treatment for MRSA  infections. Test performance is not FDA approved in patients less than 54 years old. Performed at [facility], [address] [place], [place] [address]     RADIOLOGY:  CARDIAC CATHETERIZATION  Result Date: 06/13/2021   Prox LAD lesion is 50% stenosed.   There is severe left ventricular systolic dysfunction.   LV end diastolic pressure is mildly elevated.   The left ventricular ejection fraction is less than 25% by visual  estimate.   No significant obstructive coronary disease   Normal right heart pressures   Dilated nonischemic cardiomyopathy Conclusion Normal right heart pressures Severely depressed left ventricular function consistent with a dilated nonischemic cardiomyopathy Ejection fraction less than 25% Coronaries No significant obstructive coronary artery disease left dominant system     Management plans discussed with the patient, family and they are in agreement.  CODE STATUS:     Code Status Orders  (From admission, onward)           Start     Ordered   06/12/21 0247  Full code  Continuous        06/12/21 0249           Code Status History     This patient has a current code status but no historical code status.       TOTAL TIME TAKING CARE OF THIS PATIENT: 34 minutes.    Loletha Grayer M.D on 06/14/2021 at 4:38 PM    [facility]  CC: Primary care physician; Kirk Ruths, MD

## 2021-06-14 NOTE — Progress Notes (Deleted)
Patient continues on ventilator and tolerating well with some intermittent agitation managed with ordered IV medication.  Patient remains in stable condition, will continue to monitor. °

## 2021-06-16 LAB — GLUCOSE, CAPILLARY: Glucose-Capillary: 99 mg/dL (ref 70–99)

## 2021-06-17 ENCOUNTER — Encounter: Payer: Self-pay | Admitting: Internal Medicine

## 2021-06-17 LAB — CULTURE, BLOOD (ROUTINE X 2)
Culture: NO GROWTH
Culture: NO GROWTH
Special Requests: ADEQUATE

## 2021-06-19 DIAGNOSIS — G4733 Obstructive sleep apnea (adult) (pediatric): Secondary | ICD-10-CM | POA: Diagnosis not present

## 2021-06-19 DIAGNOSIS — I5022 Chronic systolic (congestive) heart failure: Secondary | ICD-10-CM | POA: Diagnosis not present

## 2021-06-19 DIAGNOSIS — E7800 Pure hypercholesterolemia, unspecified: Secondary | ICD-10-CM | POA: Diagnosis not present

## 2021-06-19 DIAGNOSIS — R7303 Prediabetes: Secondary | ICD-10-CM | POA: Diagnosis not present

## 2021-06-19 DIAGNOSIS — I10 Essential (primary) hypertension: Secondary | ICD-10-CM | POA: Diagnosis not present

## 2021-06-26 DIAGNOSIS — E7800 Pure hypercholesterolemia, unspecified: Secondary | ICD-10-CM | POA: Diagnosis not present

## 2021-06-26 DIAGNOSIS — I4891 Unspecified atrial fibrillation: Secondary | ICD-10-CM | POA: Diagnosis not present

## 2021-06-26 DIAGNOSIS — I509 Heart failure, unspecified: Secondary | ICD-10-CM | POA: Diagnosis not present

## 2021-06-26 DIAGNOSIS — I429 Cardiomyopathy, unspecified: Secondary | ICD-10-CM | POA: Diagnosis not present

## 2021-06-26 DIAGNOSIS — I493 Ventricular premature depolarization: Secondary | ICD-10-CM | POA: Diagnosis not present

## 2021-06-26 DIAGNOSIS — I4892 Unspecified atrial flutter: Secondary | ICD-10-CM | POA: Diagnosis not present

## 2021-06-26 DIAGNOSIS — I739 Peripheral vascular disease, unspecified: Secondary | ICD-10-CM | POA: Diagnosis not present

## 2021-06-26 DIAGNOSIS — G4733 Obstructive sleep apnea (adult) (pediatric): Secondary | ICD-10-CM | POA: Diagnosis not present

## 2021-06-26 DIAGNOSIS — I10 Essential (primary) hypertension: Secondary | ICD-10-CM | POA: Diagnosis not present

## 2021-06-26 DIAGNOSIS — I5022 Chronic systolic (congestive) heart failure: Secondary | ICD-10-CM | POA: Diagnosis not present

## 2021-07-01 NOTE — Progress Notes (Signed)
Patient ID: Cody Caraway Sr., male    DOB: 03-26-1941, 81 y.o.   MRN: [id number]  HPI  Cody Pollard is a 81 y/o male with a history of atrial fibrillation, hyperlipidemia, HTN, BPH, kidney stones, trigeminal neuralgia, sleep apnea and chronic heart failure.   Echo report from 06/12/21 reviewed and showed an EF of 25-30%  RHC/LHC done 06/13/21 showed: Prox LAD lesion is 50% stenosed.   There is severe left ventricular systolic dysfunction.   LV end diastolic pressure is mildly elevated.   The left ventricular ejection fraction is less than 25% by visual estimate.   No significant obstructive coronary disease   Normal right heart pressures   Dilated nonischemic cardiomyopathy  Admitted 06/12/21 due to shortness of breath due to heart failure. Initially given IV lasix with transition to oral diuretics. Cardiology consult obtained. Medications adjusted and lifevest applied. Discharged the following day.   He presents today for his initial visit with a chief complaint of minimal fatigue upon moderate exertion. He describes this as chronic in nature. He has associated cough, palpitations, light-headedness and difficulty sleeping along with this. He denies any abdominal distention, pedal edema, chest pain, shortness of breath or weight gain.   Says that he weighs daily along with checking his HR often during the day. HR ranges from 50's-low 100's.  Past Medical History:  Diagnosis Date   Arrhythmia    atrial fibrillation   BPH (benign prostatic hyperplasia)    CHF (congestive heart failure) (HCC)    Complication of anesthesia    Edema    MILD FEET/ANKLES OCCAS   History of kidney stones    Hyperlipidemia    Hypertension    Neuromuscular disorder (HCC)    TRIGEMINAL NEURALGIA   Palpitations    PONV (postoperative nausea and vomiting)    Sleep apnea    DOES NOT USE CPAP   Vertigo    Past Surgical History:  Procedure Laterality Date   CATARACT EXTRACTION W/PHACO Left 09/23/2017    Procedure: CATARACT EXTRACTION PHACO AND INTRAOCULAR LENS PLACEMENT ([place]);  Surgeon: Eulogio Bear, MD;  Location: [facility] ORS;  Service: Ophthalmology;  Laterality: Left;  [place] 00:19 AP% 8.5 CDE 1.64 Fluid pak lot # [id number]   COLONOSCOPY WITH PROPOFOL N/A 05/06/2016   Procedure: COLONOSCOPY WITH PROPOFOL;  Surgeon: Manya Silvas, MD;  Location: [facility] ENDOSCOPY;  Service: Endoscopy;  Laterality: N/A;   HERNIA REPAIR     RIGHT/LEFT HEART CATH AND CORONARY ANGIOGRAPHY N/A 06/13/2021   Procedure: RIGHT/LEFT HEART CATH AND CORONARY ANGIOGRAPHY and possible PCI and stent;  Surgeon: Yolonda Kida, MD;  Location: [place] [facility];  Service: Cardiovascular;  Laterality: N/A;   History reviewed. No pertinent family history. Social History   Tobacco Use   Smoking status: Never   Smokeless tobacco: Never  Substance Use Topics   Alcohol use: No   Allergies  Allergen Reactions   Penicillins    Prior to Admission medications   Medication Sig Start Date End Date Taking? Authorizing Provider  aspirin EC 81 MG tablet Take 81 mg by mouth daily.   Yes [provider]  carvedilol (COREG) 6.25 MG tablet Take 1 tablet (6.25 mg total) by mouth 2 (two) times daily with a meal. 06/14/21  Yes Wieting, Richard, MD  dutasteride (AVODART) 0.5 MG capsule Take 0.5 mg by mouth daily. 12/24/13  Yes [provider]  furosemide (LASIX) 40 MG tablet Take 1 tablet (40 mg total) by mouth 2 (two) times daily.  06/14/21  Yes Wieting, Richard, MD  gabapentin (NEURONTIN) 300 MG capsule Take 300 mg by mouth 3 (three) times daily. 04/04/21  Yes [provider]  metaxalone (SKELAXIN) 800 MG tablet Take 800 mg by mouth 3 (three) times daily. 06/09/21  Yes [provider]  rosuvastatin (CRESTOR) 10 MG tablet Take 10 mg by mouth daily.   Yes [provider]  sacubitril-valsartan (ENTRESTO) 24-26 MG Take 1 tablet by mouth 2 (two) times daily. 06/14/21  Yes Wieting, Richard, MD   spironolactone (ALDACTONE) 25 MG tablet Take 0.5 tablets (12.5 mg total) by mouth daily. 06/15/21  Yes Loletha Grayer, MD   Review of Systems  Constitutional:  Positive for fatigue. Negative for appetite change.  HENT:  Negative for congestion, postnasal drip and sore throat.   Eyes: Negative.   Respiratory:  Positive for cough (dry cough). Negative for chest tightness and shortness of breath.   Cardiovascular:  Positive for palpitations. Negative for chest pain and leg swelling.  Gastrointestinal:  Negative for abdominal distention and abdominal pain.  Endocrine: Negative.   Genitourinary: Negative.   Musculoskeletal:  Negative for back pain and neck pain.  Skin: Negative.   Allergic/Immunologic: Negative.   Neurological:  Positive for light-headedness. Negative for dizziness and headaches.  Hematological:  Negative for adenopathy. Does not bruise/bleed easily.  Psychiatric/Behavioral:  Positive for sleep disturbance (not falling asleep easily; sleeping on 2 pillows). Negative for dysphoric mood. The patient is not nervous/anxious.    Vitals:   07/02/21 1235  BP: 91/66  Pulse: (!) 107  Resp: 18  SpO2: 98%  Weight: 207 lb 6 oz (94.1 kg)  Height: 5\' 10"  (1.778 m)   Wt Readings from Last 3 Encounters:  07/02/21 207 lb 6 oz (94.1 kg)  06/14/21 201 lb 1 oz (91.2 kg)  09/23/17 225 lb (102.1 kg)   Lab Results  Component Value Date   CREATININE 0.86 06/14/2021   CREATININE 0.91 06/13/2021   CREATININE 0.88 06/12/2021    Physical Exam Vitals and nursing note reviewed.  Constitutional:      Appearance: Normal appearance.  HENT:     Head: Normocephalic and atraumatic.  Cardiovascular:     Rate and Rhythm: Tachycardia present. Rhythm irregular.  Pulmonary:     Effort: Pulmonary effort is normal. No respiratory distress.     Breath sounds: No wheezing or rales.  Abdominal:     General: There is no distension.     Palpations: Abdomen is soft.  Musculoskeletal:         General: No tenderness.     Cervical back: Normal range of motion and neck supple.     Right lower leg: No edema.     Left lower leg: No edema.  Skin:    General: Skin is warm and dry.  Neurological:     General: No focal deficit present.     Mental Status: He is alert and oriented to person, place, and time.  Psychiatric:        Mood and Affect: Mood normal.        Behavior: Behavior normal.        Thought Content: Thought content normal.    Assessment & Plan:  1: Chronic heart failure with reduced ejection fraction- - NYHA class II - euvolemic today - weighing daily; reminded to call for an overnight weight gain of > 2 pounds or a weekly weight gain of > 5 pounds - not adding salt and has been looking at food labels; low  sodium cookbook provided - wearing lifevest without any shocks delivered - on GDMT of carvedilol, entresto & spironolactone - will decrease furosemide to 20mg  QAM & 20mg  QPM PRN - should BP get higher, consider adding SGLT2 - he's unsure of how much fluid he should drink and says that he loves milk; explained that he should keep daily fluid intake to 60-64 ounces daily which would include his mild consumption - BNP 06/14/21 was 808.8  2: HTN- - BP low (91/66); decreasing furosemide per above - saw PCP Ouida Sills) 06/19/21 - BMP 06/14/21 reviewed and showed sodium 138, potassium 3.5, creatinine 0.86 and GFR >60  3: Atrial fibrillation- - saw cardiology Clayborn Bigness) 06/26/21 - tachycardic in the office (107) but at home can be 50's-60's  Medication bottles reviewed.   Return in 1 month or sooner for any questions/problems before then.

## 2021-07-02 ENCOUNTER — Encounter: Payer: Self-pay | Admitting: Family

## 2021-07-02 ENCOUNTER — Ambulatory Visit: Payer: PPO | Attending: Family | Admitting: Family

## 2021-07-02 ENCOUNTER — Other Ambulatory Visit: Payer: Self-pay

## 2021-07-02 VITALS — BP 91/66 | HR 107 | Resp 18 | Ht 70.0 in | Wt 207.4 lb

## 2021-07-02 DIAGNOSIS — I4891 Unspecified atrial fibrillation: Secondary | ICD-10-CM | POA: Diagnosis not present

## 2021-07-02 DIAGNOSIS — G4730 Sleep apnea, unspecified: Secondary | ICD-10-CM | POA: Insufficient documentation

## 2021-07-02 DIAGNOSIS — G500 Trigeminal neuralgia: Secondary | ICD-10-CM | POA: Diagnosis not present

## 2021-07-02 DIAGNOSIS — I480 Paroxysmal atrial fibrillation: Secondary | ICD-10-CM

## 2021-07-02 DIAGNOSIS — I5022 Chronic systolic (congestive) heart failure: Secondary | ICD-10-CM | POA: Diagnosis not present

## 2021-07-02 DIAGNOSIS — Z79899 Other long term (current) drug therapy: Secondary | ICD-10-CM | POA: Diagnosis not present

## 2021-07-02 DIAGNOSIS — E785 Hyperlipidemia, unspecified: Secondary | ICD-10-CM | POA: Diagnosis not present

## 2021-07-02 DIAGNOSIS — N400 Enlarged prostate without lower urinary tract symptoms: Secondary | ICD-10-CM | POA: Insufficient documentation

## 2021-07-02 DIAGNOSIS — Z87442 Personal history of urinary calculi: Secondary | ICD-10-CM | POA: Diagnosis not present

## 2021-07-02 DIAGNOSIS — I420 Dilated cardiomyopathy: Secondary | ICD-10-CM | POA: Insufficient documentation

## 2021-07-02 DIAGNOSIS — R000 Tachycardia, unspecified: Secondary | ICD-10-CM | POA: Diagnosis not present

## 2021-07-02 DIAGNOSIS — I110 Hypertensive heart disease with heart failure: Secondary | ICD-10-CM | POA: Insufficient documentation

## 2021-07-02 DIAGNOSIS — I10 Essential (primary) hypertension: Secondary | ICD-10-CM | POA: Diagnosis not present

## 2021-07-02 NOTE — Patient Instructions (Addendum)
Continue weighing daily and call for an overnight weight gain of 3 pounds or more or a weekly weight gain of more than 5 pounds.     Drink 60-64 ounces of fluid daily.     Decrease your furosemide to 20mg  every morning and take an additional 20mg  if needed for above weight gain, any swelling or shortness of breath    [facility] will be moving around the corner to [id number] mid-February. Our phone number will remain the same

## 2021-07-09 DIAGNOSIS — I493 Ventricular premature depolarization: Secondary | ICD-10-CM | POA: Diagnosis not present

## 2021-07-09 DIAGNOSIS — I739 Peripheral vascular disease, unspecified: Secondary | ICD-10-CM | POA: Diagnosis not present

## 2021-07-09 DIAGNOSIS — I509 Heart failure, unspecified: Secondary | ICD-10-CM | POA: Diagnosis not present

## 2021-07-09 DIAGNOSIS — I4892 Unspecified atrial flutter: Secondary | ICD-10-CM | POA: Diagnosis not present

## 2021-07-09 DIAGNOSIS — Z95810 Presence of automatic (implantable) cardiac defibrillator: Secondary | ICD-10-CM | POA: Diagnosis not present

## 2021-07-09 DIAGNOSIS — I5022 Chronic systolic (congestive) heart failure: Secondary | ICD-10-CM | POA: Diagnosis not present

## 2021-07-09 DIAGNOSIS — I959 Hypotension, unspecified: Secondary | ICD-10-CM | POA: Diagnosis not present

## 2021-07-09 DIAGNOSIS — I4891 Unspecified atrial fibrillation: Secondary | ICD-10-CM | POA: Diagnosis not present

## 2021-07-09 DIAGNOSIS — I429 Cardiomyopathy, unspecified: Secondary | ICD-10-CM | POA: Diagnosis not present

## 2021-07-09 DIAGNOSIS — I10 Essential (primary) hypertension: Secondary | ICD-10-CM | POA: Diagnosis not present

## 2021-07-09 DIAGNOSIS — G4733 Obstructive sleep apnea (adult) (pediatric): Secondary | ICD-10-CM | POA: Diagnosis not present

## 2021-07-09 DIAGNOSIS — E782 Mixed hyperlipidemia: Secondary | ICD-10-CM | POA: Diagnosis not present

## 2021-07-14 DIAGNOSIS — I5022 Chronic systolic (congestive) heart failure: Secondary | ICD-10-CM | POA: Diagnosis not present

## 2021-07-14 DIAGNOSIS — Z0130 Encounter for examination of blood pressure without abnormal findings: Secondary | ICD-10-CM | POA: Diagnosis not present

## 2021-07-15 DIAGNOSIS — I420 Dilated cardiomyopathy: Secondary | ICD-10-CM | POA: Diagnosis not present

## 2021-07-17 DIAGNOSIS — D3131 Benign neoplasm of right choroid: Secondary | ICD-10-CM | POA: Diagnosis not present

## 2021-07-17 DIAGNOSIS — H2511 Age-related nuclear cataract, right eye: Secondary | ICD-10-CM | POA: Diagnosis not present

## 2021-07-21 DIAGNOSIS — I10 Essential (primary) hypertension: Secondary | ICD-10-CM | POA: Diagnosis not present

## 2021-07-21 DIAGNOSIS — I5022 Chronic systolic (congestive) heart failure: Secondary | ICD-10-CM | POA: Diagnosis not present

## 2021-07-21 DIAGNOSIS — I779 Disorder of arteries and arterioles, unspecified: Secondary | ICD-10-CM | POA: Diagnosis not present

## 2021-07-21 DIAGNOSIS — G4733 Obstructive sleep apnea (adult) (pediatric): Secondary | ICD-10-CM | POA: Diagnosis not present

## 2021-07-21 DIAGNOSIS — R7303 Prediabetes: Secondary | ICD-10-CM | POA: Diagnosis not present

## 2021-07-21 DIAGNOSIS — E7800 Pure hypercholesterolemia, unspecified: Secondary | ICD-10-CM | POA: Diagnosis not present

## 2021-07-23 DIAGNOSIS — I4891 Unspecified atrial fibrillation: Secondary | ICD-10-CM | POA: Diagnosis not present

## 2021-07-23 DIAGNOSIS — E782 Mixed hyperlipidemia: Secondary | ICD-10-CM | POA: Diagnosis not present

## 2021-07-23 DIAGNOSIS — I493 Ventricular premature depolarization: Secondary | ICD-10-CM | POA: Diagnosis not present

## 2021-07-23 DIAGNOSIS — I10 Essential (primary) hypertension: Secondary | ICD-10-CM | POA: Diagnosis not present

## 2021-07-23 DIAGNOSIS — I739 Peripheral vascular disease, unspecified: Secondary | ICD-10-CM | POA: Diagnosis not present

## 2021-07-23 DIAGNOSIS — I509 Heart failure, unspecified: Secondary | ICD-10-CM | POA: Diagnosis not present

## 2021-07-23 DIAGNOSIS — I959 Hypotension, unspecified: Secondary | ICD-10-CM | POA: Diagnosis not present

## 2021-07-23 DIAGNOSIS — G4733 Obstructive sleep apnea (adult) (pediatric): Secondary | ICD-10-CM | POA: Diagnosis not present

## 2021-07-23 DIAGNOSIS — I429 Cardiomyopathy, unspecified: Secondary | ICD-10-CM | POA: Diagnosis not present

## 2021-07-23 DIAGNOSIS — I5022 Chronic systolic (congestive) heart failure: Secondary | ICD-10-CM | POA: Diagnosis not present

## 2021-07-23 DIAGNOSIS — Z95810 Presence of automatic (implantable) cardiac defibrillator: Secondary | ICD-10-CM | POA: Diagnosis not present

## 2021-07-23 DIAGNOSIS — J9601 Acute respiratory failure with hypoxia: Secondary | ICD-10-CM | POA: Diagnosis not present

## 2021-08-04 DIAGNOSIS — G4733 Obstructive sleep apnea (adult) (pediatric): Secondary | ICD-10-CM | POA: Diagnosis not present

## 2021-08-05 ENCOUNTER — Ambulatory Visit: Payer: PPO | Attending: Family | Admitting: Family

## 2021-08-05 ENCOUNTER — Other Ambulatory Visit: Payer: Self-pay

## 2021-08-05 ENCOUNTER — Encounter: Payer: Self-pay | Admitting: Family

## 2021-08-05 VITALS — BP 104/68 | HR 55 | Resp 18 | Ht 66.0 in | Wt 207.4 lb

## 2021-08-05 DIAGNOSIS — I480 Paroxysmal atrial fibrillation: Secondary | ICD-10-CM

## 2021-08-05 DIAGNOSIS — I4891 Unspecified atrial fibrillation: Secondary | ICD-10-CM | POA: Diagnosis not present

## 2021-08-05 DIAGNOSIS — G4730 Sleep apnea, unspecified: Secondary | ICD-10-CM | POA: Diagnosis not present

## 2021-08-05 DIAGNOSIS — I110 Hypertensive heart disease with heart failure: Secondary | ICD-10-CM | POA: Diagnosis not present

## 2021-08-05 DIAGNOSIS — E785 Hyperlipidemia, unspecified: Secondary | ICD-10-CM | POA: Insufficient documentation

## 2021-08-05 DIAGNOSIS — I10 Essential (primary) hypertension: Secondary | ICD-10-CM

## 2021-08-05 DIAGNOSIS — G4733 Obstructive sleep apnea (adult) (pediatric): Secondary | ICD-10-CM

## 2021-08-05 DIAGNOSIS — N400 Enlarged prostate without lower urinary tract symptoms: Secondary | ICD-10-CM | POA: Insufficient documentation

## 2021-08-05 DIAGNOSIS — R008 Other abnormalities of heart beat: Secondary | ICD-10-CM | POA: Diagnosis not present

## 2021-08-05 DIAGNOSIS — Z87442 Personal history of urinary calculi: Secondary | ICD-10-CM | POA: Insufficient documentation

## 2021-08-05 DIAGNOSIS — I5022 Chronic systolic (congestive) heart failure: Secondary | ICD-10-CM | POA: Diagnosis not present

## 2021-08-05 NOTE — Progress Notes (Signed)
Patient ID: Cody Caraway Sr., male    DOB: March 21, 1941, 81 y.o.   MRN: [id number]    Cody Pollard is a 81 y/o male with a history of atrial fibrillation, hyperlipidemia, HTN, BPH, kidney stones, trigeminal neuralgia, sleep apnea and chronic heart failure.   Echo report from 06/12/21 reviewed and showed an EF of 25-30%  RHC/LHC done 06/13/21 showed: Prox LAD lesion is 50% stenosed.   There is severe left ventricular systolic dysfunction.   LV end diastolic pressure is mildly elevated.   The left ventricular ejection fraction is less than 25% by visual estimate.   No significant obstructive coronary disease   Normal right heart pressures   Dilated nonischemic cardiomyopathy  Admitted 06/12/21 due to shortness of breath due to heart failure. Initially given IV lasix with transition to oral diuretics. Cardiology consult obtained. Medications adjusted and lifevest applied. Discharged the following day.   He presents today for a follow up visit with a chief complaint of minimal fatigue upon moderate exertion. He describes this as chronic in nature. He has associated cough, palpitations, light-headedness and difficulty sleeping along with this. He denies any abdominal distention, pedal edema, chest pain, shortness of breath or weight gain.   Says that he weighs daily along with checking his HR often during the day. HR ranges from 50's-low 100's. Presents BP log, they are a few readings with 12'X diastolic, he reports a few days of dizziness. Saw his cardiologist for this on 07/09/21 and his spironolactone was decreased to 12.5 mg QD, and entresto to 1/2 tablet BID of the 24/26mg  dose  Past Medical History:  Diagnosis Date   Arrhythmia    atrial fibrillation   BPH (benign prostatic hyperplasia)    CHF (congestive heart failure) (HCC)    Complication of anesthesia    Edema    MILD FEET/ANKLES OCCAS   History of kidney stones    Hyperlipidemia    Hypertension    Neuromuscular disorder  (HCC)    TRIGEMINAL NEURALGIA   Palpitations    PONV (postoperative nausea and vomiting)    Sleep apnea    DOES NOT USE CPAP   Vertigo    Past Surgical History:  Procedure Laterality Date   CATARACT EXTRACTION W/PHACO Left 09/23/2017   Procedure: CATARACT EXTRACTION PHACO AND INTRAOCULAR LENS PLACEMENT ([place]);  Surgeon: Eulogio Bear, MD;  Location: [facility] ORS;  Service: Ophthalmology;  Laterality: Left;  [place] 00:19 AP% 8.5 CDE 1.64 Fluid pak lot # [id number]   COLONOSCOPY WITH PROPOFOL N/A 05/06/2016   Procedure: COLONOSCOPY WITH PROPOFOL;  Surgeon: Manya Silvas, MD;  Location: [facility] ENDOSCOPY;  Service: Endoscopy;  Laterality: N/A;   HERNIA REPAIR     RIGHT/LEFT HEART CATH AND CORONARY ANGIOGRAPHY N/A 06/13/2021   Procedure: RIGHT/LEFT HEART CATH AND CORONARY ANGIOGRAPHY and possible PCI and stent;  Surgeon: Yolonda Kida, MD;  Location: [place] [facility];  Service: Cardiovascular;  Laterality: N/A;   No family history on file. Social History   Tobacco Use   Smoking status: Never   Smokeless tobacco: Never  Substance Use Topics   Alcohol use: No   Allergies  Allergen Reactions   Penicillins    Prior to Admission medications   Medication Sig Start Date End Date Taking? Authorizing Provider  aspirin EC 81 MG tablet Take 81 mg by mouth daily.   Yes [provider]  carvedilol (COREG) 6.25 MG tablet Take 1 tablet (6.25 mg total) by mouth 2 (two) times daily with  a meal. 06/14/21  Yes Wieting, Richard, MD  dutasteride (AVODART) 0.5 MG capsule Take 0.5 mg by mouth daily. 12/24/13  Yes [provider]  furosemide (LASIX) 40 MG tablet Take 1 tablet (40 mg total) by mouth 2 (two) times daily. 06/14/21  Yes Wieting, Richard, MD  gabapentin (NEURONTIN) 300 MG capsule Take 300 mg by mouth 3 (three) times daily. 04/04/21  Yes [provider]  metaxalone (SKELAXIN) 800 MG tablet Take 800 mg by mouth 3 (three) times daily. 06/09/21  Yes [provider]  rosuvastatin (CRESTOR) 10 MG tablet Take 10 mg by mouth daily.   Yes [provider]  sacubitril-valsartan (ENTRESTO) 24-26 MG Take 0.5 tablet by mouth 2 (two) times daily. 06/14/21  Yes Wieting, Richard, MD  spironolactone (ALDACTONE) 25 MG tablet Take 0.5 tablets (12.5 mg total) by mouth daily. 06/15/21  Yes Loletha Grayer, MD   Review of Systems  Constitutional:  Positive for fatigue. Negative for appetite change.  HENT:  Negative for congestion, postnasal drip and sore throat.   Eyes: Negative.   Respiratory:  Positive for cough. Negative for chest tightness and shortness of breath.   Cardiovascular:  Positive for palpitations. Negative for chest pain and leg swelling.  Gastrointestinal:  Negative for abdominal distention and abdominal pain.  Endocrine: Negative.   Genitourinary: Negative.   Musculoskeletal:  Negative for back pain and neck pain.  Skin: Negative.   Allergic/Immunologic: Negative.   Neurological:  Positive for dizziness and light-headedness. Negative for headaches.  Hematological:  Negative for adenopathy. Does not bruise/bleed easily.  Psychiatric/Behavioral:  Positive for sleep disturbance (not falling asleep easily; sleeping on 2 pillows). Negative for dysphoric mood. The patient is not nervous/anxious.    Vitals:   08/05/21 1430  BP: 104/68  Pulse: (!) 55  Resp: 18  SpO2: 98%  Weight: 207 lb 7 oz (94.1 kg)  Height: 5\' 6"  (1.676 m)   Wt Readings from Last 3 Encounters:  08/05/21 207 lb 7 oz (94.1 kg)  07/02/21 207 lb 6 oz (94.1 kg)  06/14/21 201 lb 1 oz (91.2 kg)   Lab Results  Component Value Date   CREATININE 0.86 06/14/2021   CREATININE 0.91 06/13/2021   CREATININE 0.88 06/12/2021    Physical Exam Vitals and nursing note reviewed.  Constitutional:      Appearance: Normal appearance.  HENT:     Head: Normocephalic and atraumatic.  Cardiovascular:     Rate and Rhythm: Bradycardia present. Rhythm irregular.   Pulmonary:     Effort: Pulmonary effort is normal. No respiratory distress.     Breath sounds: No wheezing or rales.  Abdominal:     General: There is no distension.     Palpations: Abdomen is soft.  Musculoskeletal:        General: No tenderness.     Cervical back: Normal range of motion and neck supple.     Right lower leg: No edema.     Left lower leg: No edema.  Skin:    General: Skin is warm and dry.  Neurological:     General: No focal deficit present.     Mental Status: He is alert and oriented to person, place, and time.  Psychiatric:        Mood and Affect: Mood normal.        Behavior: Behavior normal.        Thought Content: Thought content normal.    Assessment & Plan:  1: Chronic heart failure with reduced ejection  fraction- - NYHA class II - euvolemic today - weighing daily; reminded to call for an overnight weight gain of > 2 pounds or a weekly weight gain of > 5 pounds - weight unchanged from last visit here 1 month ago - not adding salt and has been looking at food labels - wearing lifevest without any shocks delivered - on GDMT of carvedilol, entresto & spironolactone - saw cardiologist 07/09/21, entresto dose decreased to half tablet in am and half in evening; spironolactone decreased to 12.5 QD - will have repeat echo on 08/25/21 - will see [place] on 09/01/21 - should BP get higher, consider adding SGLT2 - adhering to 64 ounces/day of fluid - BNP 06/14/21 was 808.8  2: HTN- - BP 104/68, home BP reading was 117/64 - saw PCP Ouida Sills) 06/19/21 - BMP 06/14/21 reviewed and showed sodium 138, potassium 3.5, creatinine 0.86 and GFR >60  3: [place]-  - saw cardiology ([place]) 07/23/21 - HR 55 in office  4: Sleep apnea- - had home sleep test last night   Medication bottles reviewed.   Return in 2 months or sooner for any questions/problems before then.

## 2021-08-05 NOTE — Patient Instructions (Signed)
Return in 2 months.  Call us if you have any HF issues or concerns prior to your visit.

## 2021-08-07 DIAGNOSIS — I5022 Chronic systolic (congestive) heart failure: Secondary | ICD-10-CM | POA: Diagnosis not present

## 2021-08-07 DIAGNOSIS — M25562 Pain in left knee: Secondary | ICD-10-CM | POA: Diagnosis not present

## 2021-08-07 DIAGNOSIS — S86112A Strain of other muscle(s) and tendon(s) of posterior muscle group at lower leg level, left leg, initial encounter: Secondary | ICD-10-CM | POA: Diagnosis not present

## 2021-08-09 DIAGNOSIS — G4733 Obstructive sleep apnea (adult) (pediatric): Secondary | ICD-10-CM | POA: Diagnosis not present

## 2021-08-15 DIAGNOSIS — I420 Dilated cardiomyopathy: Secondary | ICD-10-CM | POA: Diagnosis not present

## 2021-08-25 DIAGNOSIS — I509 Heart failure, unspecified: Secondary | ICD-10-CM | POA: Diagnosis not present

## 2021-08-25 DIAGNOSIS — I429 Cardiomyopathy, unspecified: Secondary | ICD-10-CM | POA: Diagnosis not present

## 2021-09-01 DIAGNOSIS — E7800 Pure hypercholesterolemia, unspecified: Secondary | ICD-10-CM | POA: Diagnosis not present

## 2021-09-01 DIAGNOSIS — I959 Hypotension, unspecified: Secondary | ICD-10-CM | POA: Diagnosis not present

## 2021-09-01 DIAGNOSIS — I4891 Unspecified atrial fibrillation: Secondary | ICD-10-CM | POA: Diagnosis not present

## 2021-09-01 DIAGNOSIS — E782 Mixed hyperlipidemia: Secondary | ICD-10-CM | POA: Diagnosis not present

## 2021-09-01 DIAGNOSIS — I493 Ventricular premature depolarization: Secondary | ICD-10-CM | POA: Diagnosis not present

## 2021-09-01 DIAGNOSIS — Z95810 Presence of automatic (implantable) cardiac defibrillator: Secondary | ICD-10-CM | POA: Diagnosis not present

## 2021-09-01 DIAGNOSIS — I10 Essential (primary) hypertension: Secondary | ICD-10-CM | POA: Diagnosis not present

## 2021-09-01 DIAGNOSIS — I739 Peripheral vascular disease, unspecified: Secondary | ICD-10-CM | POA: Diagnosis not present

## 2021-09-01 DIAGNOSIS — I5022 Chronic systolic (congestive) heart failure: Secondary | ICD-10-CM | POA: Diagnosis not present

## 2021-09-01 DIAGNOSIS — I429 Cardiomyopathy, unspecified: Secondary | ICD-10-CM | POA: Diagnosis not present

## 2021-09-01 DIAGNOSIS — G4733 Obstructive sleep apnea (adult) (pediatric): Secondary | ICD-10-CM | POA: Diagnosis not present

## 2021-09-01 DIAGNOSIS — I509 Heart failure, unspecified: Secondary | ICD-10-CM | POA: Diagnosis not present

## 2021-09-09 DIAGNOSIS — G4733 Obstructive sleep apnea (adult) (pediatric): Secondary | ICD-10-CM | POA: Diagnosis not present

## 2021-09-12 DIAGNOSIS — I420 Dilated cardiomyopathy: Secondary | ICD-10-CM | POA: Diagnosis not present

## 2021-10-04 NOTE — Progress Notes (Signed)
? Patient ID: Cody Caraway Sr., male    DOB: Sep 26, 1940, 81 y.o.   MRN: [id number] ? ? ?Cody Pollard is a 81 y/o male with a history of atrial fibrillation, hyperlipidemia, HTN, BPH, kidney stones, trigeminal neuralgia, sleep apnea and chronic heart failure.  ? ?Echo report from 08/25/21 reviewed and showed an EF of 30%. Echo report from 06/12/21 reviewed and showed an EF of 25-30% ? ?RHC/LHC done 06/13/21 showed: ?Prox LAD lesion is 50% stenosed. ?  There is severe left ventricular systolic dysfunction. ?  LV end diastolic pressure is mildly elevated. ?  The left ventricular ejection fraction is less than 25% by visual estimate. ?  No significant obstructive coronary disease ?  Normal right heart pressures ?  Dilated nonischemic cardiomyopathy ? ?Admitted 06/12/21 due to shortness of breath due to heart failure. Initially given IV lasix with transition to oral diuretics. Cardiology consult obtained. Medications adjusted and lifevest applied. Discharged the following day.  ? ?He presents today for a follow up visit with a chief complaint of minimal fatigue upon moderate exertion. Describes this as chronic in nature. He has associated shortness of breath, palpitations, dizziness and chronic difficulty sleeping along with this. He denies any abdominal distention, pedal edema, chest pain, cough or weight gain.  ? ?Now wearing CPAP every night for about 5-6 hours/ night. Has an appointment with EP in 2 days to discuss AICD. Continues to wear lifevest but without any shocks reported.  ? ?Past Medical History:  ?Diagnosis Date  ? Arrhythmia   ? atrial fibrillation  ? BPH (benign prostatic hyperplasia)   ? CHF (congestive heart failure) ([place])   ? Complication of anesthesia   ? Edema   ? MILD FEET/ANKLES OCCAS  ? History of kidney stones   ? Hyperlipidemia   ? Hypertension   ? Neuromuscular disorder ([place])   ? TRIGEMINAL NEURALGIA  ? Palpitations   ? PONV (postoperative nausea and vomiting)   ? Sleep apnea   ? DOES NOT USE  CPAP  ? Vertigo   ? ?Past Surgical History:  ?Procedure Laterality Date  ? CATARACT EXTRACTION W/PHACO Left 09/23/2017  ? Procedure: CATARACT EXTRACTION PHACO AND INTRAOCULAR LENS PLACEMENT ([facility]);  Surgeon: Eulogio Bear, MD;  Location: [facility] ORS;  Service: Ophthalmology;  Laterality: Left;  [place] 00:19 ?AP% 8.5 ?CDE 1.64 ?Fluid pak lot # [id number]  ? COLONOSCOPY WITH PROPOFOL N/A 05/06/2016  ? Procedure: COLONOSCOPY WITH PROPOFOL;  Surgeon: Manya Silvas, MD;  Location: [facility] ENDOSCOPY;  Service: Endoscopy;  Laterality: N/A;  ? HERNIA REPAIR    ? RIGHT/LEFT HEART CATH AND CORONARY ANGIOGRAPHY N/A 06/13/2021  ? Procedure: RIGHT/LEFT HEART CATH AND CORONARY ANGIOGRAPHY and possible PCI and stent;  Surgeon: Yolonda Kida, MD;  Location: [place] [facility];  Service: Cardiovascular;  Laterality: N/A;  ? ?No family history on file. ?Social History  ? ?Tobacco Use  ? Smoking status: Never  ? Smokeless tobacco: Never  ?Substance Use Topics  ? Alcohol use: No  ? ?Allergies  ?Allergen Reactions  ? Penicillins   ? ?Prior to Admission medications   ?Medication Sig Start Date End Date Taking? Authorizing Provider  ?aspirin EC 81 MG tablet Take 81 mg by mouth daily.   Yes [provider]  ?carvedilol (COREG) 6.25 MG tablet Take 1 tablet (6.25 mg total) by mouth 2 (two) times daily with a meal. 06/14/21  Yes Wieting, Richard, MD  ?dutasteride (AVODART) 0.5 MG capsule Take 0.5 mg by mouth daily. 12/24/13  Yes [provider]  ?furosemide (LASIX) 20 MG tablet Take 20 mg by mouth daily.   Yes [provider]  ?gabapentin (NEURONTIN) 300 MG capsule Take 300 mg by mouth 3 (three) times daily. 04/04/21  Yes [provider]  ?rosuvastatin (CRESTOR) 10 MG tablet Take 10 mg by mouth daily.   Yes [provider]  ?sacubitril-valsartan (ENTRESTO) 24-26 MG Take 1 tablet by mouth 2 (two) times daily. ?Patient taking differently: Take 0.5 tablets by mouth 2 (two) times daily. 06/14/21  Yes  Wieting, Richard, MD  ?spironolactone (ALDACTONE) 25 MG tablet Take 0.5 tablets (12.5 mg total) by mouth daily. 06/15/21  Yes Loletha Grayer, MD  ? ? ?Review of Systems  ?Constitutional:  Positive for fatigue. Negative for appetite change.  ?HENT:  Negative for congestion, postnasal drip and sore throat.   ?Eyes: Negative.   ?Respiratory:  Positive for shortness of breath (at times). Negative for cough and chest tightness.   ?Cardiovascular:  Positive for palpitations. Negative for chest pain and leg swelling.  ?Gastrointestinal:  Negative for abdominal distention and abdominal pain.  ?Endocrine: Negative.   ?Genitourinary: Negative.   ?Musculoskeletal:  Negative for back pain and neck pain.  ?Skin: Negative.   ?Allergic/Immunologic: Negative.   ?Neurological:  Positive for dizziness and light-headedness. Negative for headaches.  ?Hematological:  Negative for adenopathy. Does not bruise/bleed easily.  ?Psychiatric/Behavioral:  Positive for sleep disturbance (not falling asleep easily; sleeping on 2 pillows). Negative for dysphoric mood. The patient is not nervous/anxious.   ? ?Vitals:  ? 10/06/21 1445  ?BP: 113/61  ?Pulse: (!) 58  ?Resp: 18  ?SpO2: 98%  ?Weight: 215 lb 6 oz (97.7 kg)  ?Height: '5\' 10"'$  (1.778 m)  ? ?Wt Readings from Last 3 Encounters:  ?10/06/21 215 lb 6 oz (97.7 kg)  ?08/05/21 207 lb 7 oz (94.1 kg)  ?07/02/21 207 lb 6 oz (94.1 kg)  ? ?Lab Results  ?Component Value Date  ? CREATININE 0.86 06/14/2021  ? CREATININE 0.91 06/13/2021  ? CREATININE 0.88 06/12/2021  ? ?Physical Exam ?Vitals and nursing note reviewed.  ?Constitutional:   ?   Appearance: Normal appearance.  ?HENT:  ?   Head: Normocephalic and atraumatic.  ?Cardiovascular:  ?   Rate and Rhythm: Bradycardia present. Rhythm irregular.  ?Pulmonary:  ?   Effort: Pulmonary effort is normal. No respiratory distress.  ?   Breath sounds: No wheezing or rales.  ?Abdominal:  ?   General: There is no distension.  ?   Palpations: Abdomen is soft.   ?Musculoskeletal:     ?   General: No tenderness.  ?   Cervical back: Normal range of motion and neck supple.  ?   Right lower leg: No edema.  ?   Left lower leg: No edema.  ?Skin: ?   General: Skin is warm and dry.  ?Neurological:  ?   General: No focal deficit present.  ?   Mental Status: He is alert and oriented to person, place, and time.  ?Psychiatric:     ?   Mood and Affect: Mood normal.     ?   Behavior: Behavior normal.     ?   Thought Content: Thought content normal.  ?  ?Assessment & Plan: ? ?1: Chronic heart failure with reduced ejection fraction- ?- NYHA class II ?- euvolemic today ?- weighing daily; reminded to call for an overnight weight gain of > 2 pounds or a weekly weight gain of > 5 pounds ?- weight up  8 pounds from last visit here 2 months ago ?- not adding salt and has been looking at food labels ?- wearing lifevest without any shocks delivered ?- has EP appointment scheduled in 2 days ?- on GDMT of carvedilol, entresto & spironolactone ?- will begin farxiga '10mg'$  daily ?- 30 day voucher given today ?- will check BMP next visit ?- farxiga/entresto patient assistance forms filled out today ?- adhering to 64 ounces/day of fluid ?- BNP 06/14/21 was 808.8 ? ?2: HTN- ?- BP looks good (113/61) ?- saw PCP Ouida Sills) 06/19/21 ?- BMP 07/14/21 reviewed and showed sodium 142, potassium 4.8, creatinine 1.1 and GFR 64 ? ?3: PAF-  ?- saw cardiology Clayborn Bigness) 09/01/21 ?- rate controlled on carvedilol ? ?4: Sleep apnea- ?- now wearing CPAP 5-6 hours/ night and is getting slowly adjusted to it ?- is trying to figure out which mask will work better for him ? ? ?Medication bottles reviewed.  ? ?Return in 1 month, sooner if needed.  ? ? ?

## 2021-10-06 ENCOUNTER — Ambulatory Visit: Payer: PPO | Attending: Family | Admitting: Family

## 2021-10-06 ENCOUNTER — Encounter: Payer: Self-pay | Admitting: Family

## 2021-10-06 VITALS — BP 113/61 | HR 58 | Resp 18 | Ht 70.0 in | Wt 215.4 lb

## 2021-10-06 DIAGNOSIS — E785 Hyperlipidemia, unspecified: Secondary | ICD-10-CM | POA: Insufficient documentation

## 2021-10-06 DIAGNOSIS — G4733 Obstructive sleep apnea (adult) (pediatric): Secondary | ICD-10-CM

## 2021-10-06 DIAGNOSIS — I5022 Chronic systolic (congestive) heart failure: Secondary | ICD-10-CM

## 2021-10-06 DIAGNOSIS — I480 Paroxysmal atrial fibrillation: Secondary | ICD-10-CM

## 2021-10-06 DIAGNOSIS — I110 Hypertensive heart disease with heart failure: Secondary | ICD-10-CM | POA: Insufficient documentation

## 2021-10-06 DIAGNOSIS — G4730 Sleep apnea, unspecified: Secondary | ICD-10-CM | POA: Diagnosis not present

## 2021-10-06 DIAGNOSIS — Z87442 Personal history of urinary calculi: Secondary | ICD-10-CM | POA: Diagnosis not present

## 2021-10-06 DIAGNOSIS — I10 Essential (primary) hypertension: Secondary | ICD-10-CM

## 2021-10-06 DIAGNOSIS — N400 Enlarged prostate without lower urinary tract symptoms: Secondary | ICD-10-CM | POA: Diagnosis not present

## 2021-10-06 MED ORDER — SACUBITRIL-VALSARTAN 24-26 MG PO TABS: 1.0000 | ORAL_TABLET | Freq: Two times a day (BID) | ORAL | 3 refills | Status: DC

## 2021-10-06 MED ORDER — DAPAGLIFLOZIN PROPANEDIOL 10 MG PO TABS: 10.0000 mg | ORAL_TABLET | Freq: Every day | ORAL | 3 refills | Status: DC

## 2021-10-06 MED ORDER — DAPAGLIFLOZIN PROPANEDIOL 10 MG PO TABS: 10.0000 mg | ORAL_TABLET | Freq: Every day | ORAL | 5 refills | Status: DC

## 2021-10-06 NOTE — Patient Instructions (Addendum)
Continue weighing daily and call for an overnight weight gain of 3 pounds or more or a weekly weight gain of more than 5 pounds. ? ?If you have voicemail, please make sure your mailbox is cleaned out so that we may leave a message and please make sure to listen to any voicemails.  ? ? ?Will start farxiga as 1 tablet every morning ?

## 2021-10-08 ENCOUNTER — Ambulatory Visit (INDEPENDENT_AMBULATORY_CARE_PROVIDER_SITE_OTHER): Payer: PPO | Admitting: Cardiology

## 2021-10-08 ENCOUNTER — Telehealth: Payer: Self-pay | Admitting: Family

## 2021-10-08 ENCOUNTER — Encounter: Payer: Self-pay | Admitting: Cardiology

## 2021-10-08 ENCOUNTER — Other Ambulatory Visit
Admission: RE | Admit: 2021-10-08 | Discharge: 2021-10-08 | Disposition: A | Discharge status: Home / Self Care | Payer: PPO | Attending: Cardiology | Admitting: Cardiology

## 2021-10-08 VITALS — BP 122/62 | HR 55 | Ht 70.0 in | Wt 212.0 lb

## 2021-10-08 DIAGNOSIS — I493 Ventricular premature depolarization: Secondary | ICD-10-CM | POA: Diagnosis not present

## 2021-10-08 DIAGNOSIS — I5022 Chronic systolic (congestive) heart failure: Secondary | ICD-10-CM | POA: Diagnosis not present

## 2021-10-08 DIAGNOSIS — G4733 Obstructive sleep apnea (adult) (pediatric): Secondary | ICD-10-CM | POA: Diagnosis not present

## 2021-10-08 DIAGNOSIS — I480 Paroxysmal atrial fibrillation: Secondary | ICD-10-CM | POA: Insufficient documentation

## 2021-10-08 LAB — COMPREHENSIVE METABOLIC PANEL
ALT: 15 U/L (ref 0–44)
AST: 21 U/L (ref 15–41)
Albumin: 4.1 g/dL (ref 3.5–5.0)
Alkaline Phosphatase: 46 U/L (ref 38–126)
Anion gap: 7 (ref 5–15)
BUN: 20 mg/dL (ref 8–23)
CO2: 25 mmol/L (ref 22–32)
Calcium: 9.1 mg/dL (ref 8.9–10.3)
Chloride: 106 mmol/L (ref 98–111)
Creatinine, Ser: 1.15 mg/dL (ref 0.61–1.24)
GFR, Estimated: >60 mL/min (ref >60)
Glucose, Bld: 91 mg/dL (ref 70–99)
Potassium: 4.3 mmol/L (ref 3.5–5.1)
Sodium: 138 mmol/L (ref 135–145)
Total Bilirubin: 0.9 mg/dL (ref 0.3–1.2)
Total Protein: 6.7 g/dL (ref 6.5–8.1)

## 2021-10-08 LAB — T4, FREE: Free T4: 0.86 ng/dL (ref 0.61–1.12)

## 2021-10-08 LAB — TSH: TSH: 4.438 u[IU]/mL (ref 0.350–4.500)

## 2021-10-08 MED ORDER — AMIODARONE HCL 200 MG PO TABS: ORAL_TABLET | ORAL | 3 refills | Status: DC

## 2021-10-08 NOTE — Progress Notes (Signed)
?Electrophysiology Office Note:   ? ?Date:  10/08/2021  ? ?ID:  Cody Caraway Sr., DOB 1940-12-05, MRN [id number] ? ?PCP:  Cody Ruths, MD  ?[facility] Cardiologist:  None  ?[place] Electrophysiologist:  None  ? ?Referring MD: Cody Ruths, MD  ? ?Chief Complaint: PVCs ? ?History of Present Illness:   ? ?Cody Caraway Sr. is a 81 y.o. male who presents for an evaluation of PVCs at the request of Dr. Clayborn Pollard. Their medical history includes chronic systolic heart failure secondary to a nonischemic cardiomyopathy, paroxysmal atrial fibrillation not on anticoagulation, GERD, hypertension, hyperlipidemia.  The patient last saw Dr. Clayborn Pollard on September 01, 2021.  At that appointment a LifeVest was present.  He is referred to discuss ICD therapy. ? ?He is with his wife today in clinic.  He has been wearing his LifeVest.  He does not shocked him.  He has never had a syncopal episode.  He has never had ventricular fibrillation or tachycardia.  He tells me that the Wilder Glade was just added to his medication regimen.  He is tolerating it well without off target effects.  He works at a [employer] as a Tourist information centre manager. ? ? ?  ?Past Medical History:  ?Diagnosis Date  ? Arrhythmia   ? atrial fibrillation  ? BPH (benign prostatic hyperplasia)   ? CHF (congestive heart failure) ([place])   ? Complication of anesthesia   ? Edema   ? MILD FEET/ANKLES OCCAS  ? History of kidney stones   ? Hyperlipidemia   ? Hypertension   ? Neuromuscular disorder ([place])   ? TRIGEMINAL NEURALGIA  ? Palpitations   ? PONV (postoperative nausea and vomiting)   ? Sleep apnea   ? DOES NOT USE CPAP  ? Vertigo   ? ? ?Past Surgical History:  ?Procedure Laterality Date  ? CATARACT EXTRACTION W/PHACO Left 09/23/2017  ? Procedure: CATARACT EXTRACTION PHACO AND INTRAOCULAR LENS PLACEMENT (IOC);  Surgeon: Eulogio Bear, MD;  Location: [facility] ORS;  Service: Ophthalmology;  Laterality: Left;  [place] 00:19 ?AP% 8.5 ?CDE 1.64 ?Fluid pak lot #  [id number]  ? COLONOSCOPY WITH PROPOFOL N/A 05/06/2016  ? Procedure: COLONOSCOPY WITH PROPOFOL;  Surgeon: Manya Silvas, MD;  Location: [facility] ENDOSCOPY;  Service: Endoscopy;  Laterality: N/A;  ? HERNIA REPAIR    ? RIGHT/LEFT HEART CATH AND CORONARY ANGIOGRAPHY N/A 06/13/2021  ? Procedure: RIGHT/LEFT HEART CATH AND CORONARY ANGIOGRAPHY and possible PCI and stent;  Surgeon: Yolonda Kida, MD;  Location: [place] CV LAB;  Service: Cardiovascular;  Laterality: N/A;  ? ? ?Current Medications: ?Current Meds  ?Medication Sig  ? amiodarone (PACERONE) 200 MG tablet Take 1 tablet (200 mg total) by mouth 2 (two) times daily for 14 days, THEN 1 tablet (200 mg total) daily.  ? aspirin EC 81 MG tablet Take 81 mg by mouth daily.  ? carvedilol (COREG) 6.25 MG tablet Take 1 tablet (6.25 mg total) by mouth 2 (two) times daily with a meal.  ? dapagliflozin propanediol (FARXIGA) 10 MG TABS tablet Take 1 tablet (10 mg total) by mouth daily before breakfast.  ? dutasteride (AVODART) 0.5 MG capsule Take 0.5 mg by mouth daily.  ? furosemide (LASIX) 20 MG tablet Take 20 mg by mouth daily.  ? gabapentin (NEURONTIN) 300 MG capsule Take 300 mg by mouth 3 (three) times daily.  ? rosuvastatin (CRESTOR) 10 MG tablet Take 10 mg by mouth daily.  ? sacubitril-valsartan (ENTRESTO) 24-26 MG Take 1 tablet by mouth  2 (two) times daily.  ? spironolactone (ALDACTONE) 25 MG tablet Take 0.5 tablets (12.5 mg total) by mouth daily.  ?  ? ?Allergies:   Penicillins  ? ?Social History  ? ?Socioeconomic History  ? Marital status: Married  ?  Spouse name: Not on file  ? Number of children: Not on file  ? Years of education: Not on file  ? Highest education level: Not on file  ?Occupational History  ? Not on file  ?Tobacco Use  ? Smoking status: Never  ? Smokeless tobacco: Never  ?Vaping Use  ? Vaping Use: Never used  ?Substance and Sexual Activity  ? Alcohol use: No  ? Drug use: No  ? Sexual activity: Not on file  ?Other Topics Concern  ? Not on file   ?Social History Narrative  ? Not on file  ? ?Social Determinants of Health  ? ?Financial Resource Strain: Not on file  ?Food Insecurity: Not on file  ?Transportation Needs: Not on file  ?Physical Activity: Not on file  ?Stress: Not on file  ?Social Connections: Not on file  ?  ? ?Family History: ?The patient's family history is not on file. ? ?ROS:   ?Please see the history of present illness.    ?All other systems reviewed and are negative. ? ?EKGs/Labs/Other Studies Reviewed:   ? ?The following studies were reviewed today: ? ?June 13, 2021 heart catheterization ?  Prox LAD lesion is 50% stenosed. ?  There is severe left ventricular systolic dysfunction. ?  LV end diastolic pressure is mildly elevated. ?  The left ventricular ejection fraction is less than 25% by visual estimate. ?  No significant obstructive coronary disease ?  Normal right heart pressures ?  Dilated nonischemic cardiomyopathy ?  ? ?June 12, 2021 echo ?Severely reduced left ventricular function, 25% ?Low normal right ventricular function ?Mildly dilated left and right atrium ?Trivial MR ? ? ?June 12, 2021 EKG shows sinus rhythm with very frequent PVCs and nonsustained ventricular tachycardia.  PVCs are monomorphic.  They have a steeply inferior axis and are positive throughout the precordium. ? ?October 18, 2017 48-hour Holter monitor shows 23% PVC burden ? ?08/25/2021 echo ([place]) ?INTERPRETATION  ?MODERATE LV SYSTOLIC DYSFUNCTION (See above)   WITH MILD LVH  ?NORMAL RIGHT VENTRICULAR SYSTOLIC FUNCTION  ?MILD VALVULAR REGURGITATION (See above)  ?NO VALVULAR STENOSIS  ?MILD MR, TR, PR  ?TRIVIAL AR  ?EF 30%  ? ? ? ? ? ?EKG:  The ekg ordered today demonstrates sinus rhythm.  No PVCs on today's EKG. ? ? ?Recent Labs: ?06/12/2021: ALT 53; Hemoglobin 12.8; Magnesium 2.1; Platelets 187 ?06/14/2021: B Natriuretic Peptide 808.7; BUN 30; Creatinine, Ser 0.86; Potassium 3.5; Sodium 138  ?Recent Lipid Panel ?No results found for: CHOL, TRIG, HDL,  CHOLHDL, VLDL, LDLCALC, LDLDIRECT ? ?Physical Exam:   ? ?VS:  BP 122/62 (BP Location: Left Arm, Patient Position: Sitting, Cuff Size: Normal)   Pulse (!) 55   Ht '5\' 10"'$  (1.778 m)   Wt 212 lb (96.2 kg)   SpO2 97%   BMI 30.42 kg/m?    ? ?Wt Readings from Last 3 Encounters:  ?10/08/21 212 lb (96.2 kg)  ?10/06/21 215 lb 6 oz (97.7 kg)  ?08/05/21 207 lb 7 oz (94.1 kg)  ?  ? ?GEN:  Well nourished, well developed in no acute distress ?HEENT: Normal ?NECK: No JVD; No carotid bruits ?LYMPHATICS: No lymphadenopathy ?CARDIAC: RRR, no murmurs, rubs, gallops.  LifeVest in place ?RESPIRATORY:  Clear to auscultation without rales,  wheezing or rhonchi  ?ABDOMEN: Soft, non-tender, non-distended ?MUSCULOSKELETAL:  No edema; No deformity  ?SKIN: Warm and dry ?NEUROLOGIC:  Alert and oriented x 3 ?PSYCHIATRIC:  Normal affect  ? ? ?  ? ?ASSESSMENT:   ? ?1. Chronic systolic congestive heart failure ([place])   ?2. Paroxysmal atrial fibrillation ([place])   ?3. Frequent PVCs   ?4. OSA (obstructive sleep apnea)   ? ?PLAN:   ? ?In order of problems listed above: ? ? ?#Chronic systolic heart failure ?Chronically reduced ejection fraction.  Likely secondary to frequent PVCs.  He has some nonobstructive coronary disease but this is not enough to have caused his ejection fraction to be reduced.  On good medical therapy the EF is improved to 30%. ? ?NYHA class I-II.  Warm and dry on exam.  On spironolactone, Entresto, Lasix, Farxiga, Coreg. ? ?I had a long shared decision making discussion today with the patient his wife about his risk for sudden cardiac death.  The patient has never had sudden cardiac death or documented VT/VF.  While his EF is reduced, it is improved on good medical therapy and is now around 30% before Wilder Glade was added.  He does not have a history of coronary artery disease or regional scar on echo. ? ?We discussed how age contributes to risk for sudden cardiac death and potential for life-saving therapies from a defibrillator.   We discussed the risk for sudden cardiac death in patients with ischemic and nonischemic cardiomyopathy.  We discussed the indications for ICD therapy and how recommendations for ICD therapy change as we age.  [place]ft

## 2021-10-08 NOTE — Patient Instructions (Addendum)
Medications: ?Start Amiodarone 200 mg two times a daily for 14 days then 200 mg daily ?Your physician recommends that you continue on your current medications as directed. Please refer to the Current Medication list given to you today. ?*If you need a refill on your cardiac medications before your next appointment, please call your pharmacy* ? ?Lab Work: ?None. ?If you have labs (blood work) drawn today and your tests are completely normal, you will receive your results only by: ?MyChart Message (if you have MyChart) OR ?A paper copy in the mail ?If you have any lab test that is abnormal or we need to change your treatment, we will call you to review the results. ? ?Testing/Procedures: ?CMP, TSH, Free T4 ? ?Follow-Up: ?At [facility], you and your health needs are our priority.  As part of our continuing mission to provide you with exceptional heart care, we have created designated Provider Care Teams.  These Care Teams include your primary Cardiologist (physician) and Advanced Practice Providers (APPs -  Physician Assistants and Nurse Practitioners) who all work together to provide you with the care you need, when you need it. ? ?Your physician wants you to follow-up in: 3 months with Dr. Lars Mage  ? ?We recommend signing up for the patient portal called "MyChart".  Sign up information is provided on this After Visit Summary.  MyChart is used to connect with patients for Virtual Visits (Telemedicine).  Patients are able to view lab/test results, encounter notes, upcoming appointments, etc.  Non-urgent messages can be sent to your provider as well.   ?To learn more about what you can do with MyChart, go to [email or web address].   ? ?Any Other Special Instructions Will Be Listed Below (If Applicable). ? ?Return [place] ? ?

## 2021-10-08 NOTE — Telephone Encounter (Signed)
Sent off patient assistance applications for both farxiga and Entresto today 10/08/2021 with proof of income to be considered and notified patient. ? ? ?Deloma Spindle, NT ?

## 2021-10-09 ENCOUNTER — Telehealth: Payer: Self-pay | Admitting: Family

## 2021-10-09 NOTE — Telephone Encounter (Signed)
Notified patient that he has been approved for both entresto and farxiga patient assistance until the end of the year 2023 and left voicemail instructions on getting the medications shipped to him.  ? ? ?Cody Pollard, NT ? ?

## 2021-10-20 ENCOUNTER — Telehealth: Payer: Self-pay | Admitting: Family

## 2021-10-20 NOTE — Telephone Encounter (Signed)
Called and notified patient that his medication for farxiga is approved for patient assistance and has been shipped. ? ? ? ?Fredric Slabach, NT ?

## 2021-10-25 DIAGNOSIS — J40 Bronchitis, not specified as acute or chronic: Secondary | ICD-10-CM | POA: Diagnosis not present

## 2021-10-25 DIAGNOSIS — J019 Acute sinusitis, unspecified: Secondary | ICD-10-CM | POA: Diagnosis not present

## 2021-10-25 DIAGNOSIS — I5022 Chronic systolic (congestive) heart failure: Secondary | ICD-10-CM | POA: Diagnosis not present

## 2021-10-25 DIAGNOSIS — Z03818 Encounter for observation for suspected exposure to other biological agents ruled out: Secondary | ICD-10-CM | POA: Diagnosis not present

## 2021-10-25 DIAGNOSIS — B9689 Other specified bacterial agents as the cause of diseases classified elsewhere: Secondary | ICD-10-CM | POA: Diagnosis not present

## 2021-11-06 ENCOUNTER — Ambulatory Visit: Payer: PPO | Admitting: Family

## 2021-11-12 ENCOUNTER — Other Ambulatory Visit: Payer: Self-pay | Admitting: Family

## 2021-11-12 ENCOUNTER — Ambulatory Visit
Admission: RE | Admit: 2021-11-12 | Discharge: 2021-11-12 | Disposition: A | Discharge status: Home / Self Care | Payer: PPO | Source: Ambulatory Visit | Attending: Family | Admitting: Family

## 2021-11-12 DIAGNOSIS — E7800 Pure hypercholesterolemia, unspecified: Secondary | ICD-10-CM | POA: Diagnosis not present

## 2021-11-12 DIAGNOSIS — Z01812 Encounter for preprocedural laboratory examination: Secondary | ICD-10-CM | POA: Diagnosis not present

## 2021-11-12 DIAGNOSIS — R7303 Prediabetes: Secondary | ICD-10-CM | POA: Diagnosis not present

## 2021-11-12 DIAGNOSIS — I5023 Acute on chronic systolic (congestive) heart failure: Secondary | ICD-10-CM | POA: Insufficient documentation

## 2021-11-12 DIAGNOSIS — I10 Essential (primary) hypertension: Secondary | ICD-10-CM | POA: Diagnosis not present

## 2021-11-12 LAB — BASIC METABOLIC PANEL
Anion gap: 7 (ref 5–15)
BUN: 22 mg/dL (ref 8–23)
CO2: 22 mmol/L (ref 22–32)
Calcium: 8.8 mg/dL — ABNORMAL LOW (ref 8.9–10.3)
Chloride: 110 mmol/L (ref 98–111)
Creatinine, Ser: 1.11 mg/dL (ref 0.61–1.24)
GFR, Estimated: >60 mL/min (ref >60)
Glucose, Bld: 124 mg/dL — ABNORMAL HIGH (ref 70–99)
Potassium: 4.1 mmol/L (ref 3.5–5.1)
Sodium: 139 mmol/L (ref 135–145)

## 2021-11-12 LAB — BRAIN NATRIURETIC PEPTIDE: B Natriuretic Peptide: 459.9 pg/mL — ABNORMAL HIGH (ref 0.0–100.0)

## 2021-11-12 MED ORDER — POTASSIUM CHLORIDE CRYS ER 20 MEQ PO TBCR
EXTENDED_RELEASE_TABLET | ORAL | Status: AC
  Administered 2021-11-12: 40 meq via ORAL
  Filled 2021-11-12: qty 2

## 2021-11-12 MED ORDER — SODIUM CHLORIDE FLUSH 0.9 % IV SOLN
INTRAVENOUS | Status: AC
  Administered 2021-11-12: 5 mL
  Filled 2021-11-12: qty 10

## 2021-11-12 MED ORDER — FUROSEMIDE 10 MG/ML IJ SOLN: 80.0000 mg | Freq: Once | INTRAMUSCULAR | Status: AC

## 2021-11-12 MED ORDER — FUROSEMIDE 10 MG/ML IJ SOLN
INTRAMUSCULAR | Status: AC
  Administered 2021-11-12: 80 mg via INTRAVENOUS
  Filled 2021-11-12: qty 8

## 2021-11-12 MED ORDER — POTASSIUM CHLORIDE CRYS ER 20 MEQ PO TBCR: 40.0000 meq | EXTENDED_RELEASE_TABLET | Freq: Once | ORAL | Status: AC

## 2021-11-12 NOTE — Progress Notes (Signed)
IV lasix/ oral potassium ordered for today due to weight gain and worsening symptoms. Will assess patient in clinic tomorrow

## 2021-11-13 ENCOUNTER — Ambulatory Visit: Payer: PPO | Attending: Family | Admitting: Family

## 2021-11-13 ENCOUNTER — Encounter: Payer: Self-pay | Admitting: Family

## 2021-11-13 VITALS — BP 99/48 | HR 46 | Resp 16 | Ht 68.0 in | Wt 206.0 lb

## 2021-11-13 DIAGNOSIS — I110 Hypertensive heart disease with heart failure: Secondary | ICD-10-CM | POA: Diagnosis not present

## 2021-11-13 DIAGNOSIS — I5022 Chronic systolic (congestive) heart failure: Secondary | ICD-10-CM | POA: Insufficient documentation

## 2021-11-13 DIAGNOSIS — I480 Paroxysmal atrial fibrillation: Secondary | ICD-10-CM | POA: Insufficient documentation

## 2021-11-13 DIAGNOSIS — I10 Essential (primary) hypertension: Secondary | ICD-10-CM | POA: Diagnosis not present

## 2021-11-13 DIAGNOSIS — G500 Trigeminal neuralgia: Secondary | ICD-10-CM | POA: Insufficient documentation

## 2021-11-13 DIAGNOSIS — E785 Hyperlipidemia, unspecified: Secondary | ICD-10-CM | POA: Diagnosis not present

## 2021-11-13 DIAGNOSIS — G4730 Sleep apnea, unspecified: Secondary | ICD-10-CM | POA: Insufficient documentation

## 2021-11-13 DIAGNOSIS — G4733 Obstructive sleep apnea (adult) (pediatric): Secondary | ICD-10-CM | POA: Diagnosis not present

## 2021-11-13 DIAGNOSIS — N400 Enlarged prostate without lower urinary tract symptoms: Secondary | ICD-10-CM | POA: Diagnosis not present

## 2021-11-13 DIAGNOSIS — Z87442 Personal history of urinary calculi: Secondary | ICD-10-CM | POA: Insufficient documentation

## 2021-11-13 NOTE — Patient Instructions (Addendum)
Continue weighing daily and call for an overnight weight gain of 3 pounds or more or a weekly weight gain of more than 5 pounds.  If you have voicemail, please make sure your mailbox is cleaned out so that we may leave a message and please make sure to listen to any voicemails.    Decrease carvedilol to 1/2 tablet twice a day

## 2021-11-13 NOTE — Progress Notes (Signed)
Patient ID: Cody Caraway Sr., male    DOB: [birth year], 81 y.o.   MRN: [id number]   Cody Pollard is a 81 y/o male with a history of atrial fibrillation, hyperlipidemia, HTN, BPH, kidney stones, trigeminal neuralgia, sleep apnea and chronic heart failure.   Echo report from 08/25/21 reviewed and showed an EF of 30%. Echo report from 06/12/21 reviewed and showed an EF of 25-30%  RHC/LHC done 06/13/21 showed: Prox LAD lesion is 50% stenosed.   There is severe left ventricular systolic dysfunction.   LV end diastolic pressure is mildly elevated.   The left ventricular ejection fraction is less than 25% by visual estimate.   No significant obstructive coronary disease   Normal right heart pressures   Dilated nonischemic cardiomyopathy  Admitted 06/12/21 due to shortness of breath due to heart failure. Initially given IV lasix with transition to oral diuretics. Cardiology consult obtained. Medications adjusted and lifevest applied. Discharged the following day.   He presents today for a follow up visit with a chief complaint of minimal fatigue upon moderate exertion. Describes this as chronic although feels like it has worsened over the last several weeks. He has associated palpitations and dizziness along with this. He denies any difficulty sleeping, abdominal distention, pedal edema, chest pain, shortness of breath, cough or weight gain.   Received '80mg'$  IV lasix/52mq PO potassium yesterday and feels like his swelling improved with resultant weight loss. Just had labs drawn today for his upcoming PCP visit next week.   Past Medical History:  Diagnosis Date   Arrhythmia    atrial fibrillation   BPH (benign prostatic hyperplasia)    CHF (congestive heart failure) (H[facility])    Complication of anesthesia    Edema    MILD FEET/ANKLES OCCAS   History of kidney stones    Hyperlipidemia    Hypertension    Neuromuscular disorder ([facility])    TRIGEMINAL NEURALGIA   Palpitations    PONV  (postoperative nausea and vomiting)    Sleep apnea    DOES NOT USE CPAP   Vertigo    Past Surgical History:  Procedure Laterality Date   CATARACT EXTRACTION W/PHACO Left 09/23/2017   Procedure: CATARACT EXTRACTION PHACO AND INTRAOCULAR LENS PLACEMENT ([place];  Surgeon: KEulogio Bear MD;  Location: [facility] ORS;  Service: Ophthalmology;  Laterality: Left;  [place]:19 AP% 8.5 CDE 1.64 Fluid pak lot # [id number]   COLONOSCOPY WITH PROPOFOL N/A 05/06/2016   Procedure: COLONOSCOPY WITH PROPOFOL;  Surgeon: RManya Silvas MD;  [facility];  Service: Endoscopy;  Laterality: N/A;   HERNIA REPAIR     RIGHT/LEFT HEART CATH AND CORONARY ANGIOGRAPHY N/A 06/13/2021   Procedure: RIGHT/LEFT HEART CATH AND CORONARY ANGIOGRAPHY and possible PCI and stent;  Surgeon: CYolonda Kida MD;  [facility] [place];  Service: Cardiovascular;  Laterality: N/A;   No family history on file. Social History   Tobacco Use   Smoking status: Never   Smokeless tobacco: Never  Substance Use Topics   Alcohol use: No   Allergies  Allergen Reactions   Penicillins    Prior to Admission medications   Medication Sig Start Date End Date Taking? Authorizing Provider  amiodarone (PACERONE) 200 MG tablet Take 1 tablet (200 mg total) by mouth 2 (two) times daily for 14 days, THEN 1 tablet (200 mg total) daily. 10/08/21 10/17/22 Yes LVickie Epley MD  aspirin EC 81 MG tablet Take 81 mg by mouth daily.   Yes [provider]  carvedilol (COREG) 6.25 MG tablet Take 1 tablet (6.25 mg total) by mouth 2 (two) times daily with a meal. Patient taking differently: Take 3.125 mg by mouth 2 (two) times daily with a meal. 06/14/21  Yes Wieting, Richard, MD  dapagliflozin propanediol (FARXIGA) 10 MG TABS tablet Take 1 tablet (10 mg total) by mouth daily before breakfast. 10/06/21  Yes Darylene Price A, FNP  dutasteride (AVODART) 0.5 MG capsule Take 0.5 mg by mouth daily. 12/24/13  Yes [provider]  furosemide (LASIX) 20 MG tablet Take 20 mg by mouth daily.   Yes [provider]  gabapentin (NEURONTIN) 300 MG capsule Take 300 mg by mouth 3 (three) times daily. 04/04/21  Yes [provider]  rosuvastatin (CRESTOR) 10 MG tablet Take 10 mg by mouth daily.   Yes [provider]  sacubitril-valsartan (ENTRESTO) 24-26 MG Take 1 tablet by mouth 2 (two) times daily. 10/06/21  Yes Darylene Price A, FNP  spironolactone (ALDACTONE) 25 MG tablet Take 0.5 tablets (12.5 mg total) by mouth daily. 06/15/21  Yes Loletha Grayer, MD   Review of Systems  Constitutional:  Positive for fatigue. Negative for appetite change.  HENT:  Negative for congestion, postnasal drip and sore throat.   Eyes: Negative.   Respiratory:  Negative for cough, chest tightness and shortness of breath.   Cardiovascular:  Positive for palpitations. Negative for chest pain and leg swelling.  Gastrointestinal:  Negative for abdominal distention and abdominal pain.  Endocrine: Negative.   Genitourinary: Negative.   Musculoskeletal:  Negative for back pain and neck pain.  Skin: Negative.   Allergic/Immunologic: Negative.   Neurological:  Positive for dizziness and light-headedness. Negative for headaches.  Hematological:  Negative for adenopathy. Does not bruise/bleed easily.  Psychiatric/Behavioral:  Negative for dysphoric mood and sleep disturbance (wearing CPAP). The patient is not nervous/anxious.    Vitals:   11/13/21 1353  BP: (!) 99/48  Pulse: (!) 46  Resp: 16  SpO2: 97%  Weight: 206 lb (93.4 kg)  Height: '5\' 8"'$  (1.727 m)   Wt Readings from Last 3 Encounters:  11/13/21 206 lb (93.4 kg)  10/08/21 212 lb (96.2 kg)  10/06/21 215 lb 6 oz (97.7 kg)   Lab Results  Component Value Date   CREATININE 1.11 11/12/2021   CREATININE 1.15 10/08/2021   CREATININE 0.86 06/14/2021   Physical Exam Vitals and nursing note reviewed.  Constitutional:      Appearance: Normal appearance.  HENT:      Head: Normocephalic and atraumatic.  Cardiovascular:     Rate and Rhythm: Regular rhythm. Bradycardia present.  Pulmonary:     Effort: Pulmonary effort is normal. No respiratory distress.     Breath sounds: No wheezing or rales.  Abdominal:     General: There is no distension.     Palpations: Abdomen is soft.  Musculoskeletal:        General: No tenderness.     Cervical back: Normal range of motion and neck supple.     Right lower leg: No edema.     Left lower leg: No edema.  Skin:    General: Skin is warm and dry.  Neurological:     General: No focal deficit present.     Mental Status: He is alert and oriented to person, place, and time.  Psychiatric:        Mood and Affect: Mood normal.        Behavior: Behavior normal.  Thought Content: Thought content normal.    Assessment & Plan:  1: Chronic heart failure with reduced ejection fraction- - NYHA class II - euvolemic today - weighing daily; reminded to call for an overnight weight gain of > 2 pounds or a weekly weight gain of > 5 pounds - weight down 9 pounds from last visit here 5 weeks ago - not adding salt and has been looking at food labels - saw cardiology Clayborn Bigness) 09/01/21 - on GDMT of carvedilol, entresto, farxiga & spironolactone - IV lasix given yesterday with improvement of symptoms; patient says he had lab work done earlier today for upcoming PCP visit next week so will not repeat them - adhering to 64 ounces/day of fluid - BNP 11/12/21 was 459.9  2: HTN- - BP soft today (99/48) - saw PCP Ouida Sills) 06/19/21 - BMP 11/12/21 reviewed and showed sodium 139, potassium 4.1, creatinine 1.11 and GFR >60  3: PAF-  - bradycardic today and he says that his home HR gets in the 40's "often" - will decrease his carvedilol to 3.'12mg'$  BID; he can break his current tablet in half and take 1/2 tablet BID - saw EP Quentin Ore) 10/08/21  4: Sleep apnea- - wearing CPAP 5-6 hours/ night  - says that he feels like he has  adjusted to the equipment and is sleeping well   Medication bottles reviewed.   Return in 1 month, sooner if needed.

## 2021-11-14 ENCOUNTER — Encounter: Payer: Self-pay | Admitting: Family

## 2021-11-19 DIAGNOSIS — I10 Essential (primary) hypertension: Secondary | ICD-10-CM | POA: Diagnosis not present

## 2021-11-19 DIAGNOSIS — G4733 Obstructive sleep apnea (adult) (pediatric): Secondary | ICD-10-CM | POA: Diagnosis not present

## 2021-11-19 DIAGNOSIS — I5022 Chronic systolic (congestive) heart failure: Secondary | ICD-10-CM | POA: Diagnosis not present

## 2021-11-19 DIAGNOSIS — E7800 Pure hypercholesterolemia, unspecified: Secondary | ICD-10-CM | POA: Diagnosis not present

## 2021-11-19 DIAGNOSIS — N63 Unspecified lump in unspecified breast: Secondary | ICD-10-CM | POA: Diagnosis not present

## 2021-11-19 DIAGNOSIS — R7303 Prediabetes: Secondary | ICD-10-CM | POA: Diagnosis not present

## 2021-11-21 ENCOUNTER — Other Ambulatory Visit: Payer: Self-pay | Admitting: Internal Medicine

## 2021-11-21 DIAGNOSIS — Z1231 Encounter for screening mammogram for malignant neoplasm of breast: Secondary | ICD-10-CM

## 2021-11-21 DIAGNOSIS — N6320 Unspecified lump in the left breast, unspecified quadrant: Secondary | ICD-10-CM

## 2021-12-10 ENCOUNTER — Ambulatory Visit
Admission: RE | Admit: 2021-12-10 | Discharge: 2021-12-10 | Disposition: A | Discharge status: Home / Self Care | Payer: PPO | Source: Ambulatory Visit | Attending: Internal Medicine | Admitting: Internal Medicine

## 2021-12-10 ENCOUNTER — Other Ambulatory Visit: Payer: PPO

## 2021-12-10 DIAGNOSIS — N6342 Unspecified lump in left breast, subareolar: Secondary | ICD-10-CM | POA: Insufficient documentation

## 2021-12-10 DIAGNOSIS — G4733 Obstructive sleep apnea (adult) (pediatric): Secondary | ICD-10-CM | POA: Diagnosis not present

## 2021-12-10 DIAGNOSIS — N62 Hypertrophy of breast: Secondary | ICD-10-CM | POA: Insufficient documentation

## 2021-12-10 DIAGNOSIS — N6320 Unspecified lump in the left breast, unspecified quadrant: Secondary | ICD-10-CM

## 2021-12-10 DIAGNOSIS — Z1231 Encounter for screening mammogram for malignant neoplasm of breast: Secondary | ICD-10-CM | POA: Diagnosis not present

## 2021-12-10 DIAGNOSIS — Z79899 Other long term (current) drug therapy: Secondary | ICD-10-CM | POA: Insufficient documentation

## 2021-12-15 ENCOUNTER — Telehealth: Payer: Self-pay

## 2021-12-19 ENCOUNTER — Ambulatory Visit: Payer: PPO | Attending: Family | Admitting: Family

## 2021-12-19 ENCOUNTER — Encounter: Payer: Self-pay | Admitting: Family

## 2021-12-19 VITALS — BP 126/55 | HR 48 | Resp 18 | Ht 71.0 in | Wt 207.0 lb

## 2021-12-19 DIAGNOSIS — I420 Dilated cardiomyopathy: Secondary | ICD-10-CM | POA: Diagnosis not present

## 2021-12-19 DIAGNOSIS — Z87442 Personal history of urinary calculi: Secondary | ICD-10-CM | POA: Insufficient documentation

## 2021-12-19 DIAGNOSIS — G500 Trigeminal neuralgia: Secondary | ICD-10-CM | POA: Insufficient documentation

## 2021-12-19 DIAGNOSIS — I10 Essential (primary) hypertension: Secondary | ICD-10-CM

## 2021-12-19 DIAGNOSIS — E785 Hyperlipidemia, unspecified: Secondary | ICD-10-CM | POA: Insufficient documentation

## 2021-12-19 DIAGNOSIS — I110 Hypertensive heart disease with heart failure: Secondary | ICD-10-CM | POA: Insufficient documentation

## 2021-12-19 DIAGNOSIS — Z7984 Long term (current) use of oral hypoglycemic drugs: Secondary | ICD-10-CM | POA: Insufficient documentation

## 2021-12-19 DIAGNOSIS — Z79899 Other long term (current) drug therapy: Secondary | ICD-10-CM | POA: Diagnosis not present

## 2021-12-19 DIAGNOSIS — I5022 Chronic systolic (congestive) heart failure: Secondary | ICD-10-CM | POA: Insufficient documentation

## 2021-12-19 DIAGNOSIS — Z880 Allergy status to penicillin: Secondary | ICD-10-CM | POA: Insufficient documentation

## 2021-12-19 DIAGNOSIS — N400 Enlarged prostate without lower urinary tract symptoms: Secondary | ICD-10-CM | POA: Diagnosis not present

## 2021-12-19 DIAGNOSIS — G4730 Sleep apnea, unspecified: Secondary | ICD-10-CM | POA: Diagnosis not present

## 2021-12-19 DIAGNOSIS — Z7982 Long term (current) use of aspirin: Secondary | ICD-10-CM | POA: Diagnosis not present

## 2021-12-19 DIAGNOSIS — I4891 Unspecified atrial fibrillation: Secondary | ICD-10-CM | POA: Diagnosis not present

## 2021-12-19 DIAGNOSIS — G4733 Obstructive sleep apnea (adult) (pediatric): Secondary | ICD-10-CM

## 2021-12-19 DIAGNOSIS — I480 Paroxysmal atrial fibrillation: Secondary | ICD-10-CM | POA: Diagnosis not present

## 2021-12-19 NOTE — Progress Notes (Unsigned)
Patient ID: [name], male    DOB: 28-Oct-1940, 81 y.o.   MRN: [id number]   Mr Warshaw is a 81 y/o male with a history of atrial fibrillation, hyperlipidemia, HTN, BPH, kidney stones, trigeminal neuralgia, sleep apnea and chronic heart failure.   Echo report from 08/25/21 reviewed and showed an EF of 30%. Echo report from 06/12/21 reviewed and showed an EF of 25-30%  RHC/LHC done 06/13/21 showed: Prox LAD lesion is 50% stenosed.   There is severe left ventricular systolic dysfunction.   LV end diastolic pressure is mildly elevated.   The left ventricular ejection fraction is less than 25% by visual estimate.   No significant obstructive coronary disease   Normal right heart pressures   Dilated nonischemic cardiomyopathy  Admitted 06/12/21 due to shortness of breath due to heart failure. Initially given IV lasix with transition to oral diuretics. Cardiology consult obtained. Medications adjusted and lifevest applied. Discharged the following day.   He presents today for a follow up visit with a chief complaint of minimal fatigue upon moderate exertion. Describes this as chronic in nature. He has associated palpitations & dizziness along with this. He denies any difficulty sleeping, abdominal distention, pedal edema, chest pain, shortness of breath, cough or weight gain.   He does notice that his trigeminal nerve pain has flared on the right side of his face. He does have gabapentin that he takes.   Asks question about rehab referral.   Past Medical History:  Diagnosis Date   Arrhythmia    atrial fibrillation   BPH (benign prostatic hyperplasia)    CHF (congestive heart failure) (HCC)    Complication of anesthesia    Edema    MILD FEET/ANKLES OCCAS   History of kidney stones    Hyperlipidemia    Hypertension    Neuromuscular disorder (HCC)    TRIGEMINAL NEURALGIA   Palpitations    PONV (postoperative nausea and vomiting)    Sleep apnea    DOES NOT USE CPAP    Vertigo    Past Surgical History:  Procedure Laterality Date   CATARACT EXTRACTION W/PHACO Left 09/23/2017   Procedure: CATARACT EXTRACTION PHACO AND INTRAOCULAR LENS PLACEMENT ([place]);  Surgeon: Eulogio Bear, MD;  Location: [facility] ORS;  Service: Ophthalmology;  Laterality: Left;  [place] 00:19 AP% 8.5 CDE 1.64 Fluid pak lot # [id number]   COLONOSCOPY WITH PROPOFOL N/A 05/06/2016   Procedure: COLONOSCOPY WITH PROPOFOL;  Surgeon: Manya Silvas, MD;  Location: [facility] ENDOSCOPY;  Service: Endoscopy;  Laterality: N/A;   HERNIA REPAIR     RIGHT/LEFT HEART CATH AND CORONARY ANGIOGRAPHY N/A 06/13/2021   Procedure: RIGHT/LEFT HEART CATH AND CORONARY ANGIOGRAPHY and possible PCI and stent;  Surgeon: Yolonda Kida, MD;  Location: [place] CV LAB;  Service: Cardiovascular;  Laterality: N/A;   No family history on file. Social History   Tobacco Use   Smoking status: Never   Smokeless tobacco: Never  Substance Use Topics   Alcohol use: No   Allergies  Allergen Reactions   Penicillins    Prior to Admission medications   Medication Sig Start Date End Date Taking? Authorizing Provider  amiodarone (PACERONE) 200 MG tablet Take 1 tablet (200 mg total) by mouth 2 (two) times daily for 14 days, THEN 1 tablet (200 mg total) daily. 10/08/21 10/17/22 Yes Vickie Epley, MD  aspirin EC 81 MG tablet Take 81 mg by mouth daily.   Yes [provider]  carvedilol (COREG) 6.25 MG tablet  Take 1 tablet (6.25 mg total) by mouth 2 (two) times daily with a meal. Patient taking differently: Take 3.125 mg by mouth 2 (two) times daily with a meal. 06/14/21  Yes Wieting, Richard, MD  dapagliflozin propanediol (FARXIGA) 10 MG TABS tablet Take 1 tablet (10 mg total) by mouth daily before breakfast. 10/06/21  Yes Darylene Price A, FNP  dutasteride (AVODART) 0.5 MG capsule Take 0.5 mg by mouth daily. 12/24/13  Yes [provider]  furosemide (LASIX) 20 MG tablet Take 20 mg by mouth daily.   Yes  [provider]  gabapentin (NEURONTIN) 300 MG capsule Take 300 mg by mouth 3 (three) times daily. 04/04/21  Yes [provider]  rosuvastatin (CRESTOR) 10 MG tablet Take 10 mg by mouth daily.   Yes [provider]  sacubitril-valsartan (ENTRESTO) 24-26 MG Take 1 tablet by mouth 2 (two) times daily. 10/06/21  Yes Darylene Price A, FNP  spironolactone (ALDACTONE) 25 MG tablet Take 0.5 tablets (12.5 mg total) by mouth daily. 06/15/21  Yes Loletha Grayer, MD   Review of Systems  Constitutional:  Positive for fatigue. Negative for appetite change.  HENT:  Negative for congestion, postnasal drip and sore throat.   Eyes: Negative.   Respiratory:  Negative for cough, chest tightness and shortness of breath.   Cardiovascular:  Positive for palpitations. Negative for chest pain and leg swelling.  Gastrointestinal:  Negative for abdominal distention and abdominal pain.  Endocrine: Negative.   Genitourinary: Negative.   Musculoskeletal:  Negative for back pain and neck pain.  Skin: Negative.   Allergic/Immunologic: Negative.   Neurological:  Positive for dizziness and light-headedness. Negative for headaches.  Hematological:  Negative for adenopathy. Does not bruise/bleed easily.  Psychiatric/Behavioral:  Negative for dysphoric mood and sleep disturbance (wearing CPAP). The patient is not nervous/anxious.    Vitals:   12/19/21 1329  BP: (!) 126/55  Pulse: (!) 48  Resp: 18  SpO2: 98%  Weight: 207 lb (93.9 kg)  Height: '5\' 11"'$  (1.803 m)   Wt Readings from Last 3 Encounters:  12/19/21 207 lb (93.9 kg)  11/13/21 206 lb (93.4 kg)  10/08/21 212 lb (96.2 kg)   Lab Results  Component Value Date   CREATININE 1.11 11/12/2021   CREATININE 1.15 10/08/2021   CREATININE 0.86 06/14/2021   Physical Exam Vitals and nursing note reviewed.  Constitutional:      Appearance: Normal appearance.  HENT:     Head: Normocephalic and atraumatic.  Cardiovascular:     Rate and  Rhythm: Regular rhythm. Bradycardia present.  Pulmonary:     Effort: Pulmonary effort is normal. No respiratory distress.     Breath sounds: No wheezing or rales.  Abdominal:     General: There is no distension.     Palpations: Abdomen is soft.  Musculoskeletal:        General: No tenderness.     Cervical back: Normal range of motion and neck supple.     Right lower leg: No edema.     Left lower leg: No edema.  Skin:    General: Skin is warm and dry.  Neurological:     General: No focal deficit present.     Mental Status: He is alert and oriented to person, place, and time.  Psychiatric:        Mood and Affect: Mood normal.        Behavior: Behavior normal.        Thought Content: Thought content normal.  Assessment & Plan:  1: Chronic heart failure with reduced ejection fraction- - NYHA class II - euvolemic today - weighing daily; reminded to call for an overnight weight gain of > 2 pounds or a weekly weight gain of > 5 pounds - weight up 1 pound from last visit here 5 weeks ago - not adding salt and has been looking at food labels - saw cardiology Clayborn Bigness) 09/01/21 - on GDMT of carvedilol, entresto, farxiga & spironolactone - adhering to 64 ounces/day of fluid - discussed ordering echo later this year - cardiac rehab referral placed - BNP 11/12/21 was 459.9  2: HTN- - BP looks good (126/55) - saw PCP Ouida Sills) 11/19/21 - BMP 11/12/21 reviewed and showed sodium 139, potassium 4.1, creatinine 1.11 and GFR >60  3: PAF-  - bradycardic today and he says that his home HR gets in the 40's "often" - saw EP Quentin Ore) 10/08/21  4: Sleep apnea- - wearing CPAP 5-6 hours/ night  - says that he feels like he has adjusted to the equipment and is sleeping well   Medication list reviewed.   Return in October, sooner if needed.

## 2021-12-19 NOTE — Patient Instructions (Addendum)
Continue weighing daily and call for an overnight weight gain of 3 pounds or more or a weekly weight gain of more than 5 pounds.   If you have voicemail, please make sure your mailbox is cleaned out so that we may leave a message and please make sure to listen to any voicemails.    If you receive a satisfaction survey regarding the [facility], please take the time to fill it out. This way we can continue to provide excellent care and make any changes that need to be made.     Cardiac rehab referral placed and they will call you

## 2021-12-20 ENCOUNTER — Encounter: Payer: Self-pay | Admitting: Family

## 2021-12-25 DIAGNOSIS — G4733 Obstructive sleep apnea (adult) (pediatric): Secondary | ICD-10-CM | POA: Diagnosis not present

## 2022-01-07 ENCOUNTER — Ambulatory Visit: Payer: PPO | Admitting: Cardiology

## 2022-01-13 NOTE — Progress Notes (Unsigned)
Electrophysiology Office Follow up Visit Note:    Date:  01/14/2022   ID:  Cody Caraway Sr., DOB Jun 06, 1941, MRN [id number]  PCP:  Kirk Ruths, MD  [place] Cardiologist:  None  [facility] Electrophysiologist:  Vickie Epley, MD    Interval History:    Cody Caraway Sr. is a 81 y.o. male who presents for a follow up visit. They were last seen in clinic October 08, 2021 for PVCs and nonischemic cardiomyopathy.  When he presented to my clinic he was wearing a LifeVest.  After long discussion with the patient and his family, we opted to discontinue LifeVest and pursue aggressive medical therapy for his cardiomyopathy.  We plan to touch base today in clinic.  We also plan to order a ZIO monitor today to reassess his PVC burden on amiodarone.  If the PVC burden is reduced, planned to recheck the left ventricular function. Today he tells me he is doing overall well.  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  No syncope or presyncope.  Sometimes he does feel lightheaded when on his feet for prolonged period of time.    Past Medical History:  Diagnosis Date   Arrhythmia    atrial fibrillation   BPH (benign prostatic hyperplasia)    CHF (congestive heart failure) (HCC)    Complication of anesthesia    Edema    MILD FEET/ANKLES OCCAS   History of kidney stones    Hyperlipidemia    Hypertension    Neuromuscular disorder (HCC)    TRIGEMINAL NEURALGIA   Palpitations    PONV (postoperative nausea and vomiting)    Sleep apnea    DOES NOT USE CPAP   Vertigo     Past Surgical History:  Procedure Laterality Date   CATARACT EXTRACTION W/PHACO Left 09/23/2017   Procedure: CATARACT EXTRACTION PHACO AND INTRAOCULAR LENS PLACEMENT ([place]);  Surgeon: Eulogio Bear, MD;  Location: [facility] ORS;  Service: Ophthalmology;  Laterality: Left;  [place] 00:19 AP% 8.5 CDE 1.64 Fluid pak lot # [id number]   COLONOSCOPY WITH PROPOFOL N/A 05/06/2016   Procedure:  COLONOSCOPY WITH PROPOFOL;  Surgeon: Manya Silvas, MD;  Location: [facility] ENDOSCOPY;  Service: Endoscopy;  Laterality: N/A;   HERNIA REPAIR     RIGHT/LEFT HEART CATH AND CORONARY ANGIOGRAPHY N/A 06/13/2021   Procedure: RIGHT/LEFT HEART CATH AND CORONARY ANGIOGRAPHY and possible PCI and stent;  Surgeon: Yolonda Kida, MD;  Location: [place] CV LAB;  Service: Cardiovascular;  Laterality: N/A;    Current Medications: Current Meds  Medication Sig   amiodarone (PACERONE) 200 MG tablet Take 1 tablet (200 mg total) by mouth 2 (two) times daily for 14 days, THEN 1 tablet (200 mg total) daily.   aspirin EC 81 MG tablet Take 81 mg by mouth daily.   carvedilol (COREG) 6.25 MG tablet Take 1 tablet (6.25 mg total) by mouth 2 (two) times daily with a meal. (Patient taking differently: Take 3.125 mg by mouth 2 (two) times daily with a meal.)   dapagliflozin propanediol (FARXIGA) 10 MG TABS tablet Take 1 tablet (10 mg total) by mouth daily before breakfast.   dutasteride (AVODART) 0.5 MG capsule Take 0.5 mg by mouth daily.   furosemide (LASIX) 20 MG tablet Take 20 mg by mouth daily.   gabapentin (NEURONTIN) 300 MG capsule Take 300 mg by mouth 3 (three) times daily.   rosuvastatin (CRESTOR) 10 MG tablet Take 10 mg by mouth daily.  sacubitril-valsartan (ENTRESTO) 24-26 MG Take 1 tablet by mouth 2 (two) times daily.   spironolactone (ALDACTONE) 25 MG tablet Take 0.5 tablets (12.5 mg total) by mouth daily.     Allergies:   Penicillins   Social History   Socioeconomic History   Marital status: Married    Spouse name: Not on file   Number of children: Not on file   Years of education: Not on file   Highest education level: Not on file  Occupational History   Not on file  Tobacco Use   Smoking status: Never   Smokeless tobacco: Never  Vaping Use   Vaping Use: Never used  Substance and Sexual Activity   Alcohol use: No   Drug use: No   Sexual activity: Not on file  Other Topics Concern    Not on file  Social History Narrative   Not on file   Social Determinants of Health   Financial Resource Strain: Not on file  Food Insecurity: Not on file  Transportation Needs: Not on file  Physical Activity: Not on file  Stress: Not on file  Social Connections: Not on file     Family History: The patient's family history is not on file.  ROS:   Please see the history of present illness.    All other systems reviewed and are negative.  EKGs/Labs/Other Studies Reviewed:    The following studies were reviewed today:    EKG:  The ekg ordered today demonstrates sinus rhythm.  No PVCs.  Ventricular rate 45 bpm.  Recent Labs: 06/12/2021: Hemoglobin 12.8; Magnesium 2.1; Platelets 187 10/08/2021: ALT 15; TSH 4.438 11/12/2021: B Natriuretic Peptide 459.9; BUN 22; Creatinine, Ser 1.11; Potassium 4.1; Sodium 139  Recent Lipid Panel No results found for: "CHOL", "TRIG", "HDL", "CHOLHDL", "VLDL", "LDLCALC", "LDLDIRECT"  Physical Exam:    VS:  BP 130/70   Pulse (!) 45   Ht '5\' 10"'$  (1.778 m)   Wt 202 lb (91.6 kg)   BMI 28.98 kg/m     Wt Readings from Last 3 Encounters:  01/14/22 202 lb (91.6 kg)  12/19/21 207 lb (93.9 kg)  11/13/21 206 lb (93.4 kg)     GEN:  Well nourished, well developed in no acute distress HEENT: Normal NECK: No JVD; No carotid bruits LYMPHATICS: No lymphadenopathy CARDIAC: RRR, no murmurs, rubs, gallops RESPIRATORY:  Clear to auscultation without rales, wheezing or rhonchi  ABDOMEN: Soft, non-tender, non-distended MUSCULOSKELETAL:  No edema; No deformity  SKIN: Warm and dry NEUROLOGIC:  Alert and oriented x 3 PSYCHIATRIC:  Normal affect        ASSESSMENT:    1. PVC's (premature ventricular contractions)   2. Nonischemic cardiomyopathy ([place])   3. Chronic systolic congestive heart failure (HCC)    PLAN:    In order of problems listed above:  #PVCs Appears to have resolved on amiodarone.  I do think they are contributing to his  systolic heart failure.  We will update a ZIO monitor 7 days to reassess the burden of PVCs on therapy.  I will decrease the dose of his amiodarone to 100 mg by mouth once daily.  Hopefully this will improve his resting heart rate.  I will repeat his echocardiogram today.  #Amiodarone monitoring Recent blood work was okay.  I will have him repeat his blood work at his 19-monthfollow-up appointment with an APP.  He will need a CMP, TSH and free T4 at that visit.  #Chronic systolic heart failure NYHA class I-II.  Warm  and dry on exam today.  Continue spironolactone, Entresto, Lasix, Farxiga, Coreg.  PVC suppression as above.  Update echocardiogram today.  Medication Adjustments/Labs and Tests Ordered: Current medicines are reviewed at length with the patient today.  Concerns regarding medicines are outlined above.  No orders of the defined types were placed in this encounter.  No orders of the defined types were placed in this encounter.    Signed, Lars Mage, MD, [facility], [facility] 01/14/2022 10:47 AM    Electrophysiology [facility]

## 2022-01-14 ENCOUNTER — Ambulatory Visit (INDEPENDENT_AMBULATORY_CARE_PROVIDER_SITE_OTHER): Payer: PPO | Admitting: Cardiology

## 2022-01-14 ENCOUNTER — Ambulatory Visit (INDEPENDENT_AMBULATORY_CARE_PROVIDER_SITE_OTHER): Payer: PPO

## 2022-01-14 ENCOUNTER — Encounter: Payer: Self-pay | Admitting: Cardiology

## 2022-01-14 VITALS — BP 130/70 | HR 45 | Ht 70.0 in | Wt 202.0 lb

## 2022-01-14 DIAGNOSIS — I5022 Chronic systolic (congestive) heart failure: Secondary | ICD-10-CM

## 2022-01-14 DIAGNOSIS — I493 Ventricular premature depolarization: Secondary | ICD-10-CM

## 2022-01-14 DIAGNOSIS — I428 Other cardiomyopathies: Secondary | ICD-10-CM

## 2022-01-14 MED ORDER — AMIODARONE HCL 200 MG PO TABS
100.0000 mg | ORAL_TABLET | Freq: Every day | ORAL | 3 refills | Status: DC
Start: 2022-01-14 — End: 2022-04-27

## 2022-01-14 NOTE — Patient Instructions (Addendum)
Medications: Reduce your Amiodarone to 100 mg daily  Your physician recommends that you continue on your current medications as directed. Please refer to the Current Medication list given to you today. *If you need a refill on your cardiac medications before your next appointment, please call your pharmacy*  Lab Work: None. If you have labs (blood work) drawn today and your tests are completely normal, you will receive your results only by: [place] (if you have MyChart) OR A paper copy in the mail If you have any lab test that is abnormal or we need to change your treatment, we will call you to review the results.  Testing/Procedures: Your physician has requested that you have an echocardiogram. Echocardiography is a painless test that uses sound waves to create images of your heart. It provides your doctor with information about the size and shape of your heart and how well your heart's chambers and valves are working. This procedure takes approximately one hour. There are no restrictions for this procedure.  Your physician has recommended that you wear a heart monitor. Heart monitors are medical devices that record the heart's electrical activity. Doctors most often use these monitors to diagnose arrhythmias. Arrhythmias are problems with the speed or rhythm of the heartbeat. The monitor is a small, portable device. You can wear one while you do your normal daily activities. This is usually used to diagnose what is causing palpitations/syncope (passing out).   Follow-Up: At [facility], you and your health needs are our priority.  As part of our continuing mission to provide you with exceptional heart care, we have created designated Provider Care Teams.  These Care Teams include your primary Cardiologist (physician) and Advanced Practice Providers (APPs -  Physician Assistants and Nurse Practitioners) who all work together to provide you with the care you need, when you need  it.  Your physician wants you to follow-up in: 6 months with one of the following Advanced Practice Providers on your designated Care Team:    Ignacia Bayley, NP Christell Faith PA Cadence Kathlen Mody PA    You will receive a reminder letter in the mail two months in advance. If you don't receive a letter, please call our office to schedule the follow-up appointment.  We recommend signing up for the patient portal called "MyChart".  Sign up information is provided on this After Visit Summary.  MyChart is used to connect with patients for Virtual Visits (Telemedicine).  Patients are able to view lab/test results, encounter notes, upcoming appointments, etc.  Non-urgent messages can be sent to your provider as well.   To learn more about what you can do with MyChart, go to [email or web address].    Any Other Special Instructions Will Be Listed Below (If Applicable).  ZIO XT- Long Term Monitor Instructions  Your physician has requested you wear a ZIO patch monitor for 14 days.  This is a single patch monitor. [employer] supplies one patch monitor per enrollment. Additional stickers are not available. Please do not apply patch if you will be having a Nuclear Stress Test,  Echocardiogram, Cardiac CT, MRI, or Chest Xray during the period you would be wearing the  monitor. The patch cannot be worn during these tests. You cannot remove and re-apply the  ZIO XT patch monitor.  Your ZIO patch monitor will be mailed 3 day USPS to your address on file. It may take 3-5 days  to receive your monitor after you have been enrolled.  Once you have received your monitor,  please review the enclosed instructions. Your monitor  has already been registered assigning a specific monitor serial # to you.  Billing and Patient Assistance Program Information  We have supplied [employer] with any of your insurance information on file for billing purposes. [employer] offers a sliding scale Patient Assistance Program for patients that do  not have  insurance, or whose insurance does not completely cover the cost of the ZIO monitor.  You must apply for the Patient Assistance Program to qualify for this discounted rate.  To apply, please call [employer] at [phone], select option 4, select option 2, ask to apply for  Patient Assistance Program. Theodore Demark will ask your household income, and how many people  are in your household. They will quote your out-of-pocket cost based on that information.  [employer] will also be able to set up a 68-month interest-free payment plan if needed.  Applying the monitor   Shave hair from upper left chest.  Hold abrader disc by orange tab. Rub abrader in 40 strokes over the upper left chest as  indicated in your monitor instructions.  Clean area with 4 enclosed alcohol pads. Let dry.  Apply patch as indicated in monitor instructions. Patch will be placed under collarbone on left  side of chest with arrow pointing upward.  Rub patch adhesive wings for 2 minutes. Remove white label marked "1". Remove the white  label marked "2". Rub patch adhesive wings for 2 additional minutes.  While looking in a mirror, press and release button in center of patch. A small green light will  flash 3-4 times. This will be your only indicator that the monitor has been turned on.  Do not shower for the first 24 hours. You may shower after the first 24 hours.  Press the button if you feel a symptom. You will hear a small click. Record Date, Time and  Symptom in the Patient Logbook.  When you are ready to remove the patch, follow instructions on the last 2 pages of Patient  Logbook. Stick patch monitor onto the last page of Patient Logbook.  Place Patient Logbook in the blue and white box. Use locking tab on box and tape box closed  securely. The blue and white box has prepaid postage on it. Please place it in the mailbox as  soon as possible. Your physician should have your test results approximately 7 days after the   [facility]  Call IFairfaxat [phone] you have questions regarding  your ZIO XT patch monitor. Call them immediately if you see an orange light blinking on your  monitor.  If your monitor falls off in less than 4 days, contact our Monitor department at [phone]  If your monitor becomes loose or falls off after 4 days call [employer] at [phone]  suggestions on securing your monitor

## 2022-01-16 ENCOUNTER — Encounter: Payer: PPO | Attending: Internal Medicine | Admitting: *Deleted

## 2022-01-16 DIAGNOSIS — I5022 Chronic systolic (congestive) heart failure: Secondary | ICD-10-CM

## 2022-01-16 NOTE — Progress Notes (Signed)
Initial telephone orientation completed. Diagnosis can be found in [facility] 6/30. EP orientation scheduled for Wednesday 8/9 at 8am.

## 2022-01-23 ENCOUNTER — Ambulatory Visit (INDEPENDENT_AMBULATORY_CARE_PROVIDER_SITE_OTHER): Payer: PPO

## 2022-01-23 DIAGNOSIS — I5022 Chronic systolic (congestive) heart failure: Secondary | ICD-10-CM

## 2022-01-23 DIAGNOSIS — I428 Other cardiomyopathies: Secondary | ICD-10-CM | POA: Diagnosis not present

## 2022-01-23 DIAGNOSIS — I493 Ventricular premature depolarization: Secondary | ICD-10-CM | POA: Diagnosis not present

## 2022-01-23 LAB — ECHOCARDIOGRAM COMPLETE
AR max vel: 1.52 cm2
AV Area VTI: 1.51 cm2
AV Area mean vel: 1.45 cm2
AV Mean grad: 8 mmHg
AV Peak grad: 15.2 mmHg
Ao pk vel: 1.95 m/s
Area-P 1/2: 3.74 cm2
Calc EF: 51 %
S' Lateral: 4.5 cm
Single Plane A2C EF: 45.6 %
Single Plane A4C EF: 56.5 %

## 2022-01-23 MED ORDER — PERFLUTREN LIPID MICROSPHERE
1.0000 mL | INTRAVENOUS | Status: AC | PRN
  Administered 2022-01-23: 2 mL via INTRAVENOUS

## 2022-01-24 DIAGNOSIS — I428 Other cardiomyopathies: Secondary | ICD-10-CM

## 2022-01-24 DIAGNOSIS — I493 Ventricular premature depolarization: Secondary | ICD-10-CM

## 2022-01-24 DIAGNOSIS — I5022 Chronic systolic (congestive) heart failure: Secondary | ICD-10-CM | POA: Diagnosis not present

## 2022-01-28 ENCOUNTER — Encounter: Payer: PPO | Attending: Internal Medicine | Admitting: *Deleted

## 2022-01-28 VITALS — Ht 71.0 in | Wt 201.8 lb

## 2022-01-28 DIAGNOSIS — I5022 Chronic systolic (congestive) heart failure: Secondary | ICD-10-CM | POA: Diagnosis not present

## 2022-01-28 DIAGNOSIS — Z48812 Encounter for surgical aftercare following surgery on the circulatory system: Secondary | ICD-10-CM | POA: Insufficient documentation

## 2022-01-28 NOTE — Patient Instructions (Addendum)
Patient Instructions  Patient Details  Name: Cody SALLS Sr. MRN: [id number] Date of Birth: Jan 29, 1941 Referring Provider:  Yolonda Kida, MD  Below are your personal goals for exercise, nutrition, and risk factors. Our goal is to help you stay on track towards obtaining and maintaining these goals. We will be discussing your progress on these goals with you throughout the program.  Initial Exercise Prescription:  Initial Exercise Prescription - 01/28/22 0900       Date of Initial Exercise RX and Referring Provider   Date 01/28/22    Referring Provider Lujean Amel MD      Oxygen   Maintain Oxygen Saturation 88% or higher      Treadmill   MPH 2.8    Grade 1    Minutes 15    METs 3.53      NuStep   Level 3    SPM 80    Minutes 15    METs 2.5      REL-XR   Level 3    Speed 50    Minutes 15    METs 2.5      Track   Laps 40    Minutes 15    METs 3.18      Prescription Details   Frequency (times per week) 3    Duration Progress to 30 minutes of continuous aerobic without signs/symptoms of physical distress      Intensity   THRR 40-80% of Max Heartrate 84-121    Ratings of Perceived Exertion 11-13    Perceived Dyspnea 0-4      Progression   Progression Continue to progress workloads to maintain intensity without signs/symptoms of physical distress.      Resistance Training   Training Prescription Yes    Weight 4 lb    Reps 10-15             Exercise Goals: Frequency: Be able to perform aerobic exercise two to three times per week in program working toward 2-5 days per week of home exercise.  Intensity: Work with a perceived exertion of 11 (fairly light) - 15 (hard) while following your exercise prescription.  We will make changes to your prescription with you as you progress through the program.   Duration: Be able to do 30 to 45 minutes of continuous aerobic exercise in addition to a 5 minute warm-up and a 5 minute cool-down routine.    Nutrition Goals: Your personal nutrition goals will be established when you do your nutrition analysis with the dietician.  The following are general nutrition guidelines to follow: Cholesterol < '200mg'$ /day Sodium < '1500mg'$ /day Fiber: Men over 50 yrs - 30 grams per day  Personal Goals:  Personal Goals and Risk Factors at Admission - 01/28/22 0938       Core Components/Risk Factors/Patient Goals on Admission    Weight Management Yes;Weight Loss    Intervention Weight Management: Develop a combined nutrition and exercise program designed to reach desired caloric intake, while maintaining appropriate intake of nutrient and fiber, sodium and fats, and appropriate energy expenditure required for the weight goal.;Weight Management: Provide education and appropriate resources to help participant work on and attain dietary goals.    Admit Weight 201 lb 12.8 oz (91.5 kg)    Goal Weight: Short Term 197 lb (89.4 kg)    Goal Weight: Long Term 190 lb (86.2 kg)    Expected Outcomes Long Term: Adherence to nutrition and physical activity/exercise program aimed toward attainment of established weight  goal;Short Term: Continue to assess and modify interventions until short term weight is achieved;Weight Loss: Understanding of general recommendations for a balanced deficit meal plan, which promotes 1-2 lb weight loss per week and includes a negative energy balance of [phone] kcal/d;Understanding recommendations for meals to include 15-35% energy as protein, 25-35% energy from fat, 35-60% energy from carbohydrates, less than '200mg'$  of dietary cholesterol, 20-35 gm of total fiber daily;Understanding of distribution of calorie intake throughout the day with the consumption of 4-5 meals/snacks    Heart Failure Yes    Intervention Provide a combined exercise and nutrition program that is supplemented with education, support and counseling about heart failure. Directed toward relieving symptoms such as shortness of  breath, decreased exercise tolerance, and extremity edema.    Expected Outcomes Improve functional capacity of life;Short term: Attendance in program 2-3 days a week with increased exercise capacity. Reported lower sodium intake. Reported increased fruit and vegetable intake. Reports medication compliance.;Short term: Daily weights obtained and reported for increase. Utilizing diuretic protocols set by physician.;Long term: Adoption of self-care skills and reduction of barriers for early signs and symptoms recognition and intervention leading to self-care maintenance.    Hypertension Yes    Intervention Provide education on lifestyle modifcations including regular physical activity/exercise, weight management, moderate sodium restriction and increased consumption of fresh fruit, vegetables, and low fat dairy, alcohol moderation, and smoking cessation.;Monitor prescription use compliance.    Expected Outcomes Long Term: Maintenance of blood pressure at goal levels.;Short Term: Continued assessment and intervention until BP is < 140/34m HG in hypertensive participants. < 130/835mHG in hypertensive participants with diabetes, heart failure or chronic kidney disease.    Lipids Yes    Intervention Provide education and support for participant on nutrition & aerobic/resistive exercise along with prescribed medications to achieve LDL '70mg'$ , HDL >'40mg'$ .    Expected Outcomes Short Term: Participant states understanding of desired cholesterol values and is compliant with medications prescribed. Participant is following exercise prescription and nutrition guidelines.;Long Term: Cholesterol controlled with medications as prescribed, with individualized exercise RX and with personalized nutrition plan. Value goals: LDL < '70mg'$ , HDL > 40 mg.             Tobacco Use Initial Evaluation: Social History   Tobacco Use  Smoking Status Never  Smokeless Tobacco Never    Exercise Goals and Review:  Exercise Goals      Row Name 01/28/22 0937             Exercise Goals   Increase Physical Activity Yes       Intervention Provide advice, education, support and counseling about physical activity/exercise needs.;Develop an individualized exercise prescription for aerobic and resistive training based on initial evaluation findings, risk stratification, comorbidities and participant's personal goals.       Expected Outcomes Short Term: Attend rehab on a regular basis to increase amount of physical activity.;Long Term: Add in home exercise to make exercise part of routine and to increase amount of physical activity.;Long Term: Exercising regularly at least 3-5 days a week.       Increase Strength and Stamina Yes       Intervention Provide advice, education, support and counseling about physical activity/exercise needs.;Develop an individualized exercise prescription for aerobic and resistive training based on initial evaluation findings, risk stratification, comorbidities and participant's personal goals.       Expected Outcomes Short Term: Increase workloads from initial exercise prescription for resistance, speed, and METs.;Short Term: Perform resistance training exercises routinely during rehab  and add in resistance training at home;Long Term: Improve cardiorespiratory fitness, muscular endurance and strength as measured by increased METs and functional capacity (6MWT)       Able to understand and use rate of perceived exertion (RPE) scale Yes       Intervention Provide education and explanation on how to use RPE scale       Expected Outcomes Short Term: Able to use RPE daily in rehab to express subjective intensity level;Long Term:  Able to use RPE to guide intensity level when exercising independently       Able to understand and use Dyspnea scale Yes       Intervention Provide education and explanation on how to use Dyspnea scale       Expected Outcomes Long Term: Able to use Dyspnea scale to guide intensity  level when exercising independently;Short Term: Able to use Dyspnea scale daily in rehab to express subjective sense of shortness of breath during exertion       Knowledge and understanding of Target Heart Rate Range (THRR) Yes       Intervention Provide education and explanation of THRR including how the numbers were predicted and where they are located for reference       Expected Outcomes Short Term: Able to state/look up THRR;Short Term: Able to use daily as guideline for intensity in rehab;Long Term: Able to use THRR to govern intensity when exercising independently       Able to check pulse independently Yes       Intervention Provide education and demonstration on how to check pulse in carotid and radial arteries.;Review the importance of being able to check your own pulse for safety during independent exercise       Expected Outcomes Short Term: Able to explain why pulse checking is important during independent exercise;Long Term: Able to check pulse independently and accurately       Understanding of Exercise Prescription Yes       Intervention Provide education, explanation, and written materials on patient's individual exercise prescription       Expected Outcomes Short Term: Able to explain program exercise prescription;Long Term: Able to explain home exercise prescription to exercise independently                Copy of goals given to participant.

## 2022-01-28 NOTE — Progress Notes (Signed)
Cardiac Individual Treatment Plan  Patient Details  Name: Cody EARNHART Sr. MRN: [id number] Date of Birth: 01/13/1941 Referring Provider:   Flowsheet Row Cardiac Rehab from 01/28/2022 in [facility] Cardiac and Pulmonary Rehab  Referring Provider Lujean Amel MD       Initial Encounter Date:  Flowsheet Row Cardiac Rehab from 01/28/2022 in [facility] Cardiac and Pulmonary Rehab  Date 01/28/22       Visit Diagnosis: Heart failure, chronic systolic (HCC)  Patient's Home Medications on Admission:  Current Outpatient Medications:    amiodarone (PACERONE) 200 MG tablet, Take 0.5 tablets (100 mg total) by mouth daily., Disp: 45 tablet, Rfl: 3   aspirin EC 81 MG tablet, Take 81 mg by mouth daily., Disp: , Rfl:    carvedilol (COREG) 6.25 MG tablet, Take 1 tablet (6.25 mg total) by mouth 2 (two) times daily with a meal. (Patient taking differently: Take 3.125 mg by mouth 2 (two) times daily with a meal.), Disp: 60 tablet, Rfl: 0   dapagliflozin propanediol (FARXIGA) 10 MG TABS tablet, Take 1 tablet (10 mg total) by mouth daily before breakfast., Disp: 90 tablet, Rfl: 3   dutasteride (AVODART) 0.5 MG capsule, Take 0.5 mg by mouth daily., Disp: , Rfl:    furosemide (LASIX) 20 MG tablet, Take 20 mg by mouth daily., Disp: , Rfl:    gabapentin (NEURONTIN) 300 MG capsule, Take 300 mg by mouth 3 (three) times daily., Disp: , Rfl:    rosuvastatin (CRESTOR) 10 MG tablet, Take 10 mg by mouth daily., Disp: , Rfl:    sacubitril-valsartan (ENTRESTO) 24-26 MG, Take 1 tablet by mouth 2 (two) times daily., Disp: 180 tablet, Rfl: 3   spironolactone (ALDACTONE) 25 MG tablet, Take 0.5 tablets (12.5 mg total) by mouth daily., Disp: 15 tablet, Rfl: 0  Past Medical History: Past Medical History:  Diagnosis Date   Arrhythmia    atrial fibrillation   BPH (benign prostatic hyperplasia)    CHF (congestive heart failure) (HCC)    Complication of anesthesia    Edema    MILD FEET/ANKLES OCCAS   History of kidney stones     Hyperlipidemia    Hypertension    Neuromuscular disorder (HCC)    TRIGEMINAL NEURALGIA   Palpitations    PONV (postoperative nausea and vomiting)    Sleep apnea    DOES NOT USE CPAP   Vertigo     Tobacco Use: Social History   Tobacco Use  Smoking Status Never  Smokeless Tobacco Never    Labs: Review Flowsheet        No data to display           Exercise Target Goals: Exercise Program Goal: Individual exercise prescription set using results from initial 6 min walk test and THRR while considering  patient's activity barriers and safety.   Exercise Prescription Goal: Initial exercise prescription builds to 30-45 minutes a day of aerobic activity, 2-3 days per week.  Home exercise guidelines will be given to patient during program as part of exercise prescription that the participant will acknowledge.   Education: Aerobic Exercise: - Group verbal and visual presentation on the components of exercise prescription. Introduces F.I.T.T principle from ACSM for exercise prescriptions.  Reviews F.I.T.T. principles of aerobic exercise including progression. Written material given at graduation. Flowsheet Row Cardiac Rehab from 01/28/2022 in [facility] Cardiac and Pulmonary Rehab  Education need identified 01/28/22       Education: Resistance Exercise: - Group verbal and visual presentation on the components of exercise  prescription. Introduces F.I.T.T principle from ACSM for exercise prescriptions  Reviews F.I.T.T. principles of resistance exercise including progression. Written material given at graduation.    Education: Exercise & Equipment Safety: - Individual verbal instruction and demonstration of equipment use and safety with use of the equipment. Flowsheet Row Cardiac Rehab from 01/28/2022 in [facility] Cardiac and Pulmonary Rehab  Date 01/28/22  Educator [facility]  Instruction Review Code 1- Verbalizes Understanding       Education: Exercise Physiology & General Exercise  Guidelines: - Group verbal and written instruction with models to review the exercise physiology of the cardiovascular system and associated critical values. Provides general exercise guidelines with specific guidelines to those with heart or lung disease.    Education: Flexibility, Balance, Mind/Body Relaxation: - Group verbal and visual presentation with interactive activity on the components of exercise prescription. Introduces F.I.T.T principle from ACSM for exercise prescriptions. Reviews F.I.T.T. principles of flexibility and balance exercise training including progression. Also discusses the mind body connection.  Reviews various relaxation techniques to help reduce and manage stress (i.e. Deep breathing, progressive muscle relaxation, and visualization). Balance handout provided to take home. Written material given at graduation.   Activity Barriers & Risk Stratification:  Activity Barriers & Cardiac Risk Stratification - 01/28/22 0930       Activity Barriers & Cardiac Risk Stratification   Activity Barriers Deconditioning;Muscular Weakness    Cardiac Risk Stratification High             6 Minute Walk:  6 Minute Walk     Row Name 01/28/22 0929         6 Minute Walk   Phase Initial     Distance 1520 feet     Walk Time 6 minutes     # of Rest Breaks 0     MPH 2.88     METS 2.45     RPE 9     VO2 Peak 8.58     Symptoms No     Resting HR 47 bpm     Resting BP 116/60     Resting Oxygen Saturation  97 %     Exercise Oxygen Saturation  during 6 min walk 96 %     Max Ex. HR 67 bpm     Max Ex. BP 134/72     2 Minute Post BP 124/56              Oxygen Initial Assessment:   Oxygen Re-Evaluation:   Oxygen Discharge (Final Oxygen Re-Evaluation):   Initial Exercise Prescription:  Initial Exercise Prescription - 01/28/22 0900       Date of Initial Exercise RX and Referring Provider   Date 01/28/22    Referring Provider Lujean Amel MD      Oxygen    Maintain Oxygen Saturation 88% or higher      Treadmill   MPH 2.8    Grade 1    Minutes 15    METs 3.53      NuStep   Level 3    SPM 80    Minutes 15    METs 2.5      REL-XR   Level 3    Speed 50    Minutes 15    METs 2.5      Track   Laps 40    Minutes 15    METs 3.18      Prescription Details   Frequency (times per week) 3    Duration Progress to 30 minutes  of continuous aerobic without signs/symptoms of physical distress      Intensity   THRR 40-80% of Max Heartrate 84-121    Ratings of Perceived Exertion 11-13    Perceived Dyspnea 0-4      Progression   Progression Continue to progress workloads to maintain intensity without signs/symptoms of physical distress.      Resistance Training   Training Prescription Yes    Weight 4 lb    Reps 10-15             Perform Capillary Blood Glucose checks as needed.  Exercise Prescription Changes:   Exercise Prescription Changes     Row Name 01/28/22 0900             Response to Exercise   Blood Pressure (Admit) 116/60       Blood Pressure (Exercise) 134/72       Blood Pressure (Exit) 124/56       Heart Rate (Admit) 47 bpm       Heart Rate (Exercise) 67 bpm       Heart Rate (Exit) 47 bpm       Oxygen Saturation (Admit) 97 %       Oxygen Saturation (Exercise) 96 %       Rating of Perceived Exertion (Exercise) 9       Symptoms none       Comments walk test results                Exercise Comments:   Exercise Goals and Review:   Exercise Goals     Row Name 01/28/22 0937             Exercise Goals   Increase Physical Activity Yes       Intervention Provide advice, education, support and counseling about physical activity/exercise needs.;Develop an individualized exercise prescription for aerobic and resistive training based on initial evaluation findings, risk stratification, comorbidities and participant's personal goals.       Expected Outcomes Short Term: Attend rehab on a regular  basis to increase amount of physical activity.;Long Term: Add in home exercise to make exercise part of routine and to increase amount of physical activity.;Long Term: Exercising regularly at least 3-5 days a week.       Increase Strength and Stamina Yes       Intervention Provide advice, education, support and counseling about physical activity/exercise needs.;Develop an individualized exercise prescription for aerobic and resistive training based on initial evaluation findings, risk stratification, comorbidities and participant's personal goals.       Expected Outcomes Short Term: Increase workloads from initial exercise prescription for resistance, speed, and METs.;Short Term: Perform resistance training exercises routinely during rehab and add in resistance training at home;Long Term: Improve cardiorespiratory fitness, muscular endurance and strength as measured by increased METs and functional capacity (6MWT)       Able to understand and use rate of perceived exertion (RPE) scale Yes       Intervention Provide education and explanation on how to use RPE scale       Expected Outcomes Short Term: Able to use RPE daily in rehab to express subjective intensity level;Long Term:  Able to use RPE to guide intensity level when exercising independently       Able to understand and use Dyspnea scale Yes       Intervention Provide education and explanation on how to use Dyspnea scale       Expected Outcomes Long Term: Able to  use Dyspnea scale to guide intensity level when exercising independently;Short Term: Able to use Dyspnea scale daily in rehab to express subjective sense of shortness of breath during exertion       Knowledge and understanding of Target Heart Rate Range (THRR) Yes       Intervention Provide education and explanation of THRR including how the numbers were predicted and where they are located for reference       Expected Outcomes Short Term: Able to state/look up THRR;Short Term: Able to  use daily as guideline for intensity in rehab;Long Term: Able to use THRR to govern intensity when exercising independently       Able to check pulse independently Yes       Intervention Provide education and demonstration on how to check pulse in carotid and radial arteries.;Review the importance of being able to check your own pulse for safety during independent exercise       Expected Outcomes Short Term: Able to explain why pulse checking is important during independent exercise;Long Term: Able to check pulse independently and accurately       Understanding of Exercise Prescription Yes       Intervention Provide education, explanation, and written materials on patient's individual exercise prescription       Expected Outcomes Short Term: Able to explain program exercise prescription;Long Term: Able to explain home exercise prescription to exercise independently                Exercise Goals Re-Evaluation :   Discharge Exercise Prescription (Final Exercise Prescription Changes):  Exercise Prescription Changes - 01/28/22 0900       Response to Exercise   Blood Pressure (Admit) 116/60    Blood Pressure (Exercise) 134/72    Blood Pressure (Exit) 124/56    Heart Rate (Admit) 47 bpm    Heart Rate (Exercise) 67 bpm    Heart Rate (Exit) 47 bpm    Oxygen Saturation (Admit) 97 %    Oxygen Saturation (Exercise) 96 %    Rating of Perceived Exertion (Exercise) 9    Symptoms none    Comments walk test results             Nutrition:  Target Goals: Understanding of nutrition guidelines, daily intake of sodium '1500mg'$ , cholesterol '200mg'$ , calories 30% from fat and 7% or less from saturated fats, daily to have 5 or more servings of fruits and vegetables.  Education: All About Nutrition: -Group instruction provided by verbal, written material, interactive activities, discussions, models, and posters to present general guidelines for heart healthy nutrition including fat, fiber, MyPlate,  the role of sodium in heart healthy nutrition, utilization of the nutrition label, and utilization of this knowledge for meal planning. Follow up email sent as well. Written material given at graduation. Flowsheet Row Cardiac Rehab from 01/28/2022 in [facility] Cardiac and Pulmonary Rehab  Education need identified 01/28/22       Biometrics:  Pre Biometrics - 01/28/22 0937       Pre Biometrics   Height '5\' 11"'$  (1.803 m)    Weight 201 lb 12.8 oz (91.5 kg)    BMI (Calculated) 28.16    Single Leg Stand 13.7 seconds              Nutrition Therapy Plan and Nutrition Goals:  Nutrition Therapy & Goals - 01/28/22 0938       Intervention Plan   Intervention Prescribe, educate and counsel regarding individualized specific dietary modifications aiming towards targeted core components such  as weight, hypertension, lipid management, diabetes, heart failure and other comorbidities.    Expected Outcomes Short Term Goal: Understand basic principles of dietary content, such as calories, fat, sodium, cholesterol and nutrients.;Short Term Goal: A plan has been developed with personal nutrition goals set during dietitian appointment.;Long Term Goal: Adherence to prescribed nutrition plan.             Nutrition Assessments:  MEDIFICTS Score Key: ?70 Need to make dietary changes  40-70 Heart Healthy Diet ? 40 Therapeutic Level Cholesterol Diet  Flowsheet Row Cardiac Rehab from 01/28/2022 in [facility] Cardiac and Pulmonary Rehab  Picture Your Plate Total Score on Admission 64      Picture Your Plate Scores: <29 Unhealthy dietary pattern with much room for improvement. 41-50 Dietary pattern unlikely to meet recommendations for good health and room for improvement. 51-60 More healthful dietary pattern, with some room for improvement.  >60 Healthy dietary pattern, although there may be some specific behaviors that could be improved.    Nutrition Goals Re-Evaluation:   Nutrition Goals Discharge  (Final Nutrition Goals Re-Evaluation):   Psychosocial: Target Goals: Acknowledge presence or absence of significant depression and/or stress, maximize coping skills, provide positive support system. Participant is able to verbalize types and ability to use techniques and skills needed for reducing stress and depression.   Education: Stress, Anxiety, and Depression - Group verbal and visual presentation to define topics covered.  Reviews how body is impacted by stress, anxiety, and depression.  Also discusses healthy ways to reduce stress and to treat/manage anxiety and depression.  Written material given at graduation.   Education: Sleep Hygiene -Provides group verbal and written instruction about how sleep can affect your health.  Define sleep hygiene, discuss sleep cycles and impact of sleep habits. Review good sleep hygiene tips.    Initial Review & Psychosocial Screening:  Initial Psych Review & Screening - 01/16/22 1514       Initial Review   Current issues with Current Sleep Concerns      Family Dynamics   Good Support System? Yes   wife; children, church     Barriers   Psychosocial barriers to participate in program There are no identifiable barriers or psychosocial needs.;The patient should benefit from training in stress management and relaxation.      Screening Interventions   Interventions Encouraged to exercise;Provide feedback about the scores to participant;To provide support and resources with identified psychosocial needs    Expected Outcomes Short Term goal: Utilizing psychosocial counselor, staff and physician to assist with identification of specific Stressors or current issues interfering with healing process. Setting desired goal for each stressor or current issue identified.;Long Term Goal: Stressors or current issues are controlled or eliminated.;Short Term goal: Identification and review with participant of any Quality of Life or Depression concerns found by  scoring the questionnaire.;Long Term goal: The participant improves quality of Life and PHQ9 Scores as seen by post scores and/or verbalization of changes             Quality of Life Scores:   Quality of Life - 01/28/22 0937       Quality of Life   Select Quality of Life      Quality of Life Scores   Health/Function Pre 23 %    Socioeconomic Pre 27.38 %    Psych/Spiritual Pre 29.14 %    Family Pre 28.8 %    GLOBAL Pre 26.06 %  Scores of 19 and below usually indicate a poorer quality of life in these areas.  A difference of  2-3 points is a clinically meaningful difference.  A difference of 2-3 points in the total score of the Quality of Life Index has been associated with significant improvement in overall quality of life, self-image, physical symptoms, and general health in studies assessing change in quality of life.  PHQ-9: Review Flowsheet       01/28/2022 07/02/2021  Depression screen PHQ 2/9  Decreased Interest 0 0  Down, Depressed, Hopeless 0 0  PHQ - 2 Score 0 0  Altered sleeping 0 -  Tired, decreased energy 2 -  Change in appetite 0 -  Feeling bad or failure about yourself  0 -  Trouble concentrating 0 -  Moving slowly or fidgety/restless 0 -  Suicidal thoughts 0 -  PHQ-9 Score 2 -  Difficult doing work/chores Not difficult at all -   Interpretation of Total Score  Total Score Depression Severity:  1-4 = Minimal depression, 5-9 = Mild depression, 10-14 = Moderate depression, 15-19 = Moderately severe depression, 20-27 = Severe depression   Psychosocial Evaluation and Intervention:  Psychosocial Evaluation - 01/16/22 1530       Psychosocial Evaluation & Interventions   Interventions Encouraged to exercise with the program and follow exercise prescription    Comments Mr. Nesbit reports feeling well given his current health conditions. He has a great support system that includes his wife, local children, church family and friends. He works part  time at [employer] and enjoys his active time there. He and his wife were walking this past spring and he wants to get back to that. His current concern is his sleep because his cpap has not been fitting him well. Some nights he has no problem but others it is a bothersome. He states he is working on building up his tolerance. He is excited about coming to the program to work on his stamina and get back to being more active    Expected Outcomes Short: attend cardiac rehab for education and exercise. Long: develop and maintain positive self care habits.    Continue Psychosocial Services  Follow up required by staff             Psychosocial Re-Evaluation:   Psychosocial Discharge (Final Psychosocial Re-Evaluation):   Vocational Rehabilitation: Provide vocational rehab assistance to qualifying candidates.   Vocational Rehab Evaluation & Intervention:  Vocational Rehab - 01/16/22 1513       Initial Vocational Rehab Evaluation & Intervention   Assessment shows need for Vocational Rehabilitation No             Education: Education Goals: Education classes will be provided on a variety of topics geared toward better understanding of heart health and risk factor modification. Participant will state understanding/return demonstration of topics presented as noted by education test scores.  Learning Barriers/Preferences:  Learning Barriers/Preferences - 01/16/22 1513       Learning Barriers/Preferences   Learning Barriers None    Learning Preferences Individual Instruction             General Cardiac Education Topics:  AED/CPR: - Group verbal and written instruction with the use of models to demonstrate the basic use of the AED with the basic ABC's of resuscitation.   Anatomy and Cardiac Procedures: - Group verbal and visual presentation and models provide information about basic cardiac anatomy and function. Reviews the testing methods done to diagnose heart  disease and the outcomes of the test results. Describes the treatment choices: Medical Management, Angioplasty, or Coronary Bypass Surgery for treating various heart conditions including Myocardial Infarction, Angina, Valve Disease, and Cardiac Arrhythmias.  Written material given at graduation. Flowsheet Row Cardiac Rehab from 01/28/2022 in [facility] Cardiac and Pulmonary Rehab  Education need identified 01/28/22       Medication Safety: - Group verbal and visual instruction to review commonly prescribed medications for heart and lung disease. Reviews the medication, class of the drug, and side effects. Includes the steps to properly store meds and maintain the prescription regimen.  Written material given at graduation.   Intimacy: - Group verbal instruction through game format to discuss how heart and lung disease can affect sexual intimacy. Written material given at graduation..   Know Your Numbers and Heart Failure: - Group verbal and visual instruction to discuss disease risk factors for cardiac and pulmonary disease and treatment options.  Reviews associated critical values for Overweight/Obesity, Hypertension, Cholesterol, and Diabetes.  Discusses basics of heart failure: signs/symptoms and treatments.  Introduces Heart Failure Zone chart for action plan for heart failure.  Written material given at graduation.   Infection Prevention: - Provides verbal and written material to individual with discussion of infection control including proper hand washing and proper equipment cleaning during exercise session. Flowsheet Row Cardiac Rehab from 01/28/2022 in [facility] Cardiac and Pulmonary Rehab  Date 01/28/22  Educator [facility]  Instruction Review Code 1- Verbalizes Understanding       Falls Prevention: - Provides verbal and written material to individual with discussion of falls prevention and safety. Flowsheet Row Cardiac Rehab from 01/28/2022 in [facility] Cardiac and Pulmonary Rehab  Date 01/28/22   Educator [facility]  Instruction Review Code 1- Verbalizes Understanding       Other: -Provides group and verbal instruction on various topics (see comments)   Knowledge Questionnaire Score:  Knowledge Questionnaire Score - 01/28/22 8325       Knowledge Questionnaire Score   Pre Score 21/26             Core Components/Risk Factors/Patient Goals at Admission:  Personal Goals and Risk Factors at Admission - 01/28/22 0938       Core Components/Risk Factors/Patient Goals on Admission    Weight Management Yes;Weight Loss    Intervention Weight Management: Develop a combined nutrition and exercise program designed to reach desired caloric intake, while maintaining appropriate intake of nutrient and fiber, sodium and fats, and appropriate energy expenditure required for the weight goal.;Weight Management: Provide education and appropriate resources to help participant work on and attain dietary goals.    Admit Weight 201 lb 12.8 oz (91.5 kg)    Goal Weight: Short Term 197 lb (89.4 kg)    Goal Weight: Long Term 190 lb (86.2 kg)    Expected Outcomes Long Term: Adherence to nutrition and physical activity/exercise program aimed toward attainment of established weight goal;Short Term: Continue to assess and modify interventions until short term weight is achieved;Weight Loss: Understanding of general recommendations for a balanced deficit meal plan, which promotes 1-2 lb weight loss per week and includes a negative energy balance of [phone] kcal/d;Understanding recommendations for meals to include 15-35% energy as protein, 25-35% energy from fat, 35-60% energy from carbohydrates, less than '200mg'$  of dietary cholesterol, 20-35 gm of total fiber daily;Understanding of distribution of calorie intake throughout the day with the consumption of 4-5 meals/snacks    Heart Failure Yes    Intervention Provide a combined exercise and nutrition  program that is supplemented with education, support and  counseling about heart failure. Directed toward relieving symptoms such as shortness of breath, decreased exercise tolerance, and extremity edema.    Expected Outcomes Improve functional capacity of life;Short term: Attendance in program 2-3 days a week with increased exercise capacity. Reported lower sodium intake. Reported increased fruit and vegetable intake. Reports medication compliance.;Short term: Daily weights obtained and reported for increase. Utilizing diuretic protocols set by physician.;Long term: Adoption of self-care skills and reduction of barriers for early signs and symptoms recognition and intervention leading to self-care maintenance.    Hypertension Yes    Intervention Provide education on lifestyle modifcations including regular physical activity/exercise, weight management, moderate sodium restriction and increased consumption of fresh fruit, vegetables, and low fat dairy, alcohol moderation, and smoking cessation.;Monitor prescription use compliance.    Expected Outcomes Long Term: Maintenance of blood pressure at goal levels.;Short Term: Continued assessment and intervention until BP is < 140/74m HG in hypertensive participants. < 130/841mHG in hypertensive participants with diabetes, heart failure or chronic kidney disease.    Lipids Yes    Intervention Provide education and support for participant on nutrition & aerobic/resistive exercise along with prescribed medications to achieve LDL '70mg'$ , HDL >'40mg'$ .    Expected Outcomes Short Term: Participant states understanding of desired cholesterol values and is compliant with medications prescribed. Participant is following exercise prescription and nutrition guidelines.;Long Term: Cholesterol controlled with medications as prescribed, with individualized exercise RX and with personalized nutrition plan. Value goals: LDL < '70mg'$ , HDL > 40 mg.             Education:Diabetes - Individual verbal and written instruction to review  signs/symptoms of diabetes, desired ranges of glucose level fasting, after meals and with exercise. Acknowledge that pre and post exercise glucose checks will be done for 3 sessions at entry of program.   Core Components/Risk Factors/Patient Goals Review:    Core Components/Risk Factors/Patient Goals at Discharge (Final Review):    ITP Comments:  ITP Comments     Row Name 01/16/22 1536 01/28/22 0928         ITP Comments Initial telephone orientation completed. [facility] EP orientation scheduled for Wednesday 8/9 at 8am. Completed 6MWT and gym orientation. Initial ITP created and sent for review to Dr. MaEmily FilbertMedical Director.               Comments: Initial ITP

## 2022-01-29 DIAGNOSIS — I5022 Chronic systolic (congestive) heart failure: Secondary | ICD-10-CM | POA: Diagnosis not present

## 2022-01-29 NOTE — Progress Notes (Signed)
Daily Session Note  Patient Details  Name: Cody DIPASQUALE Sr. MRN: [id number] Date of Birth: [birth year] Referring Provider:   Flowsheet Row Cardiac Rehab from 01/28/2022 in [facility] Cardiac and Pulmonary Rehab  Referring Provider Lujean Amel MD       Encounter Date: 01/29/2022  Check In:  Session Check In - 01/29/22 1323       Check-In   Supervising physician immediately available to respond to emergencies See telemetry face sheet for immediately available ER MD    Location [facility]-Cardiac & Pulmonary Rehab    Staff Present Vida Rigger, RN, BSN;Leenah Seidner [place], MA, RCEP, CCRP, Marylynn Pearson, MS, ASCM CEP, Exercise Physiologist    Virtual Visit No    Medication changes reported     No    Fall or balance concerns reported    No    Warm-up and Cool-down Performed on first and last piece of equipment    Resistance Training Performed Yes    VAD Patient? No    PAD/SET Patient? No      Pain Assessment   Currently in Pain? No/denies                Social History   Tobacco Use  Smoking Status Never  Smokeless Tobacco Never    Goals Met:  Exercise tolerated well Personal goals reviewed No report of concerns or symptoms today Strength training completed today  Goals Unmet:  Not Applicable  Comments: First full day of exercise!  Patient was oriented to gym and equipment including functions, settings, policies, and procedures.  Patient's individual exercise prescription and treatment plan were reviewed.  All starting workloads were established based on the results of the 6 minute walk test done at initial orientation visit.  The plan for exercise progression was also introduced and progression will be customized based on patient's performance and goals.    Dr. Emily Filbert is Medical Director for [place].  Dr. Ottie Glazier is Medical Director for [facility] Pulmonary Rehabilitation.

## 2022-02-02 ENCOUNTER — Encounter: Payer: PPO | Admitting: *Deleted

## 2022-02-02 DIAGNOSIS — I5022 Chronic systolic (congestive) heart failure: Secondary | ICD-10-CM | POA: Diagnosis not present

## 2022-02-02 NOTE — Progress Notes (Signed)
Daily Session Note  Patient Details  Name: Cody DENN Sr. MRN: [id number] Date of Birth: Dec 17, 1940 Referring Provider:   Flowsheet Row Cardiac Rehab from 01/28/2022 in [facility] Cardiac and Pulmonary Rehab  Referring Provider Lujean Amel MD       Encounter Date: 02/02/2022  Check In:  Session Check In - 02/02/22 1333       Check-In   Supervising physician immediately available to respond to emergencies See telemetry face sheet for immediately available ER MD    Location [facility]-Cardiac & Pulmonary Rehab    Staff Present Hope Budds, RDN, LDN;Angas Isabell Sherryll Burger, RN Margurite Auerbach, MS, ASCM CEP, Exercise Physiologist    Virtual Visit No    Medication changes reported     No    Fall or balance concerns reported    No    Warm-up and Cool-down Performed on first and last piece of equipment    Resistance Training Performed Yes    VAD Patient? No    PAD/SET Patient? No      Pain Assessment   Currently in Pain? No/denies                Social History   Tobacco Use  Smoking Status Never  Smokeless Tobacco Never    Goals Met:  Independence with exercise equipment Exercise tolerated well No report of concerns or symptoms today Strength training completed today  Goals Unmet:  Not Applicable  Comments: Pt able to follow exercise prescription today without complaint.  Will continue to monitor for progression.    Dr. Emily Filbert is Medical Director for [place].  Dr. Ottie Glazier is Medical Director for [facility] Pulmonary Rehabilitation.

## 2022-02-04 ENCOUNTER — Encounter: Payer: PPO | Admitting: *Deleted

## 2022-02-04 DIAGNOSIS — I5022 Chronic systolic (congestive) heart failure: Secondary | ICD-10-CM

## 2022-02-04 NOTE — Progress Notes (Signed)
Daily Session Note  Patient Details  Name: Cody KINNETT Sr. MRN: [id number] Date of Birth: 09-24-1940 Referring Provider:   Flowsheet Row Cardiac Rehab from 01/28/2022 in [facility] Cardiac and Pulmonary Rehab  Referring Provider Lujean Amel MD       Encounter Date: 02/04/2022  Check In:  Session Check In - 02/04/22 1331       Check-In   Supervising physician immediately available to respond to emergencies See telemetry face sheet for immediately available ER MD    Location [facility]-Cardiac & Pulmonary Rehab    Staff Present Renita Papa, RN BSN;Melissa [place], RDN, Tawanna Solo, MS, ASCM CEP, Exercise Physiologist    Virtual Visit No    Medication changes reported     No    Fall or balance concerns reported    No    Warm-up and Cool-down Performed on first and last piece of equipment    Resistance Training Performed Yes    VAD Patient? No    PAD/SET Patient? No      Pain Assessment   Currently in Pain? No/denies                Social History   Tobacco Use  Smoking Status Never  Smokeless Tobacco Never    Goals Met:  Independence with exercise equipment Exercise tolerated well No report of concerns or symptoms today Strength training completed today  Goals Unmet:  Not Applicable  Comments: Pt able to follow exercise prescription today without complaint.  Will continue to monitor for progression.    Dr. Emily Filbert is Medical Director for [place].  Dr. Ottie Glazier is Medical Director for [facility] Pulmonary Rehabilitation.

## 2022-02-05 DIAGNOSIS — I5022 Chronic systolic (congestive) heart failure: Secondary | ICD-10-CM

## 2022-02-05 DIAGNOSIS — I428 Other cardiomyopathies: Secondary | ICD-10-CM | POA: Diagnosis not present

## 2022-02-05 DIAGNOSIS — I493 Ventricular premature depolarization: Secondary | ICD-10-CM | POA: Diagnosis not present

## 2022-02-05 NOTE — Progress Notes (Signed)
Daily Session Note  Patient Details  Name: Cody KAUTH Sr. MRN: [id number] Date of Birth: May 05, 1941 Referring Provider:   Flowsheet Row Cardiac Rehab from 01/28/2022 in [facility] Cardiac and Pulmonary Rehab  Referring Provider Lujean Amel MD       Encounter Date: 02/05/2022  Check In:  Session Check In - 02/05/22 1419       Check-In   Supervising physician immediately available to respond to emergencies See telemetry face sheet for immediately available ER MD    Location [facility]-Cardiac & Pulmonary Rehab    Staff Present Antionette Fairy, BS, Exercise Physiologist;Kahron Kauth, RN, BSN;Jessica Hawkins, MA, RCEP, CCRP, CCET    Virtual Visit No    Medication changes reported     No    Fall or balance concerns reported    No    Warm-up and Cool-down Performed on first and last piece of equipment    Resistance Training Performed Yes    VAD Patient? No    PAD/SET Patient? No      Pain Assessment   Currently in Pain? No/denies                Social History   Tobacco Use  Smoking Status Never  Smokeless Tobacco Never    Goals Met:  Independence with exercise equipment Exercise tolerated well No report of concerns or symptoms today Strength training completed today  Goals Unmet:  Not Applicable  Comments: Pt able to follow exercise prescription today without complaint.  Will continue to monitor for progression.  Reviewed home exercise with pt today.  Pt plans to walk and use weight equipment  at home for exercise.  Reviewed THR, pulse, RPE, sign and symptoms, pulse oximetery and when to call 911 or MD.  Also discussed weather considerations and indoor options.  Pt voiced understanding.   Dr. Emily Filbert is Medical Director for [place].  Dr. Ottie Glazier is Medical Director for [facility] Pulmonary Rehabilitation.

## 2022-02-09 ENCOUNTER — Encounter: Payer: PPO | Admitting: *Deleted

## 2022-02-09 DIAGNOSIS — I5022 Chronic systolic (congestive) heart failure: Secondary | ICD-10-CM | POA: Diagnosis not present

## 2022-02-09 NOTE — Progress Notes (Signed)
Daily Session Note  Patient Details  Name: Cody Pollard. MRN: [id number] Date of Birth: [birth year] Referring Provider:   Flowsheet Row Cardiac Rehab from 01/28/2022 in [facility] Cardiac and Pulmonary Rehab  Referring Provider Lujean Amel MD       Encounter Date: 02/09/2022  Check In:  Session Check In - 02/09/22 1328       Check-In   Supervising physician immediately available to respond to emergencies See telemetry face sheet for immediately available ER MD    Location [facility]-Cardiac & Pulmonary Rehab    Staff Present Justin Mend, RCP,RRT,BSRT;[place] Sherryll Burger, RN BSN;Melissa Caiola, RDN, LDN    Virtual Visit No    Medication changes reported     No    Fall or balance concerns reported    No    Warm-up and Cool-down Performed on first and last piece of equipment    Resistance Training Performed Yes    VAD Patient? No    PAD/SET Patient? No      Pain Assessment   Currently in Pain? No/denies                Social History   Tobacco Use  Smoking Status Never  Smokeless Tobacco Never    Goals Met:  Independence with exercise equipment Exercise tolerated well No report of concerns or symptoms today Strength training completed today  Goals Unmet:  Not Applicable  Comments: Pt able to follow exercise prescription today without complaint.  Will continue to monitor for progression.    Dr. Emily Filbert is Medical Director for [place].  Dr. Ottie Glazier is Medical Director for [facility] Pulmonary Rehabilitation.

## 2022-02-09 NOTE — Progress Notes (Signed)
Completed initial RD consultation ?

## 2022-02-11 ENCOUNTER — Encounter: Payer: PPO | Admitting: *Deleted

## 2022-02-11 DIAGNOSIS — I5022 Chronic systolic (congestive) heart failure: Secondary | ICD-10-CM

## 2022-02-11 NOTE — Progress Notes (Signed)
Daily Session Note  Patient Details  Name: Cody JURY Sr. MRN: [id number] Date of Birth: [birth year] Referring Provider:   Flowsheet Row Cardiac Rehab from 01/28/2022 in [facility] Cardiac and Pulmonary Rehab  Referring Provider Lujean Amel MD       Encounter Date: 02/11/2022  Check In:  Session Check In - 02/11/22 1335       Check-In   Supervising physician immediately available to respond to emergencies See telemetry face sheet for immediately available ER MD    Location [facility]-Cardiac & Pulmonary Rehab    Staff Present Renita Papa, RN BSN;Joseph Tessie Fass, RCP,RRT,BSRT;Melissa [place], [place], [place], BS, Exercise Physiologist    Virtual Visit No    Medication changes reported     No    Fall or balance concerns reported    No    Warm-up and Cool-down Performed on first and last piece of equipment    Resistance Training Performed Yes    VAD Patient? No    PAD/SET Patient? No      Pain Assessment   Currently in Pain? No/denies                Social History   Tobacco Use  Smoking Status Never  Smokeless Tobacco Never    Goals Met:  Independence with exercise equipment Exercise tolerated well No report of concerns or symptoms today Strength training completed today  Goals Unmet:  Not Applicable  Comments: Pt able to follow exercise prescription today without complaint.  Will continue to monitor for progression.    Dr. Emily Filbert is Medical Director for [place].  Dr. Ottie Glazier is Medical Director for [facility] Pulmonary Rehabilitation.

## 2022-02-16 ENCOUNTER — Encounter: Payer: PPO | Admitting: *Deleted

## 2022-02-16 DIAGNOSIS — I5022 Chronic systolic (congestive) heart failure: Secondary | ICD-10-CM

## 2022-02-16 NOTE — Progress Notes (Signed)
Daily Session Note  Patient Details  Name: Cody CUNY Sr. MRN: [id number] Date of Birth: 12/28/1940 Referring Provider:   Flowsheet Row Cardiac Rehab from 01/28/2022 in [facility] Cardiac and Pulmonary Rehab  Referring Provider Lujean Amel MD       Encounter Date: 02/16/2022  Check In:  Session Check In - 02/16/22 1341       Check-In   Supervising physician immediately available to respond to emergencies See telemetry face sheet for immediately available ER MD    Location [facility]-Cardiac & Pulmonary Rehab    Staff Present Renita Papa, RN BSN;Melissa [place] Pillar, RDN, LDN;Joseph Tessie Fass, RCP,RRT,BSRT    Virtual Visit No    Medication changes reported     No    Warm-up and Cool-down Performed on first and last piece of equipment    Resistance Training Performed Yes    VAD Patient? No    PAD/SET Patient? No      Pain Assessment   Currently in Pain? No/denies                Social History   Tobacco Use  Smoking Status Never  Smokeless Tobacco Never    Goals Met:  Independence with exercise equipment Exercise tolerated well No report of concerns or symptoms today Strength training completed today  Goals Unmet:  Not Applicable  Comments: Pt able to follow exercise prescription today without complaint.  Will continue to monitor for progression.   Dr. Emily Filbert is Medical Director for [place].  Dr. Ottie Glazier is Medical Director for [facility] Pulmonary Rehabilitation.

## 2022-02-19 ENCOUNTER — Encounter: Payer: PPO | Admitting: *Deleted

## 2022-02-19 DIAGNOSIS — I5022 Chronic systolic (congestive) heart failure: Secondary | ICD-10-CM | POA: Diagnosis not present

## 2022-02-19 NOTE — Progress Notes (Signed)
Daily Session Note  Patient Details  Name: Cody FLETT Sr. MRN: [id number] Date of Birth: July 23, 1940 Referring Provider:   Flowsheet Row Cardiac Rehab from 01/28/2022 in [facility] Cardiac and Pulmonary Rehab  Referring Provider Lujean Amel MD       Encounter Date: 02/19/2022  Check In:  Session Check In - 02/19/22 1345       Check-In   Supervising physician immediately available to respond to emergencies See telemetry face sheet for immediately available ER MD    Location [facility]-Cardiac & Pulmonary Rehab    Staff Present Renita Papa, RN BSN;Joseph [place], RCP,RRT,BSRT;Jessica [place], [place], RCEP, CCRP, CCET    Virtual Visit No    Medication changes reported     No    Fall or balance concerns reported    No    Warm-up and Cool-down Performed on first and last piece of equipment    Resistance Training Performed Yes    VAD Patient? No    PAD/SET Patient? No      Pain Assessment   Currently in Pain? No/denies                Social History   Tobacco Use  Smoking Status Never  Smokeless Tobacco Never    Goals Met:  Independence with exercise equipment Exercise tolerated well No report of concerns or symptoms today Strength training completed today  Goals Unmet:  Not Applicable  Comments: Pt able to follow exercise prescription today without complaint.  Will continue to monitor for progression.    Dr. Emily Filbert is Medical Director for [place].  Dr. Ottie Glazier is Medical Director for [facility] Pulmonary Rehabilitation.

## 2022-02-25 ENCOUNTER — Encounter: Payer: Self-pay | Admitting: *Deleted

## 2022-02-25 DIAGNOSIS — I5022 Chronic systolic (congestive) heart failure: Secondary | ICD-10-CM

## 2022-02-25 NOTE — Progress Notes (Signed)
Cardiac Individual Treatment Plan  Patient Details  Name: Cody EARNHART Sr. MRN: [id number] Date of Birth: 01/13/1941 Referring Provider:   Flowsheet Row Cardiac Rehab from 01/28/2022 in [facility] Cardiac and Pulmonary Rehab  Referring Provider Lujean Amel MD       Initial Encounter Date:  Flowsheet Row Cardiac Rehab from 01/28/2022 in [facility] Cardiac and Pulmonary Rehab  Date 01/28/22       Visit Diagnosis: Heart failure, chronic systolic (HCC)  Patient's Home Medications on Admission:  Current Outpatient Medications:    amiodarone (PACERONE) 200 MG tablet, Take 0.5 tablets (100 mg total) by mouth daily., Disp: 45 tablet, Rfl: 3   aspirin EC 81 MG tablet, Take 81 mg by mouth daily., Disp: , Rfl:    carvedilol (COREG) 6.25 MG tablet, Take 1 tablet (6.25 mg total) by mouth 2 (two) times daily with a meal. (Patient taking differently: Take 3.125 mg by mouth 2 (two) times daily with a meal.), Disp: 60 tablet, Rfl: 0   dapagliflozin propanediol (FARXIGA) 10 MG TABS tablet, Take 1 tablet (10 mg total) by mouth daily before breakfast., Disp: 90 tablet, Rfl: 3   dutasteride (AVODART) 0.5 MG capsule, Take 0.5 mg by mouth daily., Disp: , Rfl:    furosemide (LASIX) 20 MG tablet, Take 20 mg by mouth daily., Disp: , Rfl:    gabapentin (NEURONTIN) 300 MG capsule, Take 300 mg by mouth 3 (three) times daily., Disp: , Rfl:    rosuvastatin (CRESTOR) 10 MG tablet, Take 10 mg by mouth daily., Disp: , Rfl:    sacubitril-valsartan (ENTRESTO) 24-26 MG, Take 1 tablet by mouth 2 (two) times daily., Disp: 180 tablet, Rfl: 3   spironolactone (ALDACTONE) 25 MG tablet, Take 0.5 tablets (12.5 mg total) by mouth daily., Disp: 15 tablet, Rfl: 0  Past Medical History: Past Medical History:  Diagnosis Date   Arrhythmia    atrial fibrillation   BPH (benign prostatic hyperplasia)    CHF (congestive heart failure) (HCC)    Complication of anesthesia    Edema    MILD FEET/ANKLES OCCAS   History of kidney stones     Hyperlipidemia    Hypertension    Neuromuscular disorder (HCC)    TRIGEMINAL NEURALGIA   Palpitations    PONV (postoperative nausea and vomiting)    Sleep apnea    DOES NOT USE CPAP   Vertigo     Tobacco Use: Social History   Tobacco Use  Smoking Status Never  Smokeless Tobacco Never    Labs: Review Flowsheet        No data to display           Exercise Target Goals: Exercise Program Goal: Individual exercise prescription set using results from initial 6 min walk test and THRR while considering  patient's activity barriers and safety.   Exercise Prescription Goal: Initial exercise prescription builds to 30-45 minutes a day of aerobic activity, 2-3 days per week.  Home exercise guidelines will be given to patient during program as part of exercise prescription that the participant will acknowledge.   Education: Aerobic Exercise: - Group verbal and visual presentation on the components of exercise prescription. Introduces F.I.T.T principle from ACSM for exercise prescriptions.  Reviews F.I.T.T. principles of aerobic exercise including progression. Written material given at graduation. Flowsheet Row Cardiac Rehab from 01/28/2022 in [facility] Cardiac and Pulmonary Rehab  Education need identified 01/28/22       Education: Resistance Exercise: - Group verbal and visual presentation on the components of exercise  prescription. Introduces F.I.T.T principle from ACSM for exercise prescriptions  Reviews F.I.T.T. principles of resistance exercise including progression. Written material given at graduation.    Education: Exercise & Equipment Safety: - Individual verbal instruction and demonstration of equipment use and safety with use of the equipment. Flowsheet Row Cardiac Rehab from 01/28/2022 in [facility] Cardiac and Pulmonary Rehab  Date 01/28/22  Educator [facility]  Instruction Review Code 1- Verbalizes Understanding       Education: Exercise Physiology & General Exercise  Guidelines: - Group verbal and written instruction with models to review the exercise physiology of the cardiovascular system and associated critical values. Provides general exercise guidelines with specific guidelines to those with heart or lung disease.    Education: Flexibility, Balance, Mind/Body Relaxation: - Group verbal and visual presentation with interactive activity on the components of exercise prescription. Introduces F.I.T.T principle from ACSM for exercise prescriptions. Reviews F.I.T.T. principles of flexibility and balance exercise training including progression. Also discusses the mind body connection.  Reviews various relaxation techniques to help reduce and manage stress (i.e. Deep breathing, progressive muscle relaxation, and visualization). Balance handout provided to take home. Written material given at graduation.   Activity Barriers & Risk Stratification:  Activity Barriers & Cardiac Risk Stratification - 01/28/22 0930       Activity Barriers & Cardiac Risk Stratification   Activity Barriers Deconditioning;Muscular Weakness    Cardiac Risk Stratification High             6 Minute Walk:  6 Minute Walk     Row Name 01/28/22 0929         6 Minute Walk   Phase Initial     Distance 1520 feet     Walk Time 6 minutes     # of Rest Breaks 0     MPH 2.88     METS 2.45     RPE 9     VO2 Peak 8.58     Symptoms No     Resting HR 47 bpm     Resting BP 116/60     Resting Oxygen Saturation  97 %     Exercise Oxygen Saturation  during 6 min walk 96 %     Max Ex. HR 67 bpm     Max Ex. BP 134/72     2 Minute Post BP 124/56              Oxygen Initial Assessment:   Oxygen Re-Evaluation:   Oxygen Discharge (Final Oxygen Re-Evaluation):   Initial Exercise Prescription:  Initial Exercise Prescription - 01/28/22 0900       Date of Initial Exercise RX and Referring Provider   Date 01/28/22    Referring Provider Lujean Amel MD      Oxygen    Maintain Oxygen Saturation 88% or higher      Treadmill   MPH 2.8    Grade 1    Minutes 15    METs 3.53      NuStep   Level 3    SPM 80    Minutes 15    METs 2.5      REL-XR   Level 3    Speed 50    Minutes 15    METs 2.5      Track   Laps 40    Minutes 15    METs 3.18      Prescription Details   Frequency (times per week) 3    Duration Progress to 30 minutes  of continuous aerobic without signs/symptoms of physical distress      Intensity   THRR 40-80% of Max Heartrate 84-121    Ratings of Perceived Exertion 11-13    Perceived Dyspnea 0-4      Progression   Progression Continue to progress workloads to maintain intensity without signs/symptoms of physical distress.      Resistance Training   Training Prescription Yes    Weight 4 lb    Reps 10-15             Perform Capillary Blood Glucose checks as needed.  Exercise Prescription Changes:   Exercise Prescription Changes     Row Name 01/28/22 0900 02/03/22 1400 02/05/22 1400 02/16/22 1300       Response to Exercise   Blood Pressure (Admit) 116/60 110/68 -- 124/62    Blood Pressure (Exercise) 134/72 128/68 -- 140/78    Blood Pressure (Exit) 124/56 102/64 -- 122/60    Heart Rate (Admit) 47 bpm 52 bpm -- 52 bpm    Heart Rate (Exercise) 67 bpm 71 bpm -- 98 bpm    Heart Rate (Exit) 47 bpm 56 bpm -- 69 bpm    Oxygen Saturation (Admit) 97 % -- -- --    Oxygen Saturation (Exercise) 96 % -- -- --    Rating of Perceived Exertion (Exercise) 9 11 -- 13    Symptoms none none -- none    Comments walk test results 1st full day of exercise -- --    Duration -- Continue with 30 min of aerobic exercise without signs/symptoms of physical distress. -- Continue with 30 min of aerobic exercise without signs/symptoms of physical distress.    Intensity -- THRR unchanged -- THRR unchanged      Progression   Progression -- Continue to progress workloads to maintain intensity without signs/symptoms of physical distress. --  Continue to progress workloads to maintain intensity without signs/symptoms of physical distress.    Average METs -- 3.15 -- 3.26      Resistance Training   Training Prescription -- Yes -- Yes    Weight -- 4 lb -- 4 lb    Reps -- 10-15 -- 10-15      Interval Training   Interval Training -- No -- No      Treadmill   MPH -- -- -- 3    Grade -- -- -- 1    Minutes -- -- -- 15    METs -- -- -- 3.71      NuStep   Level -- 3 -- 3    Minutes -- 15 -- 15    METs -- 3.4 -- 3.6      REL-XR   Level -- -- -- 3    Minutes -- -- -- 15    METs -- -- -- 3.3      Track   Laps -- 35 -- 68    Minutes -- 15 -- 15    METs -- 2.9 -- 4.7      Home Exercise Plan   Plans to continue exercise at -- -- Home (comment)  walking, weights Home (comment)  walking, weights    Frequency -- -- Add 3 additional days to program exercise sessions. Add 3 additional days to program exercise sessions.    Initial Home Exercises Provided -- -- 02/05/22 02/05/22      Oxygen   Maintain Oxygen Saturation -- 88% or higher -- 88% or higher  Exercise Comments:   Exercise Comments     Row Name 01/29/22 1324           Exercise Comments First full day of exercise!  Patient was oriented to gym and equipment including functions, settings, policies, and procedures.  Patient's individual exercise prescription and treatment plan were reviewed.  All starting workloads were established based on the results of the 6 minute walk test done at initial orientation visit.  The plan for exercise progression was also introduced and progression will be customized based on patient's performance and goals.                Exercise Goals and Review:   Exercise Goals     Row Name 01/28/22 0937             Exercise Goals   Increase Physical Activity Yes       Intervention Provide advice, education, support and counseling about physical activity/exercise needs.;Develop an individualized exercise  prescription for aerobic and resistive training based on initial evaluation findings, risk stratification, comorbidities and participant's personal goals.       Expected Outcomes Short Term: Attend rehab on a regular basis to increase amount of physical activity.;Long Term: Add in home exercise to make exercise part of routine and to increase amount of physical activity.;Long Term: Exercising regularly at least 3-5 days a week.       Increase Strength and Stamina Yes       Intervention Provide advice, education, support and counseling about physical activity/exercise needs.;Develop an individualized exercise prescription for aerobic and resistive training based on initial evaluation findings, risk stratification, comorbidities and participant's personal goals.       Expected Outcomes Short Term: Increase workloads from initial exercise prescription for resistance, speed, and METs.;Short Term: Perform resistance training exercises routinely during rehab and add in resistance training at home;Long Term: Improve cardiorespiratory fitness, muscular endurance and strength as measured by increased METs and functional capacity (6MWT)       Able to understand and use rate of perceived exertion (RPE) scale Yes       Intervention Provide education and explanation on how to use RPE scale       Expected Outcomes Short Term: Able to use RPE daily in rehab to express subjective intensity level;Long Term:  Able to use RPE to guide intensity level when exercising independently       Able to understand and use Dyspnea scale Yes       Intervention Provide education and explanation on how to use Dyspnea scale       Expected Outcomes Long Term: Able to use Dyspnea scale to guide intensity level when exercising independently;Short Term: Able to use Dyspnea scale daily in rehab to express subjective sense of shortness of breath during exertion       Knowledge and understanding of Target Heart Rate Range (THRR) Yes        Intervention Provide education and explanation of THRR including how the numbers were predicted and where they are located for reference       Expected Outcomes Short Term: Able to state/look up THRR;Short Term: Able to use daily as guideline for intensity in rehab;Long Term: Able to use THRR to govern intensity when exercising independently       Able to check pulse independently Yes       Intervention Provide education and demonstration on how to check pulse in carotid and radial arteries.;Review the importance of being able to check your  own pulse for safety during independent exercise       Expected Outcomes Short Term: Able to explain why pulse checking is important during independent exercise;Long Term: Able to check pulse independently and accurately       Understanding of Exercise Prescription Yes       Intervention Provide education, explanation, and written materials on patient's individual exercise prescription       Expected Outcomes Short Term: Able to explain program exercise prescription;Long Term: Able to explain home exercise prescription to exercise independently                Exercise Goals Re-Evaluation :  Exercise Goals Re-Evaluation     Row Name 01/29/22 1324 02/03/22 1428 02/05/22 1425 02/16/22 1355       Exercise Goal Re-Evaluation   Exercise Goals Review Able to understand and use rate of perceived exertion (RPE) scale;Able to understand and use Dyspnea scale;Understanding of Exercise Prescription;Knowledge and understanding of Target Heart Rate Range (THRR) Increase Physical Activity;Increase Strength and Stamina;Understanding of Exercise Prescription Increase Physical Activity;Understanding of Exercise Prescription;Increase Strength and Stamina;Able to understand and use rate of perceived exertion (RPE) scale;Able to understand and use Dyspnea scale;Knowledge and understanding of Target Heart Rate Range (THRR);Able to check pulse independently Increase Physical  Activity;Understanding of Exercise Prescription;Increase Strength and Stamina    Comments Reviewed RPE and dyspnea scales, THR and program prescription with pt today.  Pt voiced understanding and was given a copy of goals to take home. Cody Pollard is off to a good start in rehab. On his first day he had an avergae MET level of 3.15 METs. He also has gotten up to 35 laps on the track and tolerated level 3 on the T4. We will continue to monitor his progress in the program. Cody Pollard is doing well in rehab.  He has not yet done much on his own.  Reviewed home exercise with pt today.  Pt plans to walk and use weight equipment  at home for exercise.  Reviewed THR, pulse, RPE, sign and symptoms, pulse oximetery and when to call 911 or MD.  Also discussed weather considerations and indoor options.  Pt voiced understanding. Cody Pollard continues to do well in rehab. He recently improved his overall MET level to 3.26 METs. He also increased his pace on the track and got 68 laps! Cody Pollard increased his workload on the treadmill as well, to a speed of 3 mph and an incline of 1%. We will continue to monitor his progress in the program.    Expected Outcomes Short: Use RPE daily to regulate intensity. Long: Follow program prescription in THR. Short: Continue to work up laps on the track. Long: Continue to improv overall MET levels. Short: Start to add in exercise at home Long: continue to improve stamina Short: Continue to increase workloads. Long: Continue to improve strength and stamina.             Discharge Exercise Prescription (Final Exercise Prescription Changes):  Exercise Prescription Changes - 02/16/22 1300       Response to Exercise   Blood Pressure (Admit) 124/62    Blood Pressure (Exercise) 140/78    Blood Pressure (Exit) 122/60    Heart Rate (Admit) 52 bpm    Heart Rate (Exercise) 98 bpm    Heart Rate (Exit) 69 bpm    Rating of Perceived Exertion (Exercise) 13    Symptoms none    Duration Continue with 30 min of  aerobic exercise without signs/symptoms of physical distress.  Intensity THRR unchanged      Progression   Progression Continue to progress workloads to maintain intensity without signs/symptoms of physical distress.    Average METs 3.26      Resistance Training   Training Prescription Yes    Weight 4 lb    Reps 10-15      Interval Training   Interval Training No      Treadmill   MPH 3    Grade 1    Minutes 15    METs 3.71      NuStep   Level 3    Minutes 15    METs 3.6      REL-XR   Level 3    Minutes 15    METs 3.3      Track   Laps 68    Minutes 15    METs 4.7      Home Exercise Plan   Plans to continue exercise at Home (comment)   walking, weights   Frequency Add 3 additional days to program exercise sessions.    Initial Home Exercises Provided 02/05/22      Oxygen   Maintain Oxygen Saturation 88% or higher             Nutrition:  Target Goals: Understanding of nutrition guidelines, daily intake of sodium <1512m, cholesterol <2069m calories 30% from fat and 7% or less from saturated fats, daily to have 5 or more servings of fruits and vegetables.  Education: All About Nutrition: -Group instruction provided by verbal, written material, interactive activities, discussions, models, and posters to present general guidelines for heart healthy nutrition including fat, fiber, MyPlate, the role of sodium in heart healthy nutrition, utilization of the nutrition label, and utilization of this knowledge for meal planning. Follow up email sent as well. Written material given at graduation. Flowsheet [facility]  Education need identified 01/28/22       Biometrics:  Pre Biometrics - 01/28/22 0937       Pre Biometrics   Height 5' 11"  (1.803 m)    Weight 201 lb 12.8 oz (91.5 kg)    BMI (Calculated) 28.16    Single Leg Stand 13.7 seconds              Nutrition Therapy Plan and Nutrition  Goals:  Nutrition Therapy & Goals - 02/09/22 1458       Nutrition Therapy   Diet Heart healthy, low Na    Drug/Food Interactions Statins/Certain Fruits    Protein (specify units) 105-110g    Fiber 30 grams    Whole Grain Foods 3 servings    Saturated Fats 16 max. grams    Fruits and Vegetables 8 servings/day    Sodium 2 grams      Personal Nutrition Goals   Nutrition Goal ST: add vegetables or fruit to breakfast, have a half whole wheat sandwich and peanut butter for a snack, practice MyPlate guidelines LT: limit saturated fat < 16g/day, limit added sugar < 36g/day, follow MyPlate guidelines    Comments [age].o. M admitted to cardiac rehab s/p HF, chronic systolic. PMHx includes HTN, pre-diabetes, HLD, CAD, GERD. Relevant medications include farxiga, crestor, furosemide.  PYP Score: 64. Vegetables & Fruits 7/12. Breads, Grains & Cereals 7/12. Red & Processed Meat 10/12. Poultry 2/2. Fish & Shellfish 1/4. Beans, Nuts & Seeds 3/4. Milk & Dairy Foods 6/6. Toppings, Oils, Seasonings & Salt 14/20. Sweets, Snacks & Restaurant Food 6/14. Beverages  8/10.  B: eggs with biscuit, coffee L: pack of nabs D: meat (usually chicken) with vegetables and sometimes brown rice. Drinks: water, 2% milk (2-3 glasses). He reports they use olive oil with cooking and sometimes butter, they do not salt their food; instead using Mrs. Dash and other non-salt seasonings to flavor food. He reports having a small lunch due to work - discussed other more nutrient dense snacks such as 1/2 whole wheat sandwich with peanut butter. Suggested adding fruit or vegetables ot breakfast - he reports liking western omelettes and is excited to try adding in vegetables to his eggs. He reports that his wife keeps him in line and his daughter gave him a heart healthy cookbook which they have started to use. Discussed heart healthy eating.      Intervention Plan   Intervention Prescribe, educate and counsel regarding individualized specific  dietary modifications aiming towards targeted core components such as weight, hypertension, lipid management, diabetes, heart failure and other comorbidities.    Expected Outcomes Short Term Goal: Understand basic principles of dietary content, such as calories, fat, sodium, cholesterol and nutrients.;Short Term Goal: A plan has been developed with personal nutrition goals set during dietitian appointment.;Long Term Goal: Adherence to prescribed nutrition plan.             Nutrition Assessments:  MEDIFICTS Score Key: ?70 Need to make dietary changes  40-70 Heart Healthy Diet ? 40 Therapeutic Level Cholesterol Diet  Flowsheet Row Cardiac Rehab from 01/28/2022 in [facility] Cardiac and Pulmonary Rehab  Picture Your Plate Total Score on Admission 64      Picture Your Plate Scores: <38 Unhealthy dietary pattern with much room for improvement. 41-50 Dietary pattern unlikely to meet recommendations for good health and room for improvement. 51-60 More healthful dietary pattern, with some room for improvement.  >60 Healthy dietary pattern, although there may be some specific behaviors that could be improved.    Nutrition Goals Re-Evaluation:  Nutrition Goals Re-Evaluation     [place] Name 02/05/22 1433             Goals   Nutrition Goal Meet with dietitian       Comment Has appointment scheduled for Monday 8/21 with Melissa.       Expected Outcome Meet with dietitian                Nutrition Goals Discharge (Final Nutrition Goals Re-Evaluation):  Nutrition Goals Re-Evaluation - 02/05/22 1433       Goals   Nutrition Goal Meet with dietitian    Comment Has appointment scheduled for Monday 8/21 with Melissa.    Expected Outcome Meet with dietitian             Psychosocial: Target Goals: Acknowledge presence or absence of significant depression and/or stress, maximize coping skills, provide positive support system. Participant is able to verbalize types and ability to use  techniques and skills needed for reducing stress and depression.   Education: Stress, Anxiety, and Depression - Group verbal and visual presentation to define topics covered.  Reviews how body is impacted by stress, anxiety, and depression.  Also discusses healthy ways to reduce stress and to treat/manage anxiety and depression.  Written material given at graduation.   Education: Sleep Hygiene -Provides group verbal and written instruction about how sleep can affect your health.  Define sleep hygiene, discuss sleep cycles and impact of sleep habits. Review good sleep hygiene tips.    Initial Review & Psychosocial Screening:  Initial Psych  Review & Screening - 01/16/22 1514       Initial Review   Current issues with Current Sleep Concerns      Family Dynamics   Good Support System? Yes   wife; children, church     Barriers   Psychosocial barriers to participate in program There are no identifiable barriers or psychosocial needs.;The patient should benefit from training in stress management and relaxation.      Screening Interventions   Interventions Encouraged to exercise;Provide feedback about the scores to participant;To provide support and resources with identified psychosocial needs    Expected Outcomes Short Term goal: Utilizing psychosocial counselor, staff and physician to assist with identification of specific Stressors or current issues interfering with healing process. Setting desired goal for each stressor or current issue identified.;Long Term Goal: Stressors or current issues are controlled or eliminated.;Short Term goal: Identification and review with participant of any Quality of Life or Depression concerns found by scoring the questionnaire.;Long Term goal: The participant improves quality of Life and PHQ9 Scores as seen by post scores and/or verbalization of changes             Quality of Life Scores:   Quality of Life - 01/28/22 0937       Quality of Life    Select Quality of Life      Quality of Life Scores   Health/Function Pre 23 %    Socioeconomic Pre 27.38 %    Psych/Spiritual Pre 29.14 %    Family Pre 28.8 %    GLOBAL Pre 26.06 %            Scores of 19 and below usually indicate a poorer quality of life in these areas.  A difference of  2-3 points is a clinically meaningful difference.  A difference of 2-3 points in the total score of the Quality of Life Index has been associated with significant improvement in overall quality of life, self-image, physical symptoms, and general health in studies assessing change in quality of life.  PHQ-9: Review Flowsheet       01/28/2022 07/02/2021  Depression screen PHQ 2/9  Decreased Interest 0 0  Down, Depressed, Hopeless 0 0  PHQ - 2 Score 0 0  Altered sleeping 0 -  Tired, decreased energy 2 -  Change in appetite 0 -  Feeling bad or failure about yourself  0 -  Trouble concentrating 0 -  Moving slowly or fidgety/restless 0 -  Suicidal thoughts 0 -  PHQ-9 Score 2 -  Difficult doing work/chores Not difficult at all -   Interpretation of Total Score  Total Score Depression Severity:  1-4 = Minimal depression, 5-9 = Mild depression, 10-14 = Moderate depression, 15-19 = Moderately severe depression, 20-27 = Severe depression   Psychosocial Evaluation and Intervention:  Psychosocial Evaluation - 01/16/22 1530       Psychosocial Evaluation & Interventions   Interventions Encouraged to exercise with the program and follow exercise prescription    Comments Cody Pollard reports feeling well given his current health conditions. He has a great support system that includes his wife, local children, church family and friends. He works part time at [employer] and enjoys his active time there. He and his wife were walking this past spring and he wants to get back to that. His current concern is his sleep because his cpap has not been fitting him well. Some nights he has no problem but  others it is a bothersome. He states  he is working on building up his tolerance. He is excited about coming to the program to work on his stamina and get back to being more active    Expected Outcomes Short: attend cardiac rehab for education and exercise. Long: develop and maintain positive self care habits.    Continue Psychosocial Services  Follow up required by staff             Psychosocial Re-Evaluation:  Psychosocial Re-Evaluation     [place] Name 02/05/22 1430             Psychosocial Re-Evaluation   Current issues with Current Sleep Concerns;None Identified       Comments Cody Pollard is doing well in rehab.  He has a great support system in his wife and kids.  He is also plugged into his church.  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  He loves to share his faith with others.    His biggest stressor is his CPAP.  He was just refitted yesterday and hopes a new mask will help. He says if it was more comfortable it would be better.       Expected Outcomes Short: Try out new CPAP mask Long: Continue to focus on positives.       Interventions Encouraged to attend Cardiac Rehabilitation for the exercise       Continue Psychosocial Services  Follow up required by staff                Psychosocial Discharge (Final Psychosocial Re-Evaluation):  Psychosocial Re-Evaluation - 02/05/22 1430       Psychosocial Re-Evaluation   Current issues with Current Sleep Concerns;None Identified    Comments Cody Pollard is doing well in rehab.  He has a great support system in his wife and kids.  He is also plugged into his church.  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  He loves to share his faith with others.    His biggest stressor is his CPAP.  He was just refitted yesterday and hopes a new mask will help. He says if it was more comfortable it would be better.    Expected Outcomes Short: Try out new CPAP mask Long: Continue to focus on positives.    Interventions Encouraged to attend Cardiac Rehabilitation for  the exercise    Continue Psychosocial Services  Follow up required by staff             Vocational Rehabilitation: Provide vocational rehab assistance to qualifying candidates.   Vocational Rehab Evaluation & Intervention:  Vocational Rehab - 01/16/22 1513       Initial Vocational Rehab Evaluation & Intervention   Assessment shows need for Vocational Rehabilitation No             Education: Education Goals: Education classes will be provided on a variety of topics geared toward better understanding of heart health and risk factor modification. Participant will state understanding/return demonstration of topics presented as noted by education test scores.  Learning Barriers/Preferences:  Learning Barriers/Preferences - 01/16/22 1513       Learning Barriers/Preferences   Learning Barriers None    Learning Preferences Individual Instruction             General Cardiac Education Topics:  AED/CPR: - Group verbal and written instruction with the use of models to demonstrate the basic use of the AED with the basic ABC's of resuscitation.   Anatomy and Cardiac Procedures: - Group verbal and visual presentation  and models provide information about basic cardiac anatomy and function. Reviews the testing methods done to diagnose heart disease and the outcomes of the test results. Describes the treatment choices: Medical Management, Angioplasty, or Coronary Bypass Surgery for treating various heart conditions including Myocardial Infarction, Angina, Valve Disease, and Cardiac Arrhythmias.  Written material given at graduation. Flowsheet Row Cardiac Rehab from 01/28/2022 in [facility] Cardiac and Pulmonary Rehab  Education need identified 01/28/22       Medication Safety: - Group verbal and visual instruction to review commonly prescribed medications for heart and lung disease. Reviews the medication, class of the drug, and side effects. Includes the steps to properly store meds  and maintain the prescription regimen.  Written material given at graduation.   Intimacy: - Group verbal instruction through game format to discuss how heart and lung disease can affect sexual intimacy. Written material given at graduation..   Know Your Numbers and Heart Failure: - Group verbal and visual instruction to discuss disease risk factors for cardiac and pulmonary disease and treatment options.  Reviews associated critical values for Overweight/Obesity, Hypertension, Cholesterol, and Diabetes.  Discusses basics of heart failure: signs/symptoms and treatments.  Introduces Heart Failure Zone chart for action plan for heart failure.  Written material given at graduation.   Infection Prevention: - Provides verbal and written material to individual with discussion of infection control including proper hand washing and proper equipment cleaning during exercise session. Flowsheet Row Cardiac Rehab from 01/28/2022 in [facility] Cardiac and Pulmonary Rehab  Date 01/28/22  Educator [facility]  Instruction Review Code 1- Verbalizes Understanding       Falls Prevention: - Provides verbal and written material to individual with discussion of falls prevention and safety. Flowsheet Row Cardiac Rehab from 01/28/2022 in [facility] Cardiac and Pulmonary Rehab  Date 01/28/22  Educator [facility]  Instruction Review Code 1- Verbalizes Understanding       Other: -Provides group and verbal instruction on various topics (see comments)   Knowledge Questionnaire Score:  Knowledge Questionnaire Score - 01/28/22 9833       Knowledge Questionnaire Score   Pre Score 21/26             Core Components/Risk Factors/Patient Goals at Admission:  Personal Goals and Risk Factors at Admission - 01/28/22 0938       Core Components/Risk Factors/Patient Goals on Admission    Weight Management Yes;Weight Loss    Intervention Weight Management: Develop a combined nutrition and exercise program designed to reach desired  caloric intake, while maintaining appropriate intake of nutrient and fiber, sodium and fats, and appropriate energy expenditure required for the weight goal.;Weight Management: Provide education and appropriate resources to help participant work on and attain dietary goals.    Admit Weight 201 lb 12.8 oz (91.5 kg)    Goal Weight: Short Term 197 lb (89.4 kg)    Goal Weight: Long Term 190 lb (86.2 kg)    Expected Outcomes Long Term: Adherence to nutrition and physical activity/exercise program aimed toward attainment of established weight goal;Short Term: Continue to assess and modify interventions until short term weight is achieved;Weight Loss: Understanding of general recommendations for a balanced deficit meal plan, which promotes 1-2 lb weight loss per week and includes a negative energy balance of [phone] kcal/d;Understanding recommendations for meals to include 15-35% energy as protein, 25-35% energy from fat, 35-60% energy from carbohydrates, less than 256m of dietary cholesterol, 20-35 gm of total fiber daily;Understanding of distribution of calorie intake throughout the day with the consumption  of 4-5 meals/snacks    Heart Failure Yes    Intervention Provide a combined exercise and nutrition program that is supplemented with education, support and counseling about heart failure. Directed toward relieving symptoms such as shortness of breath, decreased exercise tolerance, and extremity edema.    Expected Outcomes Improve functional capacity of life;Short term: Attendance in program 2-3 days a week with increased exercise capacity. Reported lower sodium intake. Reported increased fruit and vegetable intake. Reports medication compliance.;Short term: Daily weights obtained and reported for increase. Utilizing diuretic protocols set by physician.;Long term: Adoption of self-care skills and reduction of barriers for early signs and symptoms recognition and intervention leading to self-care maintenance.     Hypertension Yes    Intervention Provide education on lifestyle modifcations including regular physical activity/exercise, weight management, moderate sodium restriction and increased consumption of fresh fruit, vegetables, and low fat dairy, alcohol moderation, and smoking cessation.;Monitor prescription use compliance.    Expected Outcomes Long Term: Maintenance of blood pressure at goal levels.;Short Term: Continued assessment and intervention until BP is < 140/66m HG in hypertensive participants. < 130/834mHG in hypertensive participants with diabetes, heart failure or chronic kidney disease.    Lipids Yes    Intervention Provide education and support for participant on nutrition & aerobic/resistive exercise along with prescribed medications to achieve LDL <7043mHDL >70m63m  Expected Outcomes Short Term: Participant states understanding of desired cholesterol values and is compliant with medications prescribed. Participant is following exercise prescription and nutrition guidelines.;Long Term: Cholesterol controlled with medications as prescribed, with individualized exercise RX and with personalized nutrition plan. Value goals: LDL < 70mg8mL > 40 mg.             Education:Diabetes - Individual verbal and written instruction to review signs/symptoms of diabetes, desired ranges of glucose level fasting, after meals and with exercise. Acknowledge that pre and post exercise glucose checks will be done for 3 sessions at entry of program.   Core Components/Risk Factors/Patient Goals Review:   Goals and Risk Factor Review     Row Name 02/05/22 1433             Core Components/Risk Factors/Patient Goals Review   Personal Goals Review Weight Management/Obesity;Hypertension;Lipids;Heart Failure       Review Cody Pollard is doing well in rehab.  Cody Pollard's weight is holding steady and starting to feel better.  He has not had any heart failure symptoms.  His blood pressures have been running low on  Entresto and he is talking to doctor about it.  His resting heart rates have also been on the lower side around 40s as well.  We will continue to keep eye on these.       Expected Outcomes Short: Continue to work with doctor about BP symptoms Long: Conitnue to montior risk factors.                Core Components/Risk Factors/Patient Goals at Discharge (Final Review):   Goals and Risk Factor Review - 02/05/22 1433       Core Components/Risk Factors/Patient Goals Review   Personal Goals Review Weight Management/Obesity;Hypertension;Lipids;Heart Failure    Review Cody Pollard is doing well in rehab.  Cody Pollard's weight is holding steady and starting to feel better.  He has not had any heart failure symptoms.  His blood pressures have been running low on Entresto and he is talking to doctor about it.  His resting heart rates have also been on the lower side around 40s as well.  We will continue to keep eye on these.    Expected Outcomes Short: Continue to work with doctor about BP symptoms Long: Conitnue to montior risk factors.             ITP Comments:  ITP Comments     Row Name 01/16/22 1536 01/28/22 0928 01/29/22 1324 02/09/22 1457 02/25/22 1321   ITP Comments Initial telephone orientation completed. Diagnosis can be found in [facility] 6/30. EP orientation scheduled for Wednesday 8/9 at 8am. Completed 6MWT and gym orientation. Initial ITP created and sent for review to Dr. Emily Filbert, Medical Director. First full day of exercise!  Patient was oriented to gym and equipment including functions, settings, policies, and procedures.  Patient's individual exercise prescription and treatment plan were reviewed.  All starting workloads were established based on the results of the 6 minute walk test done at initial orientation visit.  The plan for exercise progression was also introduced and progression will be customized based on patient's performance and goals. Completed initial RD consultation 30 Day review  completed. Medical Director ITP review done, changes made as directed, and signed approval by Medical Director.            Comments:

## 2022-03-02 ENCOUNTER — Encounter: Payer: PPO | Attending: Internal Medicine | Admitting: *Deleted

## 2022-03-02 DIAGNOSIS — Z5189 Encounter for other specified aftercare: Secondary | ICD-10-CM | POA: Insufficient documentation

## 2022-03-02 DIAGNOSIS — I5022 Chronic systolic (congestive) heart failure: Secondary | ICD-10-CM | POA: Diagnosis not present

## 2022-03-02 NOTE — Progress Notes (Signed)
Daily Session Note  Patient Details  Name: Cody VANROSSUM Sr. MRN: [id number] Date of Birth: 03-18-1941 Referring Provider:   Flowsheet Row Cardiac Rehab from 01/28/2022 in [facility] Cardiac and Pulmonary Rehab  Referring Provider Lujean Amel MD       Encounter Date: 03/02/2022  Check In:  Session Check In - 03/02/22 1333       Check-In   Supervising physician immediately available to respond to emergencies See telemetry face sheet for immediately available ER MD    Location [facility]-Cardiac & Pulmonary Rehab    Staff Present Renita Papa, RN BSN;Joseph [place], RCP,RRT,BSRT;Kara [place], [place], ASCM CEP, Exercise Physiologist    Virtual Visit No    Medication changes reported     No    Fall or balance concerns reported    Yes    Comments fell while getting into the pool, no injuries    Warm-up and Cool-down Performed on first and last piece of equipment    Resistance Training Performed Yes    VAD Patient? No    PAD/SET Patient? No      Pain Assessment   Currently in Pain? No/denies                Social History   Tobacco Use  Smoking Status Never  Smokeless Tobacco Never    Goals Met:  Independence with exercise equipment Exercise tolerated well No report of concerns or symptoms today Strength training completed today  Goals Unmet:  Not Applicable  Comments: Pt able to follow exercise prescription today without complaint.  Will continue to monitor for progression.    Dr. Emily Filbert is Medical Director for [place].  Dr. Ottie Glazier is Medical Director for [facility] Pulmonary Rehabilitation.

## 2022-03-03 DIAGNOSIS — I4892 Unspecified atrial flutter: Secondary | ICD-10-CM | POA: Diagnosis not present

## 2022-03-03 DIAGNOSIS — I10 Essential (primary) hypertension: Secondary | ICD-10-CM | POA: Diagnosis not present

## 2022-03-03 DIAGNOSIS — I509 Heart failure, unspecified: Secondary | ICD-10-CM | POA: Diagnosis not present

## 2022-03-03 DIAGNOSIS — I4891 Unspecified atrial fibrillation: Secondary | ICD-10-CM | POA: Diagnosis not present

## 2022-03-03 DIAGNOSIS — I493 Ventricular premature depolarization: Secondary | ICD-10-CM | POA: Diagnosis not present

## 2022-03-03 DIAGNOSIS — I429 Cardiomyopathy, unspecified: Secondary | ICD-10-CM | POA: Diagnosis not present

## 2022-03-03 DIAGNOSIS — I739 Peripheral vascular disease, unspecified: Secondary | ICD-10-CM | POA: Diagnosis not present

## 2022-03-03 DIAGNOSIS — I5022 Chronic systolic (congestive) heart failure: Secondary | ICD-10-CM | POA: Diagnosis not present

## 2022-03-03 DIAGNOSIS — E782 Mixed hyperlipidemia: Secondary | ICD-10-CM | POA: Diagnosis not present

## 2022-03-03 DIAGNOSIS — G4733 Obstructive sleep apnea (adult) (pediatric): Secondary | ICD-10-CM | POA: Diagnosis not present

## 2022-03-04 ENCOUNTER — Encounter: Payer: PPO | Admitting: *Deleted

## 2022-03-04 DIAGNOSIS — I5022 Chronic systolic (congestive) heart failure: Secondary | ICD-10-CM

## 2022-03-04 NOTE — Progress Notes (Signed)
Daily Session Note  Patient Details  Name: Cody PIERPOINT Sr. MRN: [id number] Date of Birth: 06/15/1941 Referring Provider:   Flowsheet Row Cardiac Rehab from 01/28/2022 in [facility] Cardiac and Pulmonary Rehab  Referring Provider Lujean Amel MD       Encounter Date: 03/04/2022  Check In:  Session Check In - 03/04/22 1418       Check-In   Supervising physician immediately available to respond to emergencies See telemetry face sheet for immediately available ER MD    Location [facility]-Cardiac & Pulmonary Rehab    Staff Present Renita Papa, RN BSN;Joseph [place], RCP,RRT,BSRT;Noah [place], [place], Exercise Physiologist    Virtual Visit No    Medication changes reported     No    Fall or balance concerns reported    No    Warm-up and Cool-down Performed on first and last piece of equipment    Resistance Training Performed Yes    VAD Patient? No    PAD/SET Patient? No      Pain Assessment   Currently in Pain? No/denies                Social History   Tobacco Use  Smoking Status Never  Smokeless Tobacco Never    Goals Met:  Independence with exercise equipment Exercise tolerated well No report of concerns or symptoms today Strength training completed today  Goals Unmet:  Not Applicable  Comments: Pt able to follow exercise prescription today without complaint.  Will continue to monitor for progression.    Dr. Emily Filbert is Medical Director for [place].  Dr. Ottie Glazier is Medical Director for [facility] Pulmonary Rehabilitation.

## 2022-03-05 ENCOUNTER — Encounter: Payer: PPO | Admitting: *Deleted

## 2022-03-05 DIAGNOSIS — I5022 Chronic systolic (congestive) heart failure: Secondary | ICD-10-CM | POA: Diagnosis not present

## 2022-03-05 NOTE — Progress Notes (Signed)
Daily Session Note  Patient Details  Name: Cody A Gortney Sr. MRN: [id number] Date of Birth: 10/12/1940 Referring Provider:   Flowsheet Row Cardiac Rehab from 01/28/2022 in [facility] Cardiac and Pulmonary Rehab  Referring Provider [place], Dwayne MD       Encounter Date: 03/05/2022  Check In:  Session Check In - 03/05/22 1343       Check-In   Supervising physician immediately available to respond to emergencies See telemetry face sheet for immediately available ER MD    Location [facility]-Cardiac & Pulmonary Rehab    Staff Present Meredith Craven, RN BSN;Joseph [place], RCP,RRT,BSRT;Noah Tickle, BS, Exercise Physiologist    Virtual Visit No    Medication changes reported     Yes    Comments stopped spirinolactone and amiodarone    Fall or balance concerns reported    No    Warm-up and Cool-down Performed on first and last piece of equipment    Resistance Training Performed Yes    VAD Patient? No    PAD/SET Patient? No      Pain Assessment   Currently in Pain? No/denies                Social History   Tobacco Use  Smoking Status Never  Smokeless Tobacco Never    Goals Met:  Independence with exercise equipment Exercise tolerated well No report of concerns or symptoms today Strength training completed today  Goals Unmet:  Not Applicable  Comments: Pt able to follow exercise prescription today without complaint.  Will continue to monitor for progression.    Dr. Mark Miller is Medical Director for [facility].  Dr. Fuad Aleskerov is Medical Director for [facility] Pulmonary Rehabilitation. 

## 2022-03-09 ENCOUNTER — Encounter: Payer: PPO | Admitting: *Deleted

## 2022-03-09 DIAGNOSIS — I5022 Chronic systolic (congestive) heart failure: Secondary | ICD-10-CM

## 2022-03-09 NOTE — Progress Notes (Signed)
Daily Session Note  Patient Details  Name: Cody Pollard. MRN: [id number] Date of Birth: [birth year] Referring Provider:   Flowsheet Row Cardiac Rehab from 01/28/2022 in [facility] Cardiac and Pulmonary Rehab  Referring Provider Lujean Amel MD       Encounter Date: 03/09/2022  Check In:  Session Check In - 03/09/22 1354       Check-In   Supervising physician immediately available to respond to emergencies See telemetry face sheet for immediately available ER MD    Location [facility]-Cardiac & Pulmonary Rehab    Staff Present Renita Papa, RN Moises Blood, BS, ACSM CEP, Exercise Physiologist;Joseph Tessie [place], [place]    Virtual Visit No    Medication changes reported     No    Fall or balance concerns reported    No    Warm-up and Cool-down Performed on first and last piece of equipment    Resistance Training Performed Yes    VAD Patient? No    PAD/SET Patient? No      Pain Assessment   Currently in Pain? No/denies                Social History   Tobacco Use  Smoking Status Never  Smokeless Tobacco Never    Goals Met:  Independence with exercise equipment Exercise tolerated well No report of concerns or symptoms today Strength training completed today  Goals Unmet:  Not Applicable  Comments: Pt able to follow exercise prescription today without complaint.  Will continue to monitor for progression.    Dr. Emily Filbert is Medical Director for [place].  Dr. Ottie Glazier is Medical Director for [facility] Pulmonary Rehabilitation.

## 2022-03-11 ENCOUNTER — Encounter: Payer: PPO | Admitting: *Deleted

## 2022-03-11 DIAGNOSIS — I5022 Chronic systolic (congestive) heart failure: Secondary | ICD-10-CM | POA: Diagnosis not present

## 2022-03-11 NOTE — Progress Notes (Signed)
Daily Session Note  Patient Details  Name: Cody VERONICA Sr. MRN: [id number] Date of Birth: 1940/12/20 Referring Provider:   Flowsheet Row Cardiac Rehab from 01/28/2022 in [facility] Cardiac and Pulmonary Rehab  Referring Provider Lujean Amel MD       Encounter Date: 03/11/2022  Check In:  Session Check In - 03/11/22 1340       Check-In   Supervising physician immediately available to respond to emergencies See telemetry face sheet for immediately available ER MD    Location [facility]-Cardiac & Pulmonary Rehab    Staff Present Renita Papa, RN BSN;Joseph [place], RCP,RRT,BSRT;Laureen [place], [place], RRT, CPFT    Virtual Visit No    Medication changes reported     No    Fall or balance concerns reported    No    Warm-up and Cool-down Performed on first and last piece of equipment    Resistance Training Performed Yes    VAD Patient? No    PAD/SET Patient? No      Pain Assessment   Currently in Pain? No/denies                Social History   Tobacco Use  Smoking Status Never  Smokeless Tobacco Never    Goals Met:  Independence with exercise equipment Exercise tolerated well No report of concerns or symptoms today Strength training completed today  Goals Unmet:  Not Applicable  Comments: Pt able to follow exercise prescription today without complaint.  Will continue to monitor for progression.    Dr. Emily Filbert is Medical Director for [place].  Dr. Ottie Glazier is Medical Director for [facility] Pulmonary Rehabilitation.

## 2022-03-12 ENCOUNTER — Encounter: Payer: PPO | Admitting: *Deleted

## 2022-03-12 DIAGNOSIS — I5022 Chronic systolic (congestive) heart failure: Secondary | ICD-10-CM

## 2022-03-12 NOTE — Progress Notes (Signed)
Daily Session Note  Patient Details  Name: Cody A Lamar Sr. MRN: [id number] Date of Birth: 01/23/1941 Referring Provider:   Flowsheet Row Cardiac Rehab from 01/28/2022 in [facility] Cardiac and Pulmonary Rehab  Referring Provider [place], Dwayne MD       Encounter Date: 03/12/2022  Check In:  Session Check In - 03/12/22 1415       Check-In   Supervising physician immediately available to respond to emergencies See telemetry face sheet for immediately available ER MD    Location [facility]-Cardiac & Pulmonary Rehab    Staff Present Meredith Craven, RN BSN;Joseph [place], RCP,RRT,BSRT;Jessica [place], MA, RCEP, CCRP, CCET    Virtual Visit No    Medication changes reported     No    Fall or balance concerns reported    No    Warm-up and Cool-down Performed on first and last piece of equipment    Resistance Training Performed Yes    VAD Patient? No    PAD/SET Patient? No      Pain Assessment   Currently in Pain? No/denies                Social History   Tobacco Use  Smoking Status Never  Smokeless Tobacco Never    Goals Met:  Independence with exercise equipment Exercise tolerated well No report of concerns or symptoms today Strength training completed today  Goals Unmet:  Not Applicable  Comments: Pt able to follow exercise prescription today without complaint.  Will continue to monitor for progression.    Dr. Mark Miller is Medical Director for [facility].  Dr. Fuad Aleskerov is Medical Director for [facility] Pulmonary [facility]. 

## 2022-03-16 ENCOUNTER — Encounter: Payer: PPO | Admitting: *Deleted

## 2022-03-16 DIAGNOSIS — I5022 Chronic systolic (congestive) heart failure: Secondary | ICD-10-CM

## 2022-03-16 NOTE — Progress Notes (Signed)
Daily Session Note  Patient Details  Name: Cody WITHROW Sr. MRN: [id number] Date of Birth: December 24, 1940 Referring Provider:   Flowsheet Row Cardiac Rehab from 01/28/2022 in [facility] Cardiac and Pulmonary Rehab  Referring Provider Lujean Amel MD       Encounter Date: 03/16/2022  Check In:  Session Check In - 03/16/22 1344       Check-In   Supervising physician immediately available to respond to emergencies See telemetry face sheet for immediately available ER MD    Location [facility]-Cardiac & Pulmonary Rehab    Staff Present Renita Papa, RN BSN;Joseph [place], RCP,RRT,BSRT;Kara [place], [place], ASCM CEP, Exercise Physiologist    Virtual Visit No    Medication changes reported     No    Fall or balance concerns reported    No    Warm-up and Cool-down Performed on first and last piece of equipment    Resistance Training Performed Yes    VAD Patient? No    PAD/SET Patient? No      Pain Assessment   Currently in Pain? No/denies                Social History   Tobacco Use  Smoking Status Never  Smokeless Tobacco Never    Goals Met:  Independence with exercise equipment Exercise tolerated well No report of concerns or symptoms today Strength training completed today  Goals Unmet:  Not Applicable  Comments: Pt able to follow exercise prescription today without complaint.  Will continue to monitor for progression.    Dr. Emily Filbert is Medical Director for [place].  Dr. Ottie Glazier is Medical Director for [facility] Pulmonary Rehabilitation.

## 2022-03-18 ENCOUNTER — Encounter: Payer: PPO | Admitting: *Deleted

## 2022-03-18 DIAGNOSIS — I5022 Chronic systolic (congestive) heart failure: Secondary | ICD-10-CM | POA: Diagnosis not present

## 2022-03-18 NOTE — Progress Notes (Signed)
Daily Session Note  Patient Details  Name: Cody HOMAN Sr. MRN: [id number] Date of Birth: July 03, 1940 Referring Provider:   Flowsheet Row Cardiac Rehab from 01/28/2022 in [facility] Cardiac and Pulmonary Rehab  Referring Provider Lujean Amel MD       Encounter Date: 03/18/2022  Check In:  Session Check In - 03/18/22 1416       Check-In   Supervising physician immediately available to respond to emergencies See telemetry face sheet for immediately available ER MD    Location [facility]-Cardiac & Pulmonary Rehab    Staff Present Coralie Keens, MS, ASCM CEP, Exercise Physiologist;Joseph Rosebud Poles, RN, [place]    Virtual Visit No    Medication changes reported     No    Fall or balance concerns reported    No    Warm-up and Cool-down Performed on first and last piece of equipment    Resistance Training Performed Yes    VAD Patient? No    PAD/SET Patient? No      Pain Assessment   Currently in Pain? No/denies                Social History   Tobacco Use  Smoking Status Never  Smokeless Tobacco Never    Goals Met:  Independence with exercise equipment Exercise tolerated well No report of concerns or symptoms today Strength training completed today  Goals Unmet:  Not Applicable  Comments: Pt able to follow exercise prescription today without complaint.  Will continue to monitor for progression.    Dr. Emily Filbert is Medical Director for [facility].  Dr. Ottie Glazier is Medical Director for [facility].

## 2022-03-19 ENCOUNTER — Encounter: Payer: PPO | Admitting: *Deleted

## 2022-03-19 DIAGNOSIS — I5022 Chronic systolic (congestive) heart failure: Secondary | ICD-10-CM | POA: Diagnosis not present

## 2022-03-19 NOTE — Progress Notes (Signed)
Daily Session Note  Patient Details  Name: DANDRA VELARDI Sr. MRN: [id number] Date of Birth: 1941-03-22 Referring Provider:   Flowsheet Row Cardiac Rehab from 01/28/2022 in [facility] Cardiac and Pulmonary Rehab  Referring Provider Lujean Amel MD       Encounter Date: 03/19/2022  Check In:  Session Check In - 03/19/22 1344       Check-In   Supervising physician immediately available to respond to emergencies See telemetry face sheet for immediately available ER MD    Location [facility]-Cardiac & Pulmonary Rehab    Staff Present Justin Mend, Lorre Nick, MA, RCEP, CCRP, CCET;Nikisha Fleece Sherryll Burger, RN BSN    Virtual Visit No    Medication changes reported     No    Fall or balance concerns reported    No    Warm-up and Cool-down Performed on first and last piece of equipment    Resistance Training Performed Yes    VAD Patient? No    PAD/SET Patient? No      Pain Assessment   Currently in Pain? No/denies                Social History   Tobacco Use  Smoking Status Never  Smokeless Tobacco Never    Goals Met:  Independence with exercise equipment Exercise tolerated well No report of concerns or symptoms today Strength training completed today  Goals Unmet:  Not Applicable  Comments: Pt able to follow exercise prescription today without complaint.  Will continue to monitor for progression.    Dr. Emily Filbert is Medical Director for [facility].  Dr. Ottie Glazier is Medical Director for [facility] Pulmonary Rehabilitation.

## 2022-03-23 ENCOUNTER — Encounter: Payer: PPO | Attending: Internal Medicine | Admitting: *Deleted

## 2022-03-23 DIAGNOSIS — I5022 Chronic systolic (congestive) heart failure: Secondary | ICD-10-CM | POA: Insufficient documentation

## 2022-03-23 DIAGNOSIS — Z48812 Encounter for surgical aftercare following surgery on the circulatory system: Secondary | ICD-10-CM | POA: Diagnosis not present

## 2022-03-23 NOTE — Progress Notes (Signed)
Daily Session Note  Patient Details  Name: Cody A Pulido Sr. MRN: [id number] Date of Birth: 09/18/1940 Referring Provider:   Flowsheet Row Cardiac Rehab from 01/28/2022 in [facility] Cardiac and Pulmonary Rehab  Referring Provider Callwood, Dwayne MD       Encounter Date: 03/23/2022  Check In:  Session Check In - 03/23/22 1350       Check-In   Supervising physician immediately available to respond to emergencies See telemetry face sheet for immediately available ER MD    Location [facility]-Cardiac & Pulmonary Rehab    Staff Present Joseph Hood, RCP,RRT,BSRT;Kara Langdon, MS, ASCM CEP, Exercise Physiologist;Meredith Craven, RN BSN    Virtual Visit No    Medication changes reported     No    Fall or balance concerns reported    No    Warm-up and Cool-down Performed on first and last piece of equipment    Resistance Training Performed Yes    VAD Patient? No    PAD/SET Patient? No      Pain Assessment   Currently in Pain? No/denies                Social History   Tobacco Use  Smoking Status Never  Smokeless Tobacco Never    Goals Met:  Independence with exercise equipment Exercise tolerated well No report of concerns or symptoms today Strength training completed today  Goals Unmet:  Not Applicable  Comments: Pt able to follow exercise prescription today without complaint.  Will continue to monitor for progression.    Dr. Mark Miller is Medical Director for [facility].  Dr. Fuad Aleskerov is Medical Director for [facility]. 

## 2022-03-25 ENCOUNTER — Encounter: Payer: Self-pay | Admitting: *Deleted

## 2022-03-25 ENCOUNTER — Encounter: Payer: PPO | Admitting: *Deleted

## 2022-03-25 VITALS — Ht 71.0 in | Wt 201.7 lb

## 2022-03-25 DIAGNOSIS — I5022 Chronic systolic (congestive) heart failure: Secondary | ICD-10-CM

## 2022-03-25 NOTE — Progress Notes (Signed)
Discharge Summary Cody Pollard DOB 03/16/1941 Cody Pollard graduated today from  rehab with 21 sessions completed.  Details of the patient's exercise prescription and what He needs to do in order to continue the prescription and progress were discussed with patient.  Patient was given a copy of prescription and goals.  Patient verbalized understanding.  Cody Pollard plans to continue to exercise by walking.   [place] Name 01/28/22 0929 03/25/22 1347       6 Minute Walk   Phase Initial Discharge    Distance 1520 feet 1660 feet    Distance % Change -- 9.2 %    Distance Feet Change -- 140 ft    Walk Time 6 minutes 6 minutes    # of Rest Breaks 0 0    MPH 2.88 3.14    METS 2.45 2.8    RPE 9 11    VO2 Peak 8.58 9.79    Symptoms No No    Resting HR 47 bpm 52 bpm    Resting BP 116/60 130/66    Resting Oxygen Saturation  97 % 97 %    Exercise Oxygen Saturation  during 6 min walk 96 % 94 %    Max Ex. HR 67 bpm 75 bpm    Max Ex. BP 134/72 138/64    2 Minute Post BP 124/56 --

## 2022-03-25 NOTE — Progress Notes (Signed)
Cardiac Individual Treatment Plan  Patient Details  Name: Cody BANGHART Sr. MRN: [id number] Date of Birth: [birth year] Referring Provider:   Flowsheet Row Cardiac Rehab from 01/28/2022 in [facility] Cardiac and Pulmonary Rehab  Referring Provider Lujean Amel MD       Initial Encounter Date:  Flowsheet Row Cardiac Rehab from 01/28/2022 in [facility] Cardiac and Pulmonary Rehab  Date 01/28/22       Visit Diagnosis: Heart failure, chronic systolic (HCC)  Patient's Home Medications on Admission:  Current Outpatient Medications:    amiodarone (PACERONE) 200 MG tablet, Take 0.5 tablets (100 mg total) by mouth daily., Disp: 45 tablet, Rfl: 3   aspirin EC 81 MG tablet, Take 81 mg by mouth daily., Disp: , Rfl:    carvedilol (COREG) 6.25 MG tablet, Take 1 tablet (6.25 mg total) by mouth 2 (two) times daily with a meal. (Patient taking differently: Take 3.125 mg by mouth 2 (two) times daily with a meal.), Disp: 60 tablet, Rfl: 0   dapagliflozin propanediol (FARXIGA) 10 MG TABS tablet, Take 1 tablet (10 mg total) by mouth daily before breakfast., Disp: 90 tablet, Rfl: 3   dutasteride (AVODART) 0.5 MG capsule, Take 0.5 mg by mouth daily., Disp: , Rfl:    furosemide (LASIX) 20 MG tablet, Take 20 mg by mouth daily., Disp: , Rfl:    gabapentin (NEURONTIN) 300 MG capsule, Take 300 mg by mouth 3 (three) times daily., Disp: , Rfl:    rosuvastatin (CRESTOR) 10 MG tablet, Take 10 mg by mouth daily., Disp: , Rfl:    sacubitril-valsartan (ENTRESTO) 24-26 MG, Take 1 tablet by mouth 2 (two) times daily., Disp: 180 tablet, Rfl: 3   spironolactone (ALDACTONE) 25 MG tablet, Take 0.5 tablets (12.5 mg total) by mouth daily., Disp: 15 tablet, Rfl: 0  Past Medical History: Past Medical History:  Diagnosis Date   Arrhythmia    atrial fibrillation   BPH (benign prostatic hyperplasia)    CHF (congestive heart failure) (HCC)    Complication of anesthesia    Edema    MILD FEET/ANKLES OCCAS   History of kidney stones     Hyperlipidemia    Hypertension    Neuromuscular disorder (HCC)    TRIGEMINAL NEURALGIA   Palpitations    PONV (postoperative nausea and vomiting)    Sleep apnea    DOES NOT USE CPAP   Vertigo     Tobacco Use: Social History   Tobacco Use  Smoking Status Never  Smokeless Tobacco Never    Labs: Review Flowsheet        No data to display           Exercise Target Goals: Exercise Program Goal: Individual exercise prescription set using results from initial 6 min walk test and THRR while considering  patient's activity barriers and safety.   Exercise Prescription Goal: Initial exercise prescription builds to 30-45 minutes a day of aerobic activity, 2-3 days per week.  Home exercise guidelines will be given to patient during program as part of exercise prescription that the participant will acknowledge.   Education: Aerobic Exercise: - Group verbal and visual presentation on the components of exercise prescription. Introduces F.I.T.T principle from ACSM for exercise prescriptions.  Reviews F.I.T.T. principles of aerobic exercise including progression. Written material given at graduation. Flowsheet Row Cardiac Rehab from 03/18/2022 in [facility] Cardiac and Pulmonary Rehab  Education need identified 01/28/22       Education: Resistance Exercise: - Group verbal and visual presentation on the components of exercise  prescription. Introduces F.I.T.T principle from ACSM for exercise prescriptions  Reviews F.I.T.T. principles of resistance exercise including progression. Written material given at graduation.    Education: Exercise & Equipment Safety: - Individual verbal instruction and demonstration of equipment use and safety with use of the equipment. Flowsheet Row Cardiac Rehab from 03/18/2022 in [facility] Cardiac and Pulmonary Rehab  Date 01/28/22  Educator [facility]  Instruction Review Code 1- Verbalizes Understanding       Education: Exercise Physiology & General Exercise  Guidelines: - Group verbal and written instruction with models to review the exercise physiology of the cardiovascular system and associated critical values. Provides general exercise guidelines with specific guidelines to those with heart or lung disease.    Education: Flexibility, Balance, Mind/Body Relaxation: - Group verbal and visual presentation with interactive activity on the components of exercise prescription. Introduces F.I.T.T principle from ACSM for exercise prescriptions. Reviews F.I.T.T. principles of flexibility and balance exercise training including progression. Also discusses the mind body connection.  Reviews various relaxation techniques to help reduce and manage stress (i.e. Deep breathing, progressive muscle relaxation, and visualization). Balance handout provided to take home. Written material given at graduation.   Activity Barriers & Risk Stratification:  Activity Barriers & Cardiac Risk Stratification - 01/28/22 0930       Activity Barriers & Cardiac Risk Stratification   Activity Barriers Deconditioning;Muscular Weakness    Cardiac Risk Stratification High             6 Minute Walk:  6 Minute Walk     Row Name 01/28/22 0929 03/25/22 1347       6 Minute Walk   Phase Initial Discharge    Distance 1520 feet 1660 feet    Distance % Change -- 9.2 %    Distance Feet Change -- 140 ft    Walk Time 6 minutes 6 minutes    # of Rest Breaks 0 0    MPH 2.88 3.14    METS 2.45 2.8    RPE 9 11    VO2 Peak 8.58 9.79    Symptoms No No    Resting HR 47 bpm 52 bpm    Resting BP 116/60 130/66    Resting Oxygen Saturation  97 % 97 %    Exercise Oxygen Saturation  during 6 min walk 96 % 94 %    Max Ex. HR 67 bpm 75 bpm    Max Ex. BP 134/72 138/64    2 Minute Post BP 124/56 --             Oxygen Initial Assessment:   Oxygen Re-Evaluation:   Oxygen Discharge (Final Oxygen Re-Evaluation):   Initial Exercise Prescription:  Initial Exercise Prescription  - 01/28/22 0900       Date of Initial Exercise RX and Referring Provider   Date 01/28/22    Referring Provider Lujean Amel MD      Oxygen   Maintain Oxygen Saturation 88% or higher      Treadmill   MPH 2.8    Grade 1    Minutes 15    METs 3.53      NuStep   Level 3    SPM 80    Minutes 15    METs 2.5      REL-XR   Level 3    Speed 50    Minutes 15    METs 2.5      Track   Laps 40    Minutes 15  METs 3.18      Prescription Details   Frequency (times per week) 3    Duration Progress to 30 minutes of continuous aerobic without signs/symptoms of physical distress      Intensity   THRR 40-80% of Max Heartrate 84-121    Ratings of Perceived Exertion 11-13    Perceived Dyspnea 0-4      Progression   Progression Continue to progress workloads to maintain intensity without signs/symptoms of physical distress.      Resistance Training   Training Prescription Yes    Weight 4 lb    Reps 10-15             Perform Capillary Blood Glucose checks as needed.  Exercise Prescription Changes:   Exercise Prescription Changes     Row Name 01/28/22 0900 02/03/22 1400 02/05/22 1400 02/16/22 1300 03/03/22 1300     Response to Exercise   Blood Pressure (Admit) 116/60 110/68 -- 124/62 126/62   Blood Pressure (Exercise) 134/72 128/68 -- 140/78 132/68   Blood Pressure (Exit) 124/56 102/64 -- 122/60 122/62   Heart Rate (Admit) 47 bpm 52 bpm -- 52 bpm 52 bpm   Heart Rate (Exercise) 67 bpm 71 bpm -- 98 bpm 68 bpm   Heart Rate (Exit) 47 bpm 56 bpm -- 69 bpm 55 bpm   Oxygen Saturation (Admit) 97 % -- -- -- --   Oxygen Saturation (Exercise) 96 % -- -- -- --   Rating of Perceived Exertion (Exercise) 9 11 -- 13 13   Symptoms none none -- none none   Comments walk test results 1st full day of exercise -- -- --   Duration -- Continue with 30 min of aerobic exercise without signs/symptoms of physical distress. -- Continue with 30 min of aerobic exercise without  signs/symptoms of physical distress. Continue with 30 min of aerobic exercise without signs/symptoms of physical distress.   Intensity -- THRR unchanged -- THRR unchanged THRR unchanged     Progression   Progression -- Continue to progress workloads to maintain intensity without signs/symptoms of physical distress. -- Continue to progress workloads to maintain intensity without signs/symptoms of physical distress. Continue to progress workloads to maintain intensity without signs/symptoms of physical distress.   Average METs -- 3.15 -- 3.26 3.09     Resistance Training   Training Prescription -- Yes -- Yes Yes   Weight -- 4 lb -- 4 lb 4 lb   Reps -- 10-15 -- 10-15 10-15     Interval Training   Interval Training -- No -- No No     Treadmill   MPH -- -- -- 3 3   Grade -- -- -- 1 1   Minutes -- -- -- 15 15   METs -- -- -- 3.71 3.71     NuStep   Level -- 3 -- 3 --   Minutes -- 15 -- 15 --   METs -- 3.4 -- 3.6 --     Recumbant Elliptical   Level -- -- -- -- 2   Minutes -- -- -- -- 15   METs -- -- -- -- 2.1     REL-XR   Level -- -- -- 3 3   Minutes -- -- -- 15 15   METs -- -- -- 3.3 3.3     Track   Laps -- 35 -- 68 90   Minutes -- 15 -- 15 30   METs -- 2.9 -- 4.7 3.71     Home Exercise  Plan   Plans to continue exercise at -- -- Home (comment)  walking, weights Home (comment)  walking, weights Home (comment)  walking, weights   Frequency -- -- Add 3 additional days to program exercise sessions. Add 3 additional days to program exercise sessions. Add 3 additional days to program exercise sessions.   Initial Home Exercises Provided -- -- 02/05/22 02/05/22 02/05/22     Oxygen   Maintain Oxygen Saturation -- 88% or higher -- 88% or higher 88% or higher    Row Name 03/17/22 1500             Response to Exercise   Blood Pressure (Admit) 106/60       Blood Pressure (Exit) 124/64       Heart Rate (Admit) 51 bpm       Heart Rate (Exercise) 100 bpm       Heart Rate (Exit)  57 bpm       Rating of Perceived Exertion (Exercise) 13       Symptoms none       Duration Continue with 30 min of aerobic exercise without signs/symptoms of physical distress.       Intensity THRR unchanged         Progression   Progression Continue to progress workloads to maintain intensity without signs/symptoms of physical distress.       Average METs 3.49         Resistance Training   Training Prescription Yes       Weight 4 lb       Reps 10-15         Interval Training   Interval Training No         Treadmill   MPH 3       Grade 3       Minutes 15       METs 4.54         NuStep   Level 3       Minutes 15       METs 3.3         Recumbant Elliptical   Level 3       Minutes 15       METs 3.3         Track   Laps 90       Minutes 30       METs 3.71         Home Exercise Plan   Plans to continue exercise at Home (comment)  walking, weights       Frequency Add 3 additional days to program exercise sessions.       Initial Home Exercises Provided 02/05/22         Oxygen   Maintain Oxygen Saturation 88% or higher                Exercise Comments:   Exercise Comments     Row Name 01/29/22 1324           Exercise Comments First full day of exercise!  Patient was oriented to gym and equipment including functions, settings, policies, and procedures.  Patient's individual exercise prescription and treatment plan were reviewed.  All starting workloads were established based on the results of the 6 minute walk test done at initial orientation visit.  The plan for exercise progression was also introduced and progression will be customized based on patient's performance and goals.  Exercise Goals and Review:   Exercise Goals     Row Name 01/28/22 0937             Exercise Goals   Increase Physical Activity Yes       Intervention Provide advice, education, support and counseling about physical activity/exercise needs.;Develop an  individualized exercise prescription for aerobic and resistive training based on initial evaluation findings, risk stratification, comorbidities and participant's personal goals.       Expected Outcomes Short Term: Attend rehab on a regular basis to increase amount of physical activity.;Long Term: Add in home exercise to make exercise part of routine and to increase amount of physical activity.;Long Term: Exercising regularly at least 3-5 days a week.       Increase Strength and Stamina Yes       Intervention Provide advice, education, support and counseling about physical activity/exercise needs.;Develop an individualized exercise prescription for aerobic and resistive training based on initial evaluation findings, risk stratification, comorbidities and participant's personal goals.       Expected Outcomes Short Term: Increase workloads from initial exercise prescription for resistance, speed, and METs.;Short Term: Perform resistance training exercises routinely during rehab and add in resistance training at home;Long Term: Improve cardiorespiratory fitness, muscular endurance and strength as measured by increased METs and functional capacity ( )       Able to understand and use rate of perceived exertion (RPE) scale Yes       Intervention Provide education and explanation on how to use RPE scale       Expected Outcomes Short Term: Able to use RPE daily in rehab to express subjective intensity level;Long Term:  Able to use RPE to guide intensity level when exercising independently       Able to understand and use Dyspnea scale Yes       Intervention Provide education and explanation on how to use Dyspnea scale       Expected Outcomes Long Term: Able to use Dyspnea scale to guide intensity level when exercising independently;Short Term: Able to use Dyspnea scale daily in rehab to express subjective sense of shortness of breath during exertion       Knowledge and understanding of Target Heart Rate Range  (THRR) Yes       Intervention Provide education and explanation of THRR including how the numbers were predicted and where they are located for reference       Expected Outcomes Short Term: Able to state/look up THRR;Short Term: Able to use daily as guideline for intensity in rehab;Long Term: Able to use THRR to govern intensity when exercising independently       Able to check pulse independently Yes       Intervention Provide education and demonstration on how to check pulse in carotid and radial arteries.;Review the importance of being able to check your own pulse for safety during independent exercise       Expected Outcomes Short Term: Able to explain why pulse checking is important during independent exercise;Long Term: Able to check pulse independently and accurately       Understanding of Exercise Prescription Yes       Intervention Provide education, explanation, and written materials on patient's individual exercise prescription       Expected Outcomes Short Term: Able to explain program exercise prescription;Long Term: Able to explain home exercise prescription to exercise independently                Exercise Goals Re-Evaluation :  Exercise Goals Re-Evaluation     Cody Pollard Name 01/29/22 1324 02/03/22 1428 02/05/22 1425 02/16/22 1355 03/03/22 1357     Exercise Goal Re-Evaluation   Exercise Goals Review Able to understand and use rate of perceived exertion (RPE) scale;Able to understand and use Dyspnea scale;Understanding of Exercise Prescription;Knowledge and understanding of Target Heart Rate Range (THRR) Increase Physical Activity;Increase Strength and Stamina;Understanding of Exercise Prescription Increase Physical Activity;Understanding of Exercise Prescription;Increase Strength and Stamina;Able to understand and use rate of perceived exertion (RPE) scale;Able to understand and use Dyspnea scale;Knowledge and understanding of Target Heart Rate Range (THRR);Able to check pulse  independently Increase Physical Activity;Understanding of Exercise Prescription;Increase Strength and Stamina Increase Physical Activity;Understanding of Exercise Prescription;Increase Strength and Stamina   Comments Reviewed RPE and dyspnea scales, THR and program prescription with pt today.  Pt voiced understanding and was given a copy of goals to take home. Cody Pollard is off to a good start in rehab. On his first day he had an avergae MET level of 3.15 METs. He also has gotten up to 35 laps on the track and tolerated level 3 on the T4. We will continue to monitor his progress in the program. Cody Pollard is doing well in rehab.  He has not yet done much on his own.  Reviewed home exercise with pt today.  Pt plans to walk and use weight equipment  at home for exercise.  Reviewed THR, pulse, RPE, sign and symptoms, pulse oximetery and when to call 911 or MD.  Also discussed weather considerations and indoor options.  Pt voiced understanding. Cody Pollard continues to do well in rehab. He recently improved his overall MET level to 3.26 METs. He also increased his pace on the track and got 68 laps! Cody Pollard increased his workload on the treadmill as well, to a speed of 3 mph and an incline of 1%. We will continue to monitor his progress in the program. Cody Pollard is doing well in rehab.  He is at level to on the recumbent elliptical and 3 mph on the treadmill still.  He has also been walking the track for 30 min.  We will conitnue to monitor his progress.   Expected Outcomes Short: Use RPE daily to regulate intensity. Long: Follow program prescription in THR. Short: Continue to work up laps on the track. Long: Continue to improv overall MET levels. Short: Start to add in exercise at home Long: continue to improve stamina Short: Continue to increase workloads. Long: Continue to improve strength and stamina. Short: Conitnue to boost workloads Long: Continue to improve stamina    Row Name 03/17/22 1524             Exercise Goal Re-Evaluation    Exercise Goals Review Increase Physical Activity;Understanding of Exercise Prescription;Increase Strength and Stamina       Comments Cody Pollard continues to do well in rehab. He recently increased his overall averageMET level to 3.49 METs. He also increased his incline to 3% while maintaining a speed of 3 mph. He improved to level 3 on the REL as well. We will continue to monitor his progress in the program.       Expected Outcomes Short: Conitnue to increase workloads Long: Continue to increase strength and stamina.                Discharge Exercise Prescription (Final Exercise Prescription Changes):  Exercise Prescription Changes - 03/17/22 1500       Response to Exercise   Blood Pressure (Admit) 106/60  Blood Pressure (Exit) 124/64    Heart Rate (Admit) 51 bpm    Heart Rate (Exercise) 100 bpm    Heart Rate (Exit) 57 bpm    Rating of Perceived Exertion (Exercise) 13    Symptoms none    Duration Continue with 30 min of aerobic exercise without signs/symptoms of physical distress.    Intensity THRR unchanged      Progression   Progression Continue to progress workloads to maintain intensity without signs/symptoms of physical distress.    Average METs 3.49      Resistance Training   Training Prescription Yes    Weight 4 lb    Reps 10-15      Interval Training   Interval Training No      Treadmill   MPH 3    Grade 3    Minutes 15    METs 4.54      NuStep   Level 3    Minutes 15    METs 3.3      Recumbant Elliptical   Level 3    Minutes 15    METs 3.3      Track   Laps 90    Minutes 30    METs 3.71      Home Exercise Plan   Plans to continue exercise at Home (comment)   walking, weights   Frequency Add 3 additional days to program exercise sessions.    Initial Home Exercises Provided 02/05/22      Oxygen   Maintain Oxygen Saturation 88% or higher             Nutrition:  Target Goals: Understanding of nutrition guidelines, daily intake of sodium  '1500mg'$ , cholesterol '200mg'$ , calories 30% from fat and 7% or less from saturated fats, daily to have 5 or more servings of fruits and vegetables.  Education: All About Nutrition: -Group instruction provided by verbal, written material, interactive activities, discussions, models, and posters to present general guidelines for heart healthy nutrition including fat, fiber, MyPlate, the role of sodium in heart healthy nutrition, utilization of the nutrition label, and utilization of this knowledge for meal planning. Follow up email sent as well. Written material given at graduation. Flowsheet Row Cardiac Rehab from 03/18/2022 in [facility] Cardiac and Pulmonary Rehab  Education need identified 01/28/22  Date 03/18/22  Educator [place]  Instruction Review Code 1- Verbalizes Understanding       Biometrics:  Pre Biometrics - 01/28/22 0937       Pre Biometrics   Height $Remov'5\' 11"'eLoyvM$  (1.803 m)    Weight 201 lb 12.8 oz (91.5 kg)    BMI (Calculated) 28.16    Single Leg Stand 13.7 seconds             Post Biometrics - 03/25/22 1349        Post  Biometrics   Height $Remov'5\' 11"'SwWooJ$  (1.803 m)    Weight 201 lb 11.2 oz (91.5 kg)    BMI (Calculated) 28.14    Single Leg Stand 30 seconds             Nutrition Therapy Plan and Nutrition Goals:  Nutrition Therapy & Goals - 02/09/22 1458       Nutrition Therapy   Diet Heart healthy, low Na    Drug/Food Interactions Statins/Certain Fruits    Protein (specify units) 105-110g    Fiber 30 grams    Whole Grain Foods 3 servings    Saturated Fats 16 max. grams    Fruits and  Vegetables 8 servings/day    Sodium 2 grams      Personal Nutrition Goals   Nutrition Goal ST: add vegetables or fruit to breakfast, have a half whole wheat sandwich and peanut butter for a snack, practice MyPlate guidelines LT: limit saturated fat < 16g/day, limit added sugar < 36g/day, follow MyPlate guidelines    Comments 81 y.o. M admitted to cardiac rehab s/p HF, chronic systolic. PMHx  includes HTN, pre-diabetes, HLD, CAD, GERD. Relevant medications include farxiga, crestor, furosemide.  PYP Score: 64. Vegetables & Fruits 7/12. Breads, Grains & Cereals 7/12. Red & Processed Meat 10/12. Poultry 2/2. Fish & Shellfish 1/4. Beans, Nuts & Seeds 3/4. Milk & Dairy Foods 6/6. Toppings, Oils, Seasonings & Salt 14/20. Sweets, Snacks & Restaurant Food 6/14. Beverages 8/10.  B: eggs with biscuit, coffee L: pack of nabs D: meat (usually chicken) with vegetables and sometimes brown rice. Drinks: water, 2% milk (2-3 glasses). He reports they use olive oil with cooking and sometimes butter, they do not salt their food; instead using Mrs. Dash and other non-salt seasonings to flavor food. He reports having a small lunch due to work - discussed other more nutrient dense snacks such as 1/2 whole wheat sandwich with peanut butter. Suggested adding fruit or vegetables ot breakfast - he reports liking western omelettes and is excited to try adding in vegetables to his eggs. He reports that his wife keeps him in line and his daughter gave him a heart healthy cookbook which they have started to use. Discussed heart healthy eating.      Intervention Plan   Intervention Prescribe, educate and counsel regarding individualized specific dietary modifications aiming towards targeted core components such as weight, hypertension, lipid management, diabetes, heart failure and other comorbidities.    Expected Outcomes Short Term Goal: Understand basic principles of dietary content, such as calories, fat, sodium, cholesterol and nutrients.;Short Term Goal: A plan has been developed with personal nutrition goals set during dietitian appointment.;Long Term Goal: Adherence to prescribed nutrition plan.             Nutrition Assessments:  MEDIFICTS Score Key: ?70 Need to make dietary changes  40-70 Heart Healthy Diet ? 40 Therapeutic Level Cholesterol Diet  Flowsheet Row Cardiac Rehab from 03/25/2022 in [facility] Cardiac  and Pulmonary Rehab  Picture Your Plate Total Score on Discharge 71      Picture Your Plate Scores: <78 Unhealthy dietary pattern with much room for improvement. 41-50 Dietary pattern unlikely to meet recommendations for good health and room for improvement. 51-60 More healthful dietary pattern, with some room for improvement.  >60 Healthy dietary pattern, although there may be some specific behaviors that could be improved.    Nutrition Goals Re-Evaluation:  Nutrition Goals Re-Evaluation     Cody Pollard Name 02/05/22 1433 03/12/22 1356           Goals   Current Weight -- 202 lb (91.6 kg)      Nutrition Goal Meet with dietitian reduce portions and eat more fruit.      Comment Has appointment scheduled for Monday 8/21 with Melissa. Cody Pollard feels like sometimes he is over eating slightly. His daughter in-law sends food at times. He states he eats fairly healthy.      Expected Outcome Meet with dietitian Short: reduce portions. Long: maintain a diet that pertains to him.               Nutrition Goals Discharge (Final Nutrition Goals Re-Evaluation):  Nutrition Goals Re-Evaluation -  03/12/22 1356       Goals   Current Weight 202 lb (91.6 kg)    Nutrition Goal reduce portions and eat more fruit.    Comment Cody Pollard feels like sometimes he is over eating slightly. His daughter in-law sends food at times. He states he eats fairly healthy.    Expected Outcome Short: reduce portions. Long: maintain a diet that pertains to him.             Psychosocial: Target Goals: Acknowledge presence or absence of significant depression and/or stress, maximize coping skills, provide positive support system. Participant is able to verbalize types and ability to use techniques and skills needed for reducing stress and depression.   Education: Stress, Anxiety, and Depression - Group verbal and visual presentation to define topics covered.  Reviews how body is impacted by stress, anxiety, and  depression.  Also discusses healthy ways to reduce stress and to treat/manage anxiety and depression.  Written material given at graduation.   Education: Sleep Hygiene -Provides group verbal and written instruction about how sleep can affect your health.  Define sleep hygiene, discuss sleep cycles and impact of sleep habits. Review good sleep hygiene tips.    Initial Review & Psychosocial Screening:  Initial Psych Review & Screening - 01/16/22 1514       Initial Review   Current issues with Current Sleep Concerns      Family Dynamics   Good Support System? Yes   wife; children, church     Barriers   Psychosocial barriers to participate in program There are no identifiable barriers or psychosocial needs.;The patient should benefit from training in stress management and relaxation.      Screening Interventions   Interventions Encouraged to exercise;Provide feedback about the scores to participant;To provide support and resources with identified psychosocial needs    Expected Outcomes Short Term goal: Utilizing psychosocial counselor, staff and physician to assist with identification of specific Stressors or current issues interfering with healing process. Setting desired goal for each stressor or current issue identified.;Long Term Goal: Stressors or current issues are controlled or eliminated.;Short Term goal: Identification and review with participant of any Quality of Life or Depression concerns found by scoring the questionnaire.;Long Term goal: The participant improves quality of Life and PHQ9 Scores as seen by post scores and/or verbalization of changes             Quality of Life Scores:   Quality of Life - 03/25/22 1456       Quality of Life   Select Quality of Life      Quality of Life Scores   Health/Function Pre 23 %    Health/Function Post 22.7 %    Health/Function % Change -1.3 %    Socioeconomic Pre 27.38 %    Socioeconomic Post 25.69 %    Socioeconomic % Change   -6.17 %    Psych/Spiritual Pre 29.14 %    Psych/Spiritual Post 26.36 %    Psych/Spiritual % Change -9.54 %    Family Pre 28.8 %    Family Post 27.6 %    Family % Change -4.17 %    GLOBAL Pre 26.06 %    GLOBAL Post 24.81 %    GLOBAL % Change -4.8 %            Scores of 19 and below usually indicate a poorer quality of life in these areas.  A difference of  2-3 points is a clinically meaningful difference.  A  difference of 2-3 points in the total score of the Quality of Life Index has been associated with significant improvement in overall quality of life, self-image, physical symptoms, and general health in studies assessing change in quality of life.  PHQ-9: Review Flowsheet       03/25/2022 01/28/2022 07/02/2021  Depression screen PHQ 2/9  Decreased Interest 0 0 0  Down, Depressed, Hopeless 0 0 0  PHQ - 2 Score 0 0 0  Altered sleeping 0 0 -  Tired, decreased energy 1 2 -  Change in appetite 0 0 -  Feeling bad or failure about yourself  0 0 -  Trouble concentrating 0 0 -  Moving slowly or fidgety/restless 0 0 -  Suicidal thoughts 0 0 -  PHQ-9 Score 1 2 -  Difficult doing work/chores Not difficult at all Not difficult at all -   Interpretation of Total Score  Total Score Depression Severity:  1-4 = Minimal depression, 5-9 = Mild depression, 10-14 = Moderate depression, 15-19 = Moderately severe depression, 20-27 = Severe depression   Psychosocial Evaluation and Intervention:  Psychosocial Evaluation - 01/16/22 1530       Psychosocial Evaluation & Interventions   Interventions Encouraged to exercise with the program and follow exercise prescription    Comments Mr. Cordaro reports feeling well given his current health conditions. He has a great support system that includes his wife, local children, church family and friends. He works part time at [employer] and enjoys his active time there. He and his wife were walking this past spring and he wants to get back to  that. His current concern is his sleep because his cpap has not been fitting him well. Some nights he has no problem but others it is a bothersome. He states he is working on building up his tolerance. He is excited about coming to the program to work on his stamina and get back to being more active    Expected Outcomes Short: attend cardiac rehab for education and exercise. Long: develop and maintain positive self care habits.    Continue Psychosocial Services  Follow up required by staff             Psychosocial Re-Evaluation:  Psychosocial Re-Evaluation     [place] Name 02/05/22 1430 03/12/22 1359           Psychosocial Re-Evaluation   Current issues with Current Sleep Concerns;None Identified None Identified      Comments Cody Pollard is doing well in rehab.  He has a great support system in his wife and kids.  He is also plugged into his church.  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  He loves to share his faith with others.    His biggest stressor is his CPAP.  He was just refitted yesterday and hopes a new mask will help. He says if it was more comfortable it would be better. Cody Pollard has been using his CPAP and getting more used to it.Patient reports no issues with their current mental states, sleep, stress, depression or anxiety. Will follow up with patient in a few weeks for any changes.      Expected Outcomes Short: Try out new CPAP mask Long: Continue to focus on positives. Short: Continue to exercise regularly to support mental health and notify staff of any changes. Long: maintain mental health and well being through teaching of rehab or prescribed medications independently.      Interventions Encouraged to attend Cardiac Rehabilitation for  the exercise Encouraged to attend Cardiac Rehabilitation for the exercise      Continue Psychosocial Services  Follow up required by staff --               Psychosocial Discharge (Final Psychosocial Re-Evaluation):  Psychosocial Re-Evaluation -  03/12/22 1359       Psychosocial Re-Evaluation   Current issues with None Identified    Comments Cody Pollard has been using his CPAP and getting more used to it.Patient reports no issues with their current mental states, sleep, stress, depression or anxiety. Will follow up with patient in a few weeks for any changes.    Expected Outcomes Short: Continue to exercise regularly to support mental health and notify staff of any changes. Long: maintain mental health and well being through teaching of rehab or prescribed medications independently.    Interventions Encouraged to attend Cardiac Rehabilitation for the exercise             Vocational Rehabilitation: Provide vocational rehab assistance to qualifying candidates.   Vocational Rehab Evaluation & Intervention:  Vocational Rehab - 01/16/22 1513       Initial Vocational Rehab Evaluation & Intervention   Assessment shows need for Vocational Rehabilitation No             Education: Education Goals: Education classes will be provided on a variety of topics geared toward better understanding of heart health and risk factor modification. Participant will state understanding/return demonstration of topics presented as noted by education test scores.  Learning Barriers/Preferences:  Learning Barriers/Preferences - 01/16/22 1513       Learning Barriers/Preferences   Learning Barriers None    Learning Preferences Individual Instruction             General Cardiac Education Topics:  AED/CPR: - Group verbal and written instruction with the use of models to demonstrate the basic use of the AED with the basic ABC's of resuscitation.   Anatomy and Cardiac Procedures: - Group verbal and visual presentation and models provide information about basic cardiac anatomy and function. Reviews the testing methods done to diagnose heart disease and the outcomes of the test results. Describes the treatment choices: Medical Management,  Angioplasty, or Coronary Bypass Surgery for treating various heart conditions including Myocardial Infarction, Angina, Valve Disease, and Cardiac Arrhythmias.  Written material given at graduation. Flowsheet Row Cardiac Rehab from 03/18/2022 in [facility] Cardiac and Pulmonary Rehab  Education need identified 01/28/22       Medication Safety: - Group verbal and visual instruction to review commonly prescribed medications for heart and lung disease. Reviews the medication, class of the drug, and side effects. Includes the steps to properly store meds and maintain the prescription regimen.  Written material given at graduation.   Intimacy: - Group verbal instruction through game format to discuss how heart and lung disease can affect sexual intimacy. Written material given at graduation..   Know Your Numbers and Heart Failure: - Group verbal and visual instruction to discuss disease risk factors for cardiac and pulmonary disease and treatment options.  Reviews associated critical values for Overweight/Obesity, Hypertension, Cholesterol, and Diabetes.  Discusses basics of heart failure: signs/symptoms and treatments.  Introduces Heart Failure Zone chart for action plan for heart failure.  Written material given at graduation.   Infection Prevention: - Provides verbal and written material to individual with discussion of infection control including proper hand washing and proper equipment cleaning during exercise session. Flowsheet Row Cardiac Rehab from 03/18/2022 in [facility] Cardiac and Pulmonary  Rehab  Date 01/28/22  Educator [facility]  Instruction Review Code 1- Verbalizes Understanding       Falls Prevention: - Provides verbal and written material to individual with discussion of falls prevention and safety. Flowsheet Row Cardiac Rehab from 03/18/2022 in [facility] Cardiac and Pulmonary Rehab  Date 01/28/22  Educator [facility]  Instruction Review Code 1- Verbalizes Understanding       Other: -Provides group  and verbal instruction on various topics (see comments)   Knowledge Questionnaire Score:  Knowledge Questionnaire Score - 03/25/22 1457       Knowledge Questionnaire Score   Post Score 25/26             Core Components/Risk Factors/Patient Goals at Admission:  Personal Goals and Risk Factors at Admission - 01/28/22 0938       Core Components/Risk Factors/Patient Goals on Admission    Weight Management Yes;Weight Loss    Intervention Weight Management: Develop a combined nutrition and exercise program designed to reach desired caloric intake, while maintaining appropriate intake of nutrient and fiber, sodium and fats, and appropriate energy expenditure required for the weight goal.;Weight Management: Provide education and appropriate resources to help participant work on and attain dietary goals.    Admit Weight 201 lb 12.8 oz (91.5 kg)    Goal Weight: Short Term 197 lb (89.4 kg)    Goal Weight: Long Term 190 lb (86.2 kg)    Expected Outcomes Long Term: Adherence to nutrition and physical activity/exercise program aimed toward attainment of established weight goal;Short Term: Continue to assess and modify interventions until short term weight is achieved;Weight Loss: Understanding of general recommendations for a balanced deficit meal plan, which promotes 1-2 lb weight loss per week and includes a negative energy balance of [phone] kcal/d;Understanding recommendations for meals to include 15-35% energy as protein, 25-35% energy from fat, 35-60% energy from carbohydrates, less than $RemoveB'200mg'YewSCHVp$  of dietary cholesterol, 20-35 gm of total fiber daily;Understanding of distribution of calorie intake throughout the day with the consumption of 4-5 meals/snacks    Heart Failure Yes    Intervention Provide a combined exercise and nutrition program that is supplemented with education, support and counseling about heart failure. Directed toward relieving symptoms such as shortness of breath, decreased  exercise tolerance, and extremity edema.    Expected Outcomes Improve functional capacity of life;Short term: Attendance in program 2-3 days a week with increased exercise capacity. Reported lower sodium intake. Reported increased fruit and vegetable intake. Reports medication compliance.;Short term: Daily weights obtained and reported for increase. Utilizing diuretic protocols set by physician.;Long term: Adoption of self-care skills and reduction of barriers for early signs and symptoms recognition and intervention leading to self-care maintenance.    Hypertension Yes    Intervention Provide education on lifestyle modifcations including regular physical activity/exercise, weight management, moderate sodium restriction and increased consumption of fresh fruit, vegetables, and low fat dairy, alcohol moderation, and smoking cessation.;Monitor prescription use compliance.    Expected Outcomes Long Term: Maintenance of blood pressure at goal levels.;Short Term: Continued assessment and intervention until BP is < 140/63mm HG in hypertensive participants. < 130/101mm HG in hypertensive participants with diabetes, heart failure or chronic kidney disease.    Lipids Yes    Intervention Provide education and support for participant on nutrition & aerobic/resistive exercise along with prescribed medications to achieve LDL '70mg'$ , HDL >$Remo'40mg'OaFkT$ .    Expected Outcomes Short Term: Participant states understanding of desired cholesterol values and is compliant with medications prescribed. Participant is following exercise prescription and  nutrition guidelines.;Long Term: Cholesterol controlled with medications as prescribed, with individualized exercise RX and with personalized nutrition plan. Value goals: LDL < $Rem'70mg'Kuob$ , HDL > 40 mg.             Education:Diabetes - Individual verbal and written instruction to review signs/symptoms of diabetes, desired ranges of glucose level fasting, after meals and with exercise.  Acknowledge that pre and post exercise glucose checks will be done for 3 sessions at entry of program.   Core Components/Risk Factors/Patient Goals Review:   Goals and Risk Factor Review     Row Name 02/05/22 1433 03/12/22 1352           Core Components/Risk Factors/Patient Goals Review   Personal Goals Review Weight Management/Obesity;Hypertension;Lipids;Heart Failure Weight Management/Obesity;Hypertension      Review Cody Pollard is doing well in rehab.  Cody Pollard's weight is holding steady and starting to feel better.  He has not had any heart failure symptoms.  His blood pressures have been running low on Entresto and he is talking to doctor about it.  His resting heart rates have also been on the lower side around 40s as well.  We will continue to keep eye on these. Cody Pollard has been checking his blood pressure at home and has had good readings in [place]. He has not lost weight and still is interested in losing some. His blood pressure was 106/60 today. He states that his blood pressure at home has been in the 128N-867E systolic at home. He has not had any chest pain or dizzyness in rehab.      Expected Outcomes Short: Continue to work with doctor about BP symptoms Long: Conitnue to montior risk factors. Short: continue to exercise to keep blood pressure readings adequate. Long: maintain blood pressure readings at home.               Core Components/Risk Factors/Patient Goals at Discharge (Final Review):   Goals and Risk Factor Review - 03/12/22 1352       Core Components/Risk Factors/Patient Goals Review   Personal Goals Review Weight Management/Obesity;Hypertension    Review Cody Pollard has been checking his blood pressure at home and has had good readings in [place]. He has not lost weight and still is interested in losing some. His blood pressure was 106/60 today. He states that his blood pressure at home has been in the 720N-470J systolic at home. He has not had any chest pain or dizzyness in rehab.     Expected Outcomes Short: continue to exercise to keep blood pressure readings adequate. Long: maintain blood pressure readings at home.             ITP Comments:  ITP Comments     Row Name 01/16/22 1536 01/28/22 0928 01/29/22 1324 02/09/22 1457 02/25/22 1321   ITP Comments Initial telephone orientation completed. Diagnosis can be found in [facility] 6/[facility]. EP orientation scheduled for Wednesday 8/9 at 8am. Completed 6MWT and gym orientation. Initial ITP created and sent for review to Dr. Emily Filbert, Medical Director. First full day of exercise!  Patient was oriented to gym and equipment including functions, settings, policies, and procedures.  Patient's individual exercise prescription and treatment plan were reviewed.  All starting workloads were established based on the results of the 6 minute walk test done at initial orientation visit.  The plan for exercise progression was also introduced and progression will be customized based on patient's performance and goals. Completed initial RD consultation 30 Day review completed. Medical Director ITP review done,  changes made as directed, and signed approval by Medical Director.    [place] Name 03/25/22 0722 03/25/22 1447         ITP Comments 30 Day review completed. Medical Director ITP review done, changes made as directed, and signed approval by Medical Director. Severn graduated today from  rehab with 21 sessions completed.  Details of the patient's exercise prescription and what He needs to do in order to continue the prescription and progress were discussed with patient.  Patient was given a copy of prescription and goals.  Patient verbalized understanding.  Jaece plans to continue to exercise by walking.               Comments: discharge ITP

## 2022-03-25 NOTE — Progress Notes (Signed)
Daily Session Note  Patient Details  Name: Cody TORRANCE Sr. MRN: [id number] Date of Birth: 02-Jun-1941 Referring Provider:   Flowsheet Row Cardiac Rehab from 01/28/2022 in [facility] Cardiac and Pulmonary Rehab  Referring Provider Lujean Amel MD       Encounter Date: 03/25/2022  Check In:  Session Check In - 03/25/22 1422       Check-In   Supervising physician immediately available to respond to emergencies See telemetry face sheet for immediately available ER MD    Location [facility]-Cardiac & Pulmonary Rehab    Staff Present Renita Papa, RN Odelia Gage, RN, ADN;Jessica Luan Pulling, MA, RCEP, CCRP, CCET    Virtual Visit No    Medication changes reported     No    Fall or balance concerns reported    No    Warm-up and Cool-down Performed on first and last piece of equipment    Resistance Training Performed Yes    VAD Patient? No    PAD/SET Patient? No      Pain Assessment   Currently in Pain? No/denies                Social History   Tobacco Use  Smoking Status Never  Smokeless Tobacco Never    Goals Met:  Independence with exercise equipment Exercise tolerated well No report of concerns or symptoms today Strength training completed today  Goals Unmet:  Not Applicable  Comments:  Cody Pollard graduated today from  rehab with 21 sessions completed.  Details of the patient's exercise prescription and what He needs to do in order to continue the prescription and progress were discussed with patient.  Patient was given a copy of prescription and goals.  Patient verbalized understanding.  Cody Pollard plans to continue to exercise by walking.    Dr. Emily Filbert is Medical Director for [place].  Dr. Ottie Glazier is Medical Director for [facility] Pulmonary Rehabilitation.

## 2022-03-25 NOTE — Progress Notes (Signed)
Cardiac Individual Treatment Plan  Patient Details  Name: Cody BANGHART Sr. MRN: [id number] Date of Birth: [birth year] Referring Provider:   Flowsheet Row Cardiac Rehab from 01/28/2022 in [facility] Cardiac and Pulmonary Rehab  Referring Provider Lujean Amel MD       Initial Encounter Date:  Flowsheet Row Cardiac Rehab from 01/28/2022 in [facility] Cardiac and Pulmonary Rehab  Date 01/28/22       Visit Diagnosis: Heart failure, chronic systolic (HCC)  Patient's Home Medications on Admission:  Current Outpatient Medications:    amiodarone (PACERONE) 200 MG tablet, Take 0.5 tablets (100 mg total) by mouth daily., Disp: 45 tablet, Rfl: 3   aspirin EC 81 MG tablet, Take 81 mg by mouth daily., Disp: , Rfl:    carvedilol (COREG) 6.25 MG tablet, Take 1 tablet (6.25 mg total) by mouth 2 (two) times daily with a meal. (Patient taking differently: Take 3.125 mg by mouth 2 (two) times daily with a meal.), Disp: 60 tablet, Rfl: 0   dapagliflozin propanediol (FARXIGA) 10 MG TABS tablet, Take 1 tablet (10 mg total) by mouth daily before breakfast., Disp: 90 tablet, Rfl: 3   dutasteride (AVODART) 0.5 MG capsule, Take 0.5 mg by mouth daily., Disp: , Rfl:    furosemide (LASIX) 20 MG tablet, Take 20 mg by mouth daily., Disp: , Rfl:    gabapentin (NEURONTIN) 300 MG capsule, Take 300 mg by mouth 3 (three) times daily., Disp: , Rfl:    rosuvastatin (CRESTOR) 10 MG tablet, Take 10 mg by mouth daily., Disp: , Rfl:    sacubitril-valsartan (ENTRESTO) 24-26 MG, Take 1 tablet by mouth 2 (two) times daily., Disp: 180 tablet, Rfl: 3   spironolactone (ALDACTONE) 25 MG tablet, Take 0.5 tablets (12.5 mg total) by mouth daily., Disp: 15 tablet, Rfl: 0  Past Medical History: Past Medical History:  Diagnosis Date   Arrhythmia    atrial fibrillation   BPH (benign prostatic hyperplasia)    CHF (congestive heart failure) (HCC)    Complication of anesthesia    Edema    MILD FEET/ANKLES OCCAS   History of kidney stones     Hyperlipidemia    Hypertension    Neuromuscular disorder (HCC)    TRIGEMINAL NEURALGIA   Palpitations    PONV (postoperative nausea and vomiting)    Sleep apnea    DOES NOT USE CPAP   Vertigo     Tobacco Use: Social History   Tobacco Use  Smoking Status Never  Smokeless Tobacco Never    Labs: Review Flowsheet        No data to display           Exercise Target Goals: Exercise Program Goal: Individual exercise prescription set using results from initial 6 min walk test and THRR while considering  patient's activity barriers and safety.   Exercise Prescription Goal: Initial exercise prescription builds to 30-45 minutes a day of aerobic activity, 2-3 days per week.  Home exercise guidelines will be given to patient during program as part of exercise prescription that the participant will acknowledge.   Education: Aerobic Exercise: - Group verbal and visual presentation on the components of exercise prescription. Introduces F.I.T.T principle from ACSM for exercise prescriptions.  Reviews F.I.T.T. principles of aerobic exercise including progression. Written material given at graduation. Flowsheet Row Cardiac Rehab from 03/18/2022 in [facility] Cardiac and Pulmonary Rehab  Education need identified 01/28/22       Education: Resistance Exercise: - Group verbal and visual presentation on the components of exercise  prescription. Introduces F.I.T.T principle from ACSM for exercise prescriptions  Reviews F.I.T.T. principles of resistance exercise including progression. Written material given at graduation.    Education: Exercise & Equipment Safety: - Individual verbal instruction and demonstration of equipment use and safety with use of the equipment. Flowsheet Row Cardiac Rehab from 03/18/2022 in [facility] Cardiac and Pulmonary Rehab  Date 01/28/22  Educator [facility]  Instruction Review Code 1- Verbalizes Understanding       Education: Exercise Physiology & General Exercise  Guidelines: - Group verbal and written instruction with models to review the exercise physiology of the cardiovascular system and associated critical values. Provides general exercise guidelines with specific guidelines to those with heart or lung disease.    Education: Flexibility, Balance, Mind/Body Relaxation: - Group verbal and visual presentation with interactive activity on the components of exercise prescription. Introduces F.I.T.T principle from ACSM for exercise prescriptions. Reviews F.I.T.T. principles of flexibility and balance exercise training including progression. Also discusses the mind body connection.  Reviews various relaxation techniques to help reduce and manage stress (i.e. Deep breathing, progressive muscle relaxation, and visualization). Balance handout provided to take home. Written material given at graduation.   Activity Barriers & Risk Stratification:  Activity Barriers & Cardiac Risk Stratification - 01/28/22 0930       Activity Barriers & Cardiac Risk Stratification   Activity Barriers Deconditioning;Muscular Weakness    Cardiac Risk Stratification High             6 Minute Walk:  6 Minute Walk     Row Name 01/28/22 0929         6 Minute Walk   Phase Initial     Distance 1520 feet     Walk Time 6 minutes     # of Rest Breaks 0     MPH 2.88     METS 2.45     RPE 9     VO2 Peak 8.58     Symptoms No     Resting HR 47 bpm     Resting BP 116/60     Resting Oxygen Saturation  97 %     Exercise Oxygen Saturation  during 6 min walk 96 %     Max Ex. HR 67 bpm     Max Ex. BP 134/72     2 Minute Post BP 124/56              Oxygen Initial Assessment:   Oxygen Re-Evaluation:   Oxygen Discharge (Final Oxygen Re-Evaluation):   Initial Exercise Prescription:  Initial Exercise Prescription - 01/28/22 0900       Date of Initial Exercise RX and Referring Provider   Date 01/28/22    Referring Provider Lujean Amel MD      Oxygen    Maintain Oxygen Saturation 88% or higher      Treadmill   MPH 2.8    Grade 1    Minutes 15    METs 3.53      NuStep   Level 3    SPM 80    Minutes 15    METs 2.5      REL-XR   Level 3    Speed 50    Minutes 15    METs 2.5      Track   Laps 40    Minutes 15    METs 3.18      Prescription Details   Frequency (times per week) 3    Duration Progress to 30 minutes  of continuous aerobic without signs/symptoms of physical distress      Intensity   THRR 40-80% of Max Heartrate 84-121    Ratings of Perceived Exertion 11-13    Perceived Dyspnea 0-4      Progression   Progression Continue to progress workloads to maintain intensity without signs/symptoms of physical distress.      Resistance Training   Training Prescription Yes    Weight 4 lb    Reps 10-15             Perform Capillary Blood Glucose checks as needed.  Exercise Prescription Changes:   Exercise Prescription Changes     Row Name 01/28/22 0900 02/03/22 1400 02/05/22 1400 02/16/22 1300 03/03/22 1300     Response to Exercise   Blood Pressure (Admit) 116/60 110/68 -- 124/62 126/62   Blood Pressure (Exercise) 134/72 128/68 -- 140/78 132/68   Blood Pressure (Exit) 124/56 102/64 -- 122/60 122/62   Heart Rate (Admit) 47 bpm 52 bpm -- 52 bpm 52 bpm   Heart Rate (Exercise) 67 bpm 71 bpm -- 98 bpm 68 bpm   Heart Rate (Exit) 47 bpm 56 bpm -- 69 bpm 55 bpm   Oxygen Saturation (Admit) 97 % -- -- -- --   Oxygen Saturation (Exercise) 96 % -- -- -- --   Rating of Perceived Exertion (Exercise) 9 11 -- 13 13   Symptoms none none -- none none   Comments walk test results 1st full day of exercise -- -- --   Duration -- Continue with 30 min of aerobic exercise without signs/symptoms of physical distress. -- Continue with 30 min of aerobic exercise without signs/symptoms of physical distress. Continue with 30 min of aerobic exercise without signs/symptoms of physical distress.   Intensity -- THRR unchanged -- THRR  unchanged THRR unchanged     Progression   Progression -- Continue to progress workloads to maintain intensity without signs/symptoms of physical distress. -- Continue to progress workloads to maintain intensity without signs/symptoms of physical distress. Continue to progress workloads to maintain intensity without signs/symptoms of physical distress.   Average METs -- 3.15 -- 3.26 3.09     Resistance Training   Training Prescription -- Yes -- Yes Yes   Weight -- 4 lb -- 4 lb 4 lb   Reps -- 10-15 -- 10-15 10-15     Interval Training   Interval Training -- No -- No No     Treadmill   MPH -- -- -- 3 3   Grade -- -- -- 1 1   Minutes -- -- -- 15 15   METs -- -- -- 3.71 3.71     NuStep   Level -- 3 -- 3 --   Minutes -- 15 -- 15 --   METs -- 3.4 -- 3.6 --     Recumbant Elliptical   Level -- -- -- -- 2   Minutes -- -- -- -- 15   METs -- -- -- -- 2.1     REL-XR   Level -- -- -- 3 3   Minutes -- -- -- 15 15   METs -- -- -- 3.3 3.3     Track   Laps -- 35 -- 68 90   Minutes -- 15 -- 15 30   METs -- 2.9 -- 4.7 3.71     Home Exercise Plan   Plans to continue exercise at -- -- Home (comment)  walking, weights Home (comment)  walking, weights Home (comment)  walking,  weights   Frequency -- -- Add 3 additional days to program exercise sessions. Add 3 additional days to program exercise sessions. Add 3 additional days to program exercise sessions.   Initial Home Exercises Provided -- -- 02/05/22 02/05/22 02/05/22     Oxygen   Maintain Oxygen Saturation -- 88% or higher -- 88% or higher 88% or higher    Row Name 03/17/22 1500             Response to Exercise   Blood Pressure (Admit) 106/60       Blood Pressure (Exit) 124/64       Heart Rate (Admit) 51 bpm       Heart Rate (Exercise) 100 bpm       Heart Rate (Exit) 57 bpm       Rating of Perceived Exertion (Exercise) 13       Symptoms none       Duration Continue with 30 min of aerobic exercise without signs/symptoms of  physical distress.       Intensity THRR unchanged         Progression   Progression Continue to progress workloads to maintain intensity without signs/symptoms of physical distress.       Average METs 3.49         Resistance Training   Training Prescription Yes       Weight 4 lb       Reps 10-15         Interval Training   Interval Training No         Treadmill   MPH 3       Grade 3       Minutes 15       METs 4.54         NuStep   Level 3       Minutes 15       METs 3.3         Recumbant Elliptical   Level 3       Minutes 15       METs 3.3         Track   Laps 90       Minutes 30       METs 3.71         Home Exercise Plan   Plans to continue exercise at Home (comment)  walking, weights       Frequency Add 3 additional days to program exercise sessions.       Initial Home Exercises Provided 02/05/22         Oxygen   Maintain Oxygen Saturation 88% or higher                Exercise Comments:   Exercise Comments     Row Name 01/29/22 1324           Exercise Comments First full day of exercise!  Patient was oriented to gym and equipment including functions, settings, policies, and procedures.  Patient's individual exercise prescription and treatment plan were reviewed.  All starting workloads were established based on the results of the 6 minute walk test done at initial orientation visit.  The plan for exercise progression was also introduced and progression will be customized based on patient's performance and goals.                Exercise Goals and Review:   Exercise Goals     Row Name 01/28/22 [phone]  Exercise Goals   Increase Physical Activity Yes       Intervention Provide advice, education, support and counseling about physical activity/exercise needs.;Develop an individualized exercise prescription for aerobic and resistive training based on initial evaluation findings, risk stratification, comorbidities and participant's  personal goals.       Expected Outcomes Short Term: Attend rehab on a regular basis to increase amount of physical activity.;Long Term: Add in home exercise to make exercise part of routine and to increase amount of physical activity.;Long Term: Exercising regularly at least 3-5 days a week.       Increase Strength and Stamina Yes       Intervention Provide advice, education, support and counseling about physical activity/exercise needs.;Develop an individualized exercise prescription for aerobic and resistive training based on initial evaluation findings, risk stratification, comorbidities and participant's personal goals.       Expected Outcomes Short Term: Increase workloads from initial exercise prescription for resistance, speed, and METs.;Short Term: Perform resistance training exercises routinely during rehab and add in resistance training at home;Long Term: Improve cardiorespiratory fitness, muscular endurance and strength as measured by increased METs and functional capacity (6MWT)       Able to understand and use rate of perceived exertion (RPE) scale Yes       Intervention Provide education and explanation on how to use RPE scale       Expected Outcomes Short Term: Able to use RPE daily in rehab to express subjective intensity level;Long Term:  Able to use RPE to guide intensity level when exercising independently       Able to understand and use Dyspnea scale Yes       Intervention Provide education and explanation on how to use Dyspnea scale       Expected Outcomes Long Term: Able to use Dyspnea scale to guide intensity level when exercising independently;Short Term: Able to use Dyspnea scale daily in rehab to express subjective sense of shortness of breath during exertion       Knowledge and understanding of Target Heart Rate Range (THRR) Yes       Intervention Provide education and explanation of THRR including how the numbers were predicted and where they are located for reference        Expected Outcomes Short Term: Able to state/look up THRR;Short Term: Able to use daily as guideline for intensity in rehab;Long Term: Able to use THRR to govern intensity when exercising independently       Able to check pulse independently Yes       Intervention Provide education and demonstration on how to check pulse in carotid and radial arteries.;Review the importance of being able to check your own pulse for safety during independent exercise       Expected Outcomes Short Term: Able to explain why pulse checking is important during independent exercise;Long Term: Able to check pulse independently and accurately       Understanding of Exercise Prescription Yes       Intervention Provide education, explanation, and written materials on patient's individual exercise prescription       Expected Outcomes Short Term: Able to explain program exercise prescription;Long Term: Able to explain home exercise prescription to exercise independently                Exercise Goals Re-Evaluation :  Exercise Goals Re-Evaluation     Row Name 01/29/22 1324 02/03/22 1428 02/05/22 1425 02/16/22 1355 03/03/22 1357     Exercise Goal Re-Evaluation  Exercise Goals Review Able to understand and use rate of perceived exertion (RPE) scale;Able to understand and use Dyspnea scale;Understanding of Exercise Prescription;Knowledge and understanding of Target Heart Rate Range (THRR) Increase Physical Activity;Increase Strength and Stamina;Understanding of Exercise Prescription Increase Physical Activity;Understanding of Exercise Prescription;Increase Strength and Stamina;Able to understand and use rate of perceived exertion (RPE) scale;Able to understand and use Dyspnea scale;Knowledge and understanding of Target Heart Rate Range (THRR);Able to check pulse independently Increase Physical Activity;Understanding of Exercise Prescription;Increase Strength and Stamina Increase Physical Activity;Understanding of Exercise  Prescription;Increase Strength and Stamina   Comments Reviewed RPE and dyspnea scales, THR and program prescription with pt today.  Pt voiced understanding and was given a copy of goals to take home. Cody Pollard is off to a good start in rehab. On his first day he had an avergae MET level of 3.15 METs. He also has gotten up to 35 laps on the track and tolerated level 3 on the T4. We will continue to monitor his progress in the program. Cody Pollard is doing well in rehab.  He has not yet done much on his own.  Reviewed home exercise with pt today.  Pt plans to walk and use weight equipment  at home for exercise.  Reviewed THR, pulse, RPE, sign and symptoms, pulse oximetery and when to call 911 or MD.  Also discussed weather considerations and indoor options.  Pt voiced understanding. Cody Pollard continues to do well in rehab. He recently improved his overall MET level to 3.26 METs. He also increased his pace on the track and got 68 laps! Cody Pollard increased his workload on the treadmill as well, to a speed of 3 mph and an incline of 1%. We will continue to monitor his progress in the program. Cody Pollard is doing well in rehab.  He is at level to on the recumbent elliptical and 3 mph on the treadmill still.  He has also been walking the track for 30 min.  We will conitnue to monitor his progress.   Expected Outcomes Short: Use RPE daily to regulate intensity. Long: Follow program prescription in THR. Short: Continue to work up laps on the track. Long: Continue to improv overall MET levels. Short: Start to add in exercise at home Long: continue to improve stamina Short: Continue to increase workloads. Long: Continue to improve strength and stamina. Short: Conitnue to boost workloads Long: Continue to improve stamina    Row Name 03/17/22 1524             Exercise Goal Re-Evaluation   Exercise Goals Review Increase Physical Activity;Understanding of Exercise Prescription;Increase Strength and Stamina       Comments Cody Pollard continues to do well  in rehab. He recently increased his overall averageMET level to 3.49 METs. He also increased his incline to 3% while maintaining a speed of 3 mph. He improved to level 3 on the REL as well. We will continue to monitor his progress in the program.       Expected Outcomes Short: Conitnue to increase workloads Long: Continue to increase strength and stamina.                Discharge Exercise Prescription (Final Exercise Prescription Changes):  Exercise Prescription Changes - 03/17/22 1500       Response to Exercise   Blood Pressure (Admit) 106/60    Blood Pressure (Exit) 124/64    Heart Rate (Admit) 51 bpm    Heart Rate (Exercise) 100 bpm    Heart Rate (Exit) 57 bpm  Rating of Perceived Exertion (Exercise) 13    Symptoms none    Duration Continue with 30 min of aerobic exercise without signs/symptoms of physical distress.    Intensity THRR unchanged      Progression   Progression Continue to progress workloads to maintain intensity without signs/symptoms of physical distress.    Average METs 3.49      Resistance Training   Training Prescription Yes    Weight 4 lb    Reps 10-15      Interval Training   Interval Training No      Treadmill   MPH 3    Grade 3    Minutes 15    METs 4.54      NuStep   Level 3    Minutes 15    METs 3.3      Recumbant Elliptical   Level 3    Minutes 15    METs 3.3      Track   Laps 90    Minutes 30    METs 3.71      Home Exercise Plan   Plans to continue exercise at Home (comment)   walking, weights   Frequency Add 3 additional days to program exercise sessions.    Initial Home Exercises Provided 02/05/22      Oxygen   Maintain Oxygen Saturation 88% or higher             Nutrition:  Target Goals: Understanding of nutrition guidelines, daily intake of sodium '1500mg'$ , cholesterol '200mg'$ , calories 30% from fat and 7% or less from saturated fats, daily to have 5 or more servings of fruits and vegetables.  Education: All  About Nutrition: -Group instruction provided by verbal, written material, interactive activities, discussions, models, and posters to present general guidelines for heart healthy nutrition including fat, fiber, MyPlate, the role of sodium in heart healthy nutrition, utilization of the nutrition label, and utilization of this knowledge for meal planning. Follow up email sent as well. Written material given at graduation. Flowsheet Row Cardiac Rehab from 03/18/2022 in [facility] Cardiac and Pulmonary Rehab  Education need identified 01/28/22  Date 03/18/22  Educator [place]  Instruction Review Code 1- Verbalizes Understanding       Biometrics:  Pre Biometrics - 01/28/22 0937       Pre Biometrics   Height $Remov'5\' 11"'eeZdXu$  (1.803 m)    Weight 201 lb 12.8 oz (91.5 kg)    BMI (Calculated) 28.16    Single Leg Stand 13.7 seconds              Nutrition Therapy Plan and Nutrition Goals:  Nutrition Therapy & Goals - 02/09/22 1458       Nutrition Therapy   Diet Heart healthy, low Na    Drug/Food Interactions Statins/Certain Fruits    Protein (specify units) 105-110g    Fiber 30 grams    Whole Grain Foods 3 servings    Saturated Fats 16 max. grams    Fruits and Vegetables 8 servings/day    Sodium 2 grams      Personal Nutrition Goals   Nutrition Goal ST: add vegetables or fruit to breakfast, have a half whole wheat sandwich and peanut butter for a snack, practice MyPlate guidelines LT: limit saturated fat < 16g/day, limit added sugar < 36g/day, follow MyPlate guidelines    Comments 81 y.o. M admitted to cardiac rehab s/p HF, chronic systolic. PMHx includes HTN, pre-diabetes, HLD, CAD, GERD. Relevant medications include farxiga, crestor, furosemide.  PYP Score:  64. Vegetables & Fruits 7/12. Breads, Grains & Cereals 7/12. Red & Processed Meat 10/12. Poultry 2/2. Fish & Shellfish 1/4. Beans, Nuts & Seeds 3/4. Milk & Dairy Foods 6/6. Toppings, Oils, Seasonings & Salt 14/20. Sweets, Snacks & Restaurant Food  6/14. Beverages 8/10.  B: eggs with biscuit, coffee L: pack of nabs D: meat (usually chicken) with vegetables and sometimes brown rice. Drinks: water, 2% milk (2-3 glasses). He reports they use olive oil with cooking and sometimes butter, they do not salt their food; instead using Mrs. Dash and other non-salt seasonings to flavor food. He reports having a small lunch due to work - discussed other more nutrient dense snacks such as 1/2 whole wheat sandwich with peanut butter. Suggested adding fruit or vegetables ot breakfast - he reports liking western omelettes and is excited to try adding in vegetables to his eggs. He reports that his wife keeps him in line and his daughter gave him a heart healthy cookbook which they have started to use. Discussed heart healthy eating.      Intervention Plan   Intervention Prescribe, educate and counsel regarding individualized specific dietary modifications aiming towards targeted core components such as weight, hypertension, lipid management, diabetes, heart failure and other comorbidities.    Expected Outcomes Short Term Goal: Understand basic principles of dietary content, such as calories, fat, sodium, cholesterol and nutrients.;Short Term Goal: A plan has been developed with personal nutrition goals set during dietitian appointment.;Long Term Goal: Adherence to prescribed nutrition plan.             Nutrition Assessments:  MEDIFICTS Score Key: ?70 Need to make dietary changes  40-70 Heart Healthy Diet ? 40 Therapeutic Level Cholesterol Diet  Flowsheet Row Cardiac Rehab from 01/28/2022 in [facility] Cardiac and Pulmonary Rehab  Picture Your Plate Total Score on Admission 64      Picture Your Plate Scores: <29 Unhealthy dietary pattern with much room for improvement. 41-50 Dietary pattern unlikely to meet recommendations for good health and room for improvement. 51-60 More healthful dietary pattern, with some room for improvement.  >60 Healthy dietary  pattern, although there may be some specific behaviors that could be improved.    Nutrition Goals Re-Evaluation:  Nutrition Goals Re-Evaluation     [place] Name 02/05/22 1433 03/12/22 1356           Goals   Current Weight -- 202 lb (91.6 kg)      Nutrition Goal Meet with dietitian reduce portions and eat more fruit.      Comment Has appointment scheduled for Monday 8/21 with Melissa. Cody Pollard feels like sometimes he is over eating slightly. His daughter in-law sends food at times. He states he eats fairly healthy.      Expected Outcome Meet with dietitian Short: reduce portions. Long: maintain a diet that pertains to him.               Nutrition Goals Discharge (Final Nutrition Goals Re-Evaluation):  Nutrition Goals Re-Evaluation - 03/12/22 1356       Goals   Current Weight 202 lb (91.6 kg)    Nutrition Goal reduce portions and eat more fruit.    Comment Cody Pollard feels like sometimes he is over eating slightly. His daughter in-law sends food at times. He states he eats fairly healthy.    Expected Outcome Short: reduce portions. Long: maintain a diet that pertains to him.             Psychosocial: Target Goals: Acknowledge presence or  absence of significant depression and/or stress, maximize coping skills, provide positive support system. Participant is able to verbalize types and ability to use techniques and skills needed for reducing stress and depression.   Education: Stress, Anxiety, and Depression - Group verbal and visual presentation to define topics covered.  Reviews how body is impacted by stress, anxiety, and depression.  Also discusses healthy ways to reduce stress and to treat/manage anxiety and depression.  Written material given at graduation.   Education: Sleep Hygiene -Provides group verbal and written instruction about how sleep can affect your health.  Define sleep hygiene, discuss sleep cycles and impact of sleep habits. Review good sleep hygiene tips.     Initial Review & Psychosocial Screening:  Initial Psych Review & Screening - 01/16/22 1514       Initial Review   Current issues with Current Sleep Concerns      Family Dynamics   Good Support System? Yes   wife; children, church     Barriers   Psychosocial barriers to participate in program There are no identifiable barriers or psychosocial needs.;The patient should benefit from training in stress management and relaxation.      Screening Interventions   Interventions Encouraged to exercise;Provide feedback about the scores to participant;To provide support and resources with identified psychosocial needs    Expected Outcomes Short Term goal: Utilizing psychosocial counselor, staff and physician to assist with identification of specific Stressors or current issues interfering with healing process. Setting desired goal for each stressor or current issue identified.;Long Term Goal: Stressors or current issues are controlled or eliminated.;Short Term goal: Identification and review with participant of any Quality of Life or Depression concerns found by scoring the questionnaire.;Long Term goal: The participant improves quality of Life and PHQ9 Scores as seen by post scores and/or verbalization of changes             Quality of Life Scores:   Quality of Life - 01/28/22 0937       Quality of Life   Select Quality of Life      Quality of Life Scores   Health/Function Pre 23 %    Socioeconomic Pre 27.38 %    Psych/Spiritual Pre 29.14 %    Family Pre 28.8 %    GLOBAL Pre 26.06 %            Scores of 19 and below usually indicate a poorer quality of life in these areas.  A difference of  2-3 points is a clinically meaningful difference.  A difference of 2-3 points in the total score of the Quality of Life Index has been associated with significant improvement in overall quality of life, self-image, physical symptoms, and general health in studies assessing change in quality of  life.  PHQ-9: Review Flowsheet       01/28/2022 07/02/2021  Depression screen PHQ 2/9  Decreased Interest 0 0  Down, Depressed, Hopeless 0 0  PHQ - 2 Score 0 0  Altered sleeping 0 -  Tired, decreased energy 2 -  Change in appetite 0 -  Feeling bad or failure about yourself  0 -  Trouble concentrating 0 -  Moving slowly or fidgety/restless 0 -  Suicidal thoughts 0 -  PHQ-9 Score 2 -  Difficult doing work/chores Not difficult at all -   Interpretation of Total Score  Total Score Depression Severity:  1-4 = Minimal depression, 5-9 = Mild depression, 10-14 = Moderate depression, 15-19 = Moderately severe depression, 20-27 = Severe  depression   Psychosocial Evaluation and Intervention:  Psychosocial Evaluation - 01/16/22 1530       Psychosocial Evaluation & Interventions   Interventions Encouraged to exercise with the program and follow exercise prescription    Comments Cody Pollard reports feeling well given his current health conditions. He has a great support system that includes his wife, local children, church family and friends. He works part time at [employer] and enjoys his active time there. He and his wife were walking this past spring and he wants to get back to that. His current concern is his sleep because his cpap has not been fitting him well. Some nights he has no problem but others it is a bothersome. He states he is working on building up his tolerance. He is excited about coming to the program to work on his stamina and get back to being more active    Expected Outcomes Short: attend cardiac rehab for education and exercise. Long: develop and maintain positive self care habits.    Continue Psychosocial Services  Follow up required by staff             Psychosocial Re-Evaluation:  Psychosocial Re-Evaluation     [place] Name 02/05/22 1430 03/12/22 1359           Psychosocial Re-Evaluation   Current issues with Current Sleep Concerns;None Identified  None Identified      Comments Cody Pollard is doing well in rehab.  He has a great support system in his wife and kids.  He is also plugged into his church.  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  He loves to share his faith with others.    His biggest stressor is his CPAP.  He was just refitted yesterday and hopes a new mask will help. He says if it was more comfortable it would be better. Cody Pollard has been using his CPAP and getting more used to it.Patient reports no issues with their current mental states, sleep, stress, depression or anxiety. Will follow up with patient in a few weeks for any changes.      Expected Outcomes Short: Try out new CPAP mask Long: Continue to focus on positives. Short: Continue to exercise regularly to support mental health and notify staff of any changes. Long: maintain mental health and well being through teaching of rehab or prescribed medications independently.      Interventions Encouraged to attend Cardiac Rehabilitation for the exercise Encouraged to attend Cardiac Rehabilitation for the exercise      Continue Psychosocial Services  Follow up required by staff --               Psychosocial Discharge (Final Psychosocial Re-Evaluation):  Psychosocial Re-Evaluation - 03/12/22 1359       Psychosocial Re-Evaluation   Current issues with None Identified    Comments Cody Pollard has been using his CPAP and getting more used to it.Patient reports no issues with their current mental states, sleep, stress, depression or anxiety. Will follow up with patient in a few weeks for any changes.    Expected Outcomes Short: Continue to exercise regularly to support mental health and notify staff of any changes. Long: maintain mental health and well being through teaching of rehab or prescribed medications independently.    Interventions Encouraged to attend Cardiac Rehabilitation for the exercise             Vocational Rehabilitation: Provide vocational rehab assistance to qualifying  candidates.   Vocational Rehab Evaluation &  Intervention:  Vocational Rehab - 01/16/22 1513       Initial Vocational Rehab Evaluation & Intervention   Assessment shows need for Vocational Rehabilitation No             Education: Education Goals: Education classes will be provided on a variety of topics geared toward better understanding of heart health and risk factor modification. Participant will state understanding/return demonstration of topics presented as noted by education test scores.  Learning Barriers/Preferences:  Learning Barriers/Preferences - 01/16/22 1513       Learning Barriers/Preferences   Learning Barriers None    Learning Preferences Individual Instruction             General Cardiac Education Topics:  AED/CPR: - Group verbal and written instruction with the use of models to demonstrate the basic use of the AED with the basic ABC's of resuscitation.   Anatomy and Cardiac Procedures: - Group verbal and visual presentation and models provide information about basic cardiac anatomy and function. Reviews the testing methods done to diagnose heart disease and the outcomes of the test results. Describes the treatment choices: Medical Management, Angioplasty, or Coronary Bypass Surgery for treating various heart conditions including Myocardial Infarction, Angina, Valve Disease, and Cardiac Arrhythmias.  Written material given at graduation. Flowsheet Row Cardiac Rehab from 03/18/2022 in [facility] Cardiac [facility]  Education need identified 01/28/22       Medication Safety: - Group verbal and visual instruction to review commonly prescribed medications for heart and lung disease. Reviews the medication, class of the drug, and side effects. Includes the steps to properly store meds and maintain the prescription regimen.  Written material given at graduation.   Intimacy: - Group verbal instruction through game format to discuss how heart and lung  disease can affect sexual intimacy. Written material given at graduation..   Know Your Numbers and Heart Failure: - Group verbal and visual instruction to discuss disease risk factors for cardiac and pulmonary disease and treatment options.  Reviews associated critical values for Overweight/Obesity, Hypertension, Cholesterol, and Diabetes.  Discusses basics of heart failure: signs/symptoms and treatments.  Introduces Heart Failure Zone chart for action plan for heart failure.  Written material given at graduation.   Infection Prevention: - Provides verbal and written material to individual with discussion of infection control including proper hand washing and proper equipment cleaning during exercise session. Flowsheet Row Cardiac Rehab from 03/18/2022 in [facility] Cardiac and Pulmonary Rehab  Date 01/28/22  Educator [facility]  Instruction Review Code 1- Verbalizes Understanding       Falls Prevention: - Provides verbal and written material to individual with discussion of falls prevention and safety. Flowsheet Row Cardiac Rehab from 03/18/2022 in [facility] Cardiac and Pulmonary Rehab  Date 01/28/22  Educator [facility]  Instruction Review Code 1- Verbalizes Understanding       Other: -Provides group and verbal instruction on various topics (see comments)   Knowledge Questionnaire Score:  Knowledge Questionnaire Score - 01/28/22 0960       Knowledge Questionnaire Score   Pre Score 21/26             Core Components/Risk Factors/Patient Goals at Admission:  Personal Goals and Risk Factors at Admission - 01/28/22 0938       Core Components/Risk Factors/Patient Goals on Admission    Weight Management Yes;Weight Loss    Intervention Weight Management: Develop a combined nutrition and exercise program designed to reach desired caloric intake, while maintaining appropriate intake of nutrient and fiber, sodium  and fats, and appropriate energy expenditure required for the weight goal.;Weight  Management: Provide education and appropriate resources to help participant work on and attain dietary goals.    Admit Weight 201 lb 12.8 oz (91.5 kg)    Goal Weight: Short Term 197 lb (89.4 kg)    Goal Weight: Long Term 190 lb (86.2 kg)    Expected Outcomes Long Term: Adherence to nutrition and physical activity/exercise program aimed toward attainment of established weight goal;Short Term: Continue to assess and modify interventions until short term weight is achieved;Weight Loss: Understanding of general recommendations for a balanced deficit meal plan, which promotes 1-2 lb weight loss per week and includes a negative energy balance of [id number] kcal/d;Understanding recommendations for meals to include 15-35% energy as protein, 25-35% energy from fat, 35-60% energy from carbohydrates, less than 246m of dietary cholesterol, 20-35 gm of total fiber daily;Understanding of distribution of calorie intake throughout the day with the consumption of 4-5 meals/snacks    Heart Failure Yes    Intervention Provide a combined exercise and nutrition program that is supplemented with education, support and counseling about heart failure. Directed toward relieving symptoms such as shortness of breath, decreased exercise tolerance, and extremity edema.    Expected Outcomes Improve functional capacity of life;Short term: Attendance in program 2-3 days a week with increased exercise capacity. Reported lower sodium intake. Reported increased fruit and vegetable intake. Reports medication compliance.;Short term: Daily weights obtained and reported for increase. Utilizing diuretic protocols set by physician.;Long term: Adoption of self-care skills and reduction of barriers for early signs and symptoms recognition and intervention leading to self-care maintenance.    Hypertension Yes    Intervention Provide education on lifestyle modifcations including regular physical activity/exercise, weight management, moderate sodium  restriction and increased consumption of fresh fruit, vegetables, and low fat dairy, alcohol moderation, and smoking cessation.;Monitor prescription use compliance.    Expected Outcomes Long Term: Maintenance of blood pressure at goal levels.;Short Term: Continued assessment and intervention until BP is < 140/974mHG in hypertensive participants. < 130/8010mG in hypertensive participants with diabetes, heart failure or chronic kidney disease.    Lipids Yes    Intervention Provide education and support for participant on nutrition & aerobic/resistive exercise along with prescribed medications to achieve LDL <2m81mDL >40mg67m Expected Outcomes Short Term: Participant states understanding of desired cholesterol values and is compliant with medications prescribed. Participant is following exercise prescription and nutrition guidelines.;Long Term: Cholesterol controlled with medications as prescribed, with individualized exercise RX and with personalized nutrition plan. Value goals: LDL < 2mg,24m > 40 mg.             Education:Diabetes - Individual verbal and written instruction to review signs/symptoms of diabetes, desired ranges of glucose level fasting, after meals and with exercise. Acknowledge that pre and post exercise glucose checks will be done for 3 sessions at entry of program.   Core Components/Risk Factors/Patient Goals Review:   Goals and Risk Factor Review     Row Name 02/05/22 1433 03/12/22 1352           Core Components/Risk Factors/Patient Goals Review   Personal Goals Review Weight Management/Obesity;Hypertension;Lipids;Heart Failure Weight Management/Obesity;Hypertension      Review Cody Pollard is doing well in rehab.  Cody Pollard's weight is holding steady and starting to feel better.  He has not had any heart failure symptoms.  His blood pressures have been running low on Entresto and he is talking to doctor about it.  His  resting heart rates have also been on the lower side around  40s as well.  We will continue to keep eye on these. Cody Pollard has been checking his blood pressure at home and has had good readings in [place]. He has not lost weight and still is interested in losing some. His blood pressure was 106/60 today. He states that his blood pressure at home has been in the 827M-786L systolic at home. He has not had any chest pain or dizzyness in rehab.      Expected Outcomes Short: Continue to work with doctor about BP symptoms Long: Conitnue to montior risk factors. Short: continue to exercise to keep blood pressure readings adequate. Long: maintain blood pressure readings at home.               Core Components/Risk Factors/Patient Goals at Discharge (Final Review):   Goals and Risk Factor Review - 03/12/22 1352       Core Components/Risk Factors/Patient Goals Review   Personal Goals Review Weight Management/Obesity;Hypertension    Review Cody Pollard has been checking his blood pressure at home and has had good readings in [place]. He has not lost weight and still is interested in losing some. His blood pressure was 106/60 today. He states that his blood pressure at home has been in the 544B-201E systolic at home. He has not had any chest pain or dizzyness in rehab.    Expected Outcomes Short: continue to exercise to keep blood pressure readings adequate. Long: maintain blood pressure readings at home.             ITP Comments:  ITP Comments     Row Name 01/16/22 1536 01/28/22 0928 01/29/22 1324 02/09/22 1457 02/25/22 1321   ITP Comments Initial telephone orientation completed. Diagnosis can be found in [facility] 6/30. EP orientation scheduled for Wednesday 8/9 at 8am. Completed 6MWT and gym orientation. Initial ITP created and sent for review to Dr. Emily Filbert, Medical Director. First full day of exercise!  Patient was oriented to gym and equipment including functions, settings, policies, and procedures.  Patient's individual exercise prescription and treatment plan  were reviewed.  All starting workloads were established based on the results of the 6 minute walk test done at initial orientation visit.  The plan for exercise progression was also introduced and progression will be customized based on patient's performance and goals. Completed initial RD consultation 30 Day review completed. Medical Director ITP review done, changes made as directed, and signed approval by Medical Director.    [place] Name 03/25/22 0722           ITP Comments 30 Day review completed. Medical Director ITP review done, changes made as directed, and signed approval by Medical Director.                Comments:

## 2022-03-25 NOTE — Patient Instructions (Addendum)
Discharge Patient Instructions  Patient Details  Name: Cody GRAJALES Sr. MRN: [id number] Date of Birth: [birth year] Referring Provider:  Kirk Ruths, MD   Number of Visits: 21  Reason for Discharge:  Patient reached a stable level of exercise. Patient independent in their exercise. Patient has met program and personal goals.  Smoking History:  Social History   Tobacco Use  Smoking Status Never  Smokeless Tobacco Never    Diagnosis:  Heart failure, chronic systolic (HCC)  Initial Exercise Prescription:  Initial Exercise Prescription - 01/28/22 0900       Date of Initial Exercise RX and Referring Provider   Date 01/28/22    Referring Provider Lujean Amel MD      Oxygen   Maintain Oxygen Saturation 88% or higher      Treadmill   MPH 2.8    Grade 1    Minutes 15    METs 3.53      NuStep   Level 3    SPM 80    Minutes 15    METs 2.5      REL-XR   Level 3    Speed 50    Minutes 15    METs 2.5      Track   Laps 40    Minutes 15    METs 3.18      Prescription Details   Frequency (times per week) 3    Duration Progress to 30 minutes of continuous aerobic without signs/symptoms of physical distress      Intensity   THRR 40-80% of Max Heartrate 84-121    Ratings of Perceived Exertion 11-13    Perceived Dyspnea 0-4      Progression   Progression Continue to progress workloads to maintain intensity without signs/symptoms of physical distress.      Resistance Training   Training Prescription Yes    Weight 4 lb    Reps 10-15             Discharge Exercise Prescription (Final Exercise Prescription Changes):  Exercise Prescription Changes - 03/17/22 1500       Response to Exercise   Blood Pressure (Admit) 106/60    Blood Pressure (Exit) 124/64    Heart Rate (Admit) 51 bpm    Heart Rate (Exercise) 100 bpm    Heart Rate (Exit) 57 bpm    Rating of Perceived Exertion (Exercise) 13    Symptoms none    Duration Continue with 30  min of aerobic exercise without signs/symptoms of physical distress.    Intensity THRR unchanged      Progression   Progression Continue to progress workloads to maintain intensity without signs/symptoms of physical distress.    Average METs 3.49      Resistance Training   Training Prescription Yes    Weight 4 lb    Reps 10-15      Interval Training   Interval Training No      Treadmill   MPH 3    Grade 3    Minutes 15    METs 4.54      NuStep   Level 3    Minutes 15    METs 3.3      Recumbant Elliptical   Level 3    Minutes 15    METs 3.3      Track   Laps 90    Minutes 30    METs 3.71      Home Exercise Plan   Plans to  continue exercise at Home (comment)   walking, weights   Frequency Add 3 additional days to program exercise sessions.    Initial Home Exercises Provided 02/05/22      Oxygen   Maintain Oxygen Saturation 88% or higher             Functional Capacity:  6 Minute Walk     Row Name 01/28/22 0929 03/25/22 1347       6 Minute Walk   Phase Initial Discharge    Distance 1520 feet 1660 feet    Distance % Change -- 9.2 %    Distance Feet Change -- 140 ft    Walk Time 6 minutes 6 minutes    # of Rest Breaks 0 0    MPH 2.88 3.14    METS 2.45 2.8    RPE 9 11    VO2 Peak 8.58 9.79    Symptoms No No    Resting HR 47 bpm 52 bpm    Resting BP 116/60 130/66    Resting Oxygen Saturation  97 % 97 %    Exercise Oxygen Saturation  during 6 min walk 96 % 94 %    Max Ex. HR 67 bpm 75 bpm    Max Ex. BP 134/72 138/64    2 Minute Post BP 124/56 --             Nutrition & Weight - Outcomes:  Pre Biometrics - 01/28/22 0937       Pre Biometrics   Height 5' 11"  (1.803 m)    Weight 201 lb 12.8 oz (91.5 kg)    BMI (Calculated) 28.16    Single Leg Stand 13.7 seconds             Post Biometrics - 03/25/22 1349        Post  Biometrics   Height 5' 11"  (1.803 m)    Weight 201 lb 11.2 oz (91.5 kg)    BMI (Calculated) 28.14    Single Leg  Stand 30 seconds             Nutrition:  Nutrition Therapy & Goals - 02/09/22 1458       Nutrition Therapy   Diet Heart healthy, low Na    Drug/Food Interactions Statins/Certain Fruits    Protein (specify units) 105-110g    Fiber 30 grams    Whole Grain Foods 3 servings    Saturated Fats 16 max. grams    Fruits and Vegetables 8 servings/day    Sodium 2 grams      Personal Nutrition Goals   Nutrition Goal ST: add vegetables or fruit to breakfast, have a half whole wheat sandwich and peanut butter for a snack, practice MyPlate guidelines LT: limit saturated fat < 16g/day, limit added sugar < 36g/day, follow MyPlate guidelines    Comments 81 y.o. M admitted to cardiac rehab s/p HF, chronic systolic. PMHx includes HTN, pre-diabetes, HLD, CAD, GERD. Relevant medications include farxiga, crestor, furosemide.  PYP Score: 64. Vegetables & Fruits 7/12. Breads, Grains & Cereals 7/12. Red & Processed Meat 10/12. Poultry 2/2. Fish & Shellfish 1/4. Beans, Nuts & Seeds 3/4. Milk & Dairy Foods 6/6. Toppings, Oils, Seasonings & Salt 14/20. Sweets, Snacks & Restaurant Food 6/14. Beverages 8/10.  B: eggs with biscuit, coffee L: pack of nabs D: meat (usually chicken) with vegetables and sometimes brown rice. Drinks: water, 2% milk (2-3 glasses). He reports they use olive oil with cooking and sometimes butter, they do not salt  their food; instead using Mrs. Dash and other non-salt seasonings to flavor food. He reports having a small lunch due to work - discussed other more nutrient dense snacks such as 1/2 whole wheat sandwich with peanut butter. Suggested adding fruit or vegetables ot breakfast - he reports liking western omelettes and is excited to try adding in vegetables to his eggs. He reports that his wife keeps him in line and his daughter gave him a heart healthy cookbook which they have started to use. Discussed heart healthy eating.      Intervention Plan   Intervention Prescribe, educate and  counsel regarding individualized specific dietary modifications aiming towards targeted core components such as weight, hypertension, lipid management, diabetes, heart failure and other comorbidities.    Expected Outcomes Short Term Goal: Understand basic principles of dietary content, such as calories, fat, sodium, cholesterol and nutrients.;Short Term Goal: A plan has been developed with personal nutrition goals set during dietitian appointment.;Long Term Goal: Adherence to prescribed nutrition plan.            Goals reviewed with patient; copy given to patient.

## 2022-04-06 ENCOUNTER — Other Ambulatory Visit: Payer: Self-pay | Admitting: Family

## 2022-04-06 MED ORDER — DAPAGLIFLOZIN PROPANEDIOL 10 MG PO TABS: 10.0000 mg | ORAL_TABLET | Freq: Every day | ORAL | 3 refills | Status: DC

## 2022-04-07 ENCOUNTER — Other Ambulatory Visit: Payer: Self-pay | Admitting: Family

## 2022-04-07 MED ORDER — DAPAGLIFLOZIN PROPANEDIOL 10 MG PO TABS
10.0000 mg | ORAL_TABLET | Freq: Every day | ORAL | 3 refills | Status: DC
Start: 2022-04-07 — End: 2022-05-04

## 2022-04-15 DIAGNOSIS — G4733 Obstructive sleep apnea (adult) (pediatric): Secondary | ICD-10-CM | POA: Diagnosis not present

## 2022-04-20 ENCOUNTER — Ambulatory Visit: Payer: PPO | Admitting: Family

## 2022-04-26 NOTE — Progress Notes (Unsigned)
Patient ID: Cody Caraway Sr., male    DOB: 31-Oct-1940, 81 y.o.   MRN: [id number]   Cody Pollard is a 81 y/o male with a history of atrial fibrillation, hyperlipidemia, HTN, BPH, kidney stones, trigeminal neuralgia, sleep apnea and chronic heart failure.   Echo report from 01/23/22 showed an EF of 50-55% along with mildly elevated PA pressure, mild LAE and mild Cody. Echo report from 08/25/21 reviewed and showed an EF of 30%. Echo report from 06/12/21 reviewed and showed an EF of 25-30%  RHC/LHC done 06/13/21 showed: Prox LAD lesion is 50% stenosed.   There is severe left ventricular systolic dysfunction.   LV end diastolic pressure is mildly elevated.   The left ventricular ejection fraction is less than 25% by visual estimate.   No significant obstructive coronary disease   Normal right heart pressures   Dilated nonischemic cardiomyopathy  Has not been admitted or been in the ED in the last 6 months.   He presents today for a follow up visit with a chief complaint of minimal fatigue with moderate exertion. Describes this as chronic in nature having been present for several years. He has associated dizziness and rare chest discomfort that is fleeting in nature. He denies any difficulty sleeping, abdominal distention, palpitations, pedal edema, shortness of breath, cough or weight gain.   Continues to work part-time at [employer] at the front door.   Past Medical History:  Diagnosis Date   Arrhythmia    atrial fibrillation   BPH (benign prostatic hyperplasia)    CHF (congestive heart failure) (HCC)    Complication of anesthesia    Edema    MILD FEET/ANKLES OCCAS   History of kidney stones    Hyperlipidemia    Hypertension    Neuromuscular disorder (HCC)    TRIGEMINAL NEURALGIA   Palpitations    PONV (postoperative nausea and vomiting)    Sleep apnea    DOES NOT USE CPAP   Vertigo    Past Surgical History:  Procedure Laterality Date   CATARACT EXTRACTION W/PHACO Left 09/23/2017    Procedure: CATARACT EXTRACTION PHACO AND INTRAOCULAR LENS PLACEMENT ([place]);  Surgeon: Eulogio Bear, MD;  Location: [facility] ORS;  Service: Ophthalmology;  Laterality: Left;  [place] 00:19 AP% 8.5 CDE 1.64 Fluid pak lot # [id number]   COLONOSCOPY WITH PROPOFOL N/A 05/06/2016   Procedure: COLONOSCOPY WITH PROPOFOL;  Surgeon: Manya Silvas, MD;  Location: [facility] ENDOSCOPY;  Service: Endoscopy;  Laterality: N/A;   HERNIA REPAIR     RIGHT/LEFT HEART CATH AND CORONARY ANGIOGRAPHY N/A 06/13/2021   Procedure: RIGHT/LEFT HEART CATH AND CORONARY ANGIOGRAPHY and possible PCI and stent;  Surgeon: Yolonda Kida, MD;  Location: [facility] LAB;  Service: Cardiovascular;  Laterality: N/A;   No family history on file. Social History   Tobacco Use   Smoking status: Never   Smokeless tobacco: Never  Substance Use Topics   Alcohol use: No   Allergies  Allergen Reactions   Penicillins    Prior to Admission medications   Medication Sig Start Date End Date Taking? Authorizing Provider  aspirin EC 81 MG tablet Take 81 mg by mouth daily.   Yes [provider]  carvedilol (COREG) 3.125 MG tablet Take 3.125 mg by mouth 2 (two) times daily with a meal.   Yes [provider]  dapagliflozin propanediol (FARXIGA) 10 MG TABS tablet Take 1 tablet (10 mg total) by mouth daily before breakfast. 04/07/22  Yes Alisa Graff, FNP  dutasteride (AVODART) 0.5 MG capsule Take 0.5 mg by mouth daily. 12/24/13  Yes [provider]  furosemide (LASIX) 20 MG tablet Take 20 mg by mouth daily.   Yes [provider]  gabapentin (NEURONTIN) 300 MG capsule Take 300 mg by mouth 3 (three) times daily. 04/04/21  Yes [provider]  rosuvastatin (CRESTOR) 10 MG tablet Take 10 mg by mouth daily.   Yes [provider]  sacubitril-valsartan (ENTRESTO) 24-26 MG Take 1 tablet by mouth 2 (two) times daily. 10/06/21  Yes Alisa Graff, FNP    Review of Systems  Constitutional:   Positive for fatigue. Negative for appetite change.  HENT:  Negative for congestion, postnasal drip and sore throat.   Eyes: Negative.   Respiratory:  Negative for cough, chest tightness and shortness of breath.   Cardiovascular:  Positive for chest pain (intermittently dull ache at times). Negative for palpitations and leg swelling.  Gastrointestinal:  Negative for abdominal distention and abdominal pain.  Endocrine: Negative.   Genitourinary: Negative.   Musculoskeletal:  Negative for back pain and neck pain.  Skin: Negative.   Allergic/Immunologic: Negative.   Neurological:  Positive for dizziness and light-headedness. Negative for headaches.  Hematological:  Negative for adenopathy. Does not bruise/bleed easily.  Psychiatric/Behavioral:  Negative for dysphoric mood and sleep disturbance (wearing CPAP). The patient is not nervous/anxious.    Vitals:   04/27/22 1341  BP: (!) 129/58  Pulse: (!) 55  Resp: 14  SpO2: 98%  Weight: 204 lb 8 oz (92.8 kg)  Height: '5\' 11"'$  (1.803 m)   Wt Readings from Last 3 Encounters:  04/27/22 204 lb 8 oz (92.8 kg)  03/25/22 201 lb 11.2 oz (91.5 kg)  01/28/22 201 lb 12.8 oz (91.5 kg)   Lab Results  Component Value Date   CREATININE 1.11 11/12/2021   CREATININE 1.15 10/08/2021   CREATININE 0.86 06/14/2021   Physical Exam Vitals and nursing note reviewed.  Constitutional:      Appearance: Normal appearance.  HENT:     Head: Normocephalic and atraumatic.  Cardiovascular:     Rate and Rhythm: Regular rhythm. Bradycardia present.  Pulmonary:     Effort: Pulmonary effort is normal. No respiratory distress.     Breath sounds: No wheezing or rales.  Abdominal:     General: There is no distension.     Palpations: Abdomen is soft.  Musculoskeletal:        General: No tenderness.     Cervical back: Normal range of motion and neck supple.     Right lower leg: No edema.     Left lower leg: No edema.  Skin:    General: Skin is warm and dry.   Neurological:     General: No focal deficit present.     Mental Status: He is alert and oriented to person, place, and time.  Psychiatric:        Mood and Affect: Mood normal.        Behavior: Behavior normal.        Thought Content: Thought content normal.     Assessment & Plan:  1: Chronic heart failure with now preserved ejection fraction with structural changes (LAE)- - NYHA class II - euvolemic today - weighing daily; reminded to call for an overnight weight gain of > 2 pounds or a weekly weight gain of > 5 pounds - weight down ~ 3 pound from last visit here 4 months ago - not adding salt and has been looking  at food labels - saw cardiology Clayborn Bigness) 03/03/22 - on GDMT of carvedilol, entresto & farxiga  - adhering to 64 ounces/day of fluid - did a couple of months of cardiac rehab but then it became cost prohibitive; he is walking 1-2 miles a day at his house - BNP 11/12/21 was 459.9 - has received his flu vaccine for this season  2: HTN- - BP looks good (129/58) - saw PCP Ouida Sills) 11/19/21 - BMP 11/12/21 reviewed and showed sodium 139, potassium 4.1, creatinine 1.11 and GFR >60  3: PAF-  - saw EP Quentin Ore) 01/14/22  4: Sleep apnea- - wearing CPAP 5-6 hours/ night  - reports sleeping well   Medication bottles reviewed.   Return in 6 months, sooner if needed.

## 2022-04-27 ENCOUNTER — Ambulatory Visit: Payer: PPO | Admitting: Family

## 2022-04-27 ENCOUNTER — Encounter: Payer: Self-pay | Admitting: Family

## 2022-04-27 ENCOUNTER — Ambulatory Visit: Payer: PPO | Attending: Family | Admitting: Family

## 2022-04-27 VITALS — BP 129/58 | HR 55 | Resp 14 | Ht 71.0 in | Wt 204.5 lb

## 2022-04-27 DIAGNOSIS — G4730 Sleep apnea, unspecified: Secondary | ICD-10-CM | POA: Diagnosis not present

## 2022-04-27 DIAGNOSIS — Z7984 Long term (current) use of oral hypoglycemic drugs: Secondary | ICD-10-CM | POA: Diagnosis not present

## 2022-04-27 DIAGNOSIS — G4733 Obstructive sleep apnea (adult) (pediatric): Secondary | ICD-10-CM | POA: Diagnosis not present

## 2022-04-27 DIAGNOSIS — I110 Hypertensive heart disease with heart failure: Secondary | ICD-10-CM | POA: Diagnosis not present

## 2022-04-27 DIAGNOSIS — I509 Heart failure, unspecified: Secondary | ICD-10-CM | POA: Insufficient documentation

## 2022-04-27 DIAGNOSIS — I10 Essential (primary) hypertension: Secondary | ICD-10-CM

## 2022-04-27 DIAGNOSIS — I5032 Chronic diastolic (congestive) heart failure: Secondary | ICD-10-CM

## 2022-04-27 DIAGNOSIS — Z79899 Other long term (current) drug therapy: Secondary | ICD-10-CM | POA: Diagnosis not present

## 2022-04-27 DIAGNOSIS — E785 Hyperlipidemia, unspecified: Secondary | ICD-10-CM | POA: Diagnosis not present

## 2022-04-27 DIAGNOSIS — I480 Paroxysmal atrial fibrillation: Secondary | ICD-10-CM | POA: Diagnosis not present

## 2022-04-27 NOTE — Patient Instructions (Signed)
Continue weighing daily and call for an overnight weight gain of 3 pounds or more or a weekly weight gain of more than 5 pounds.   If you have voicemail, please make sure your mailbox is cleaned out so that we may leave a message and please make sure to listen to any voicemails.     

## 2022-05-04 ENCOUNTER — Telehealth: Payer: Self-pay | Admitting: Family

## 2022-05-04 ENCOUNTER — Other Ambulatory Visit: Payer: Self-pay | Admitting: Family

## 2022-05-04 MED ORDER — SACUBITRIL-VALSARTAN 24-26 MG PO TABS: 1.0000 | ORAL_TABLET | Freq: Two times a day (BID) | ORAL | 3 refills | Status: DC

## 2022-05-04 MED ORDER — DAPAGLIFLOZIN PROPANEDIOL 10 MG PO TABS: 10.0000 mg | ORAL_TABLET | Freq: Every day | ORAL | 3 refills | Status: DC

## 2022-05-04 NOTE — Telephone Encounter (Signed)
Faxed re enrollment paperwork for farxiga and entresto 05/04/22   Daisee Centner, NT

## 2022-05-04 NOTE — Progress Notes (Signed)
Entresto & [employer] for patient assistance

## 2022-05-20 DIAGNOSIS — I5022 Chronic systolic (congestive) heart failure: Secondary | ICD-10-CM | POA: Diagnosis not present

## 2022-05-20 DIAGNOSIS — R7303 Prediabetes: Secondary | ICD-10-CM | POA: Diagnosis not present

## 2022-05-20 DIAGNOSIS — I10 Essential (primary) hypertension: Secondary | ICD-10-CM | POA: Diagnosis not present

## 2022-05-20 DIAGNOSIS — E7800 Pure hypercholesterolemia, unspecified: Secondary | ICD-10-CM | POA: Diagnosis not present

## 2022-05-26 DIAGNOSIS — I5022 Chronic systolic (congestive) heart failure: Secondary | ICD-10-CM | POA: Diagnosis not present

## 2022-05-26 DIAGNOSIS — I10 Essential (primary) hypertension: Secondary | ICD-10-CM | POA: Diagnosis not present

## 2022-05-26 DIAGNOSIS — I779 Disorder of arteries and arterioles, unspecified: Secondary | ICD-10-CM | POA: Diagnosis not present

## 2022-05-26 DIAGNOSIS — R7303 Prediabetes: Secondary | ICD-10-CM | POA: Diagnosis not present

## 2022-05-26 DIAGNOSIS — E039 Hypothyroidism, unspecified: Secondary | ICD-10-CM | POA: Diagnosis not present

## 2022-05-26 DIAGNOSIS — E7800 Pure hypercholesterolemia, unspecified: Secondary | ICD-10-CM | POA: Diagnosis not present

## 2022-05-26 DIAGNOSIS — Z0000 Encounter for general adult medical examination without abnormal findings: Secondary | ICD-10-CM | POA: Diagnosis not present

## 2022-06-03 ENCOUNTER — Telehealth: Payer: Self-pay | Admitting: Family

## 2022-06-03 NOTE — Telephone Encounter (Signed)
Notified patient that his application for [employer]ade was approved through [employer] and medication has been shipped.   Cody Pollard, NT

## 2022-06-05 ENCOUNTER — Telehealth: Payer: Self-pay | Admitting: Family

## 2022-06-05 NOTE — Telephone Encounter (Signed)
Patient was approved for [employer] patient assistance until 06/22/23.   Cody Pollard, NT

## 2022-07-17 DIAGNOSIS — D3131 Benign neoplasm of right choroid: Secondary | ICD-10-CM | POA: Diagnosis not present

## 2022-07-17 DIAGNOSIS — H2511 Age-related nuclear cataract, right eye: Secondary | ICD-10-CM | POA: Diagnosis not present

## 2022-08-11 DIAGNOSIS — G4733 Obstructive sleep apnea (adult) (pediatric): Secondary | ICD-10-CM | POA: Diagnosis not present

## 2022-08-17 ENCOUNTER — Ambulatory Visit: Payer: PPO | Admitting: Podiatry

## 2022-08-17 ENCOUNTER — Encounter: Payer: Self-pay | Admitting: Podiatry

## 2022-08-17 VITALS — BP 139/73 | HR 63

## 2022-08-17 DIAGNOSIS — Q828 Other specified congenital malformations of skin: Secondary | ICD-10-CM

## 2022-08-17 NOTE — Progress Notes (Signed)
  Subjective:  Patient ID: Cody Caraway Sr., male    DOB: 1940-12-25,  MRN: [id number]  Chief Complaint  Patient presents with   Foot Pain    "I stepped on a nail about the first of October.  I had it treated and had a Tetanus shot.  Now I have a knot in that area.  It gets uncomfortable at times." N - callus L - 2nd left met D - 2 mos O - gradually C - tender A - walk a lot T - none    82 y.o. male presents with the above complaint. History confirmed with patient.   Objective:  Physical Exam: warm, good capillary refill, no trophic changes or ulcerative lesions, normal DP and PT pulses, normal sensory exam, and left foot submetatarsal 2 porokeratosis Assessment:   1. Porokeratosis      Plan:  Patient was evaluated and treated and all questions answered.  -educated on etiology and tx options of lesion -debrided sharply with chisel to remove core -salinocaine applied with offloading aperture pad. Recommended he treat with these at home until lesion resolves -return PRN   Return if symptoms worsen or fail to improve.

## 2022-08-17 NOTE — Patient Instructions (Signed)
Salicylic acid [id number] cream or medicated pads will get rid of the lesion  You can get foam or felt padding on [employer] to take the pressure off the area

## 2022-08-24 DIAGNOSIS — E039 Hypothyroidism, unspecified: Secondary | ICD-10-CM | POA: Diagnosis not present

## 2022-09-03 DIAGNOSIS — J9601 Acute respiratory failure with hypoxia: Secondary | ICD-10-CM | POA: Diagnosis not present

## 2022-09-03 DIAGNOSIS — G4733 Obstructive sleep apnea (adult) (pediatric): Secondary | ICD-10-CM | POA: Diagnosis not present

## 2022-09-03 DIAGNOSIS — I10 Essential (primary) hypertension: Secondary | ICD-10-CM | POA: Diagnosis not present

## 2022-09-03 DIAGNOSIS — E782 Mixed hyperlipidemia: Secondary | ICD-10-CM | POA: Diagnosis not present

## 2022-09-03 DIAGNOSIS — I429 Cardiomyopathy, unspecified: Secondary | ICD-10-CM | POA: Diagnosis not present

## 2022-09-03 DIAGNOSIS — I493 Ventricular premature depolarization: Secondary | ICD-10-CM | POA: Diagnosis not present

## 2022-09-03 DIAGNOSIS — I4891 Unspecified atrial fibrillation: Secondary | ICD-10-CM | POA: Diagnosis not present

## 2022-09-03 DIAGNOSIS — I779 Disorder of arteries and arterioles, unspecified: Secondary | ICD-10-CM | POA: Diagnosis not present

## 2022-09-03 DIAGNOSIS — I4892 Unspecified atrial flutter: Secondary | ICD-10-CM | POA: Diagnosis not present

## 2022-09-03 DIAGNOSIS — I5022 Chronic systolic (congestive) heart failure: Secondary | ICD-10-CM | POA: Diagnosis not present

## 2022-10-27 ENCOUNTER — Other Ambulatory Visit: Payer: Self-pay

## 2022-10-27 ENCOUNTER — Telehealth: Payer: Self-pay

## 2022-10-27 MED ORDER — SACUBITRIL-VALSARTAN 24-26 MG PO TABS: 1.0000 | ORAL_TABLET | Freq: Two times a day (BID) | ORAL | 3 refills | Status: DC

## 2022-10-27 NOTE — Telephone Encounter (Signed)
Patient called and spoke with RN Pamalee Leyden to determine what [employer] patient assistance program needed to refill his Entresto. I spoke with [employer] patient assistance program and was informed that the patient could not get another refill until 11/09/22. Informed patient of this information and made sure he had enough medication to last until he is able to get the refill.

## 2022-11-03 ENCOUNTER — Ambulatory Visit: Payer: PPO | Attending: Family | Admitting: Family

## 2022-11-03 ENCOUNTER — Encounter: Payer: Self-pay | Admitting: Family

## 2022-11-03 VITALS — BP 132/79 | HR 55 | Wt 201.6 lb

## 2022-11-03 DIAGNOSIS — I10 Essential (primary) hypertension: Secondary | ICD-10-CM

## 2022-11-03 DIAGNOSIS — G4730 Sleep apnea, unspecified: Secondary | ICD-10-CM | POA: Diagnosis not present

## 2022-11-03 DIAGNOSIS — I5022 Chronic systolic (congestive) heart failure: Secondary | ICD-10-CM | POA: Diagnosis not present

## 2022-11-03 DIAGNOSIS — I5032 Chronic diastolic (congestive) heart failure: Secondary | ICD-10-CM | POA: Diagnosis not present

## 2022-11-03 DIAGNOSIS — I480 Paroxysmal atrial fibrillation: Secondary | ICD-10-CM

## 2022-11-03 DIAGNOSIS — E785 Hyperlipidemia, unspecified: Secondary | ICD-10-CM | POA: Diagnosis not present

## 2022-11-03 DIAGNOSIS — I493 Ventricular premature depolarization: Secondary | ICD-10-CM

## 2022-11-03 DIAGNOSIS — N400 Enlarged prostate without lower urinary tract symptoms: Secondary | ICD-10-CM | POA: Insufficient documentation

## 2022-11-03 DIAGNOSIS — Z79899 Other long term (current) drug therapy: Secondary | ICD-10-CM | POA: Insufficient documentation

## 2022-11-03 DIAGNOSIS — I110 Hypertensive heart disease with heart failure: Secondary | ICD-10-CM | POA: Diagnosis not present

## 2022-11-03 DIAGNOSIS — G500 Trigeminal neuralgia: Secondary | ICD-10-CM | POA: Insufficient documentation

## 2022-11-03 DIAGNOSIS — I420 Dilated cardiomyopathy: Secondary | ICD-10-CM | POA: Diagnosis not present

## 2022-11-03 DIAGNOSIS — G4733 Obstructive sleep apnea (adult) (pediatric): Secondary | ICD-10-CM

## 2022-11-03 DIAGNOSIS — Z87442 Personal history of urinary calculi: Secondary | ICD-10-CM | POA: Insufficient documentation

## 2022-11-03 DIAGNOSIS — I4891 Unspecified atrial fibrillation: Secondary | ICD-10-CM | POA: Insufficient documentation

## 2022-11-03 DIAGNOSIS — R001 Bradycardia, unspecified: Secondary | ICD-10-CM

## 2022-11-03 NOTE — Progress Notes (Unsigned)
PCP: Charissa Bash, MD (last seen 12/23) Primary Cardiologist: Dorothyann Peng, MD (last seen 03/24)  HPI:  Mr Slusser is a 82 y/o male with a history of atrial fibrillation, hyperlipidemia, HTN, BPH, kidney stones, trigeminal neuralgia, sleep apnea and chronic heart failure.   Echo 01/23/22: EF of 50-55% along with mildly elevated PA pressure, mild LAE and mild MR. Echo 08/25/21:  EF of 30%. Echo 06/12/21: EF of 25-30%  RHC/LHC 06/13/21: Prox LAD lesion is 50% stenosed.   There is severe left ventricular systolic dysfunction.   LV end diastolic pressure is mildly elevated.   The left ventricular ejection fraction is less than 25% by visual estimate.   No significant obstructive coronary disease   Normal right heart pressures   Dilated nonischemic cardiomyopathy  Has not been admitted or been in the ED in the last 6 months.   He presents today for a HF follow up visit with a chief complaint of minimal fatigue with moderate exertion. Chronic in nature. Has SOB, dizzy when bending over sometimes, lower leg swelling and chest discomfort along with this. He recently worked in the yard and pulled a very heavy limb and noted worse fatigue after that. He's been having some chest discomfort intermittently for ~ the last hour. Describes it as achy and uncomfortable. No actual chest pain, nausea, sweating or radiation.   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   ROS: All systems negative except as listed in HPI, PMH and Problem List.  SH:  Social History   Socioeconomic History   Marital status: Married    Spouse name: Not on file   Number of children: Not on file   Years of education: Not on file   Highest education level: Not on file  Occupational History   Not on file  Tobacco Use   Smoking status: Never   Smokeless tobacco: Never  Vaping Use   Vaping Use: Never used  Substance and Sexual Activity   Alcohol use: No   Drug use: No   Sexual activity: Not on file   Other Topics Concern   Not on file  Social History Narrative   Not on file   Social Determinants of Health   Financial Resource Strain: Not on file  Food Insecurity: Not on file  Transportation Needs: Not on file  Physical Activity: Not on file  Stress: Not on file  Social Connections: Not on file  Intimate Partner Violence: Not on file    FH: No family history on file.  Past Medical History:  Diagnosis Date   Arrhythmia    atrial fibrillation   BPH (benign prostatic hyperplasia)    CHF (congestive heart failure) (HCC)    Complication of anesthesia    Edema    MILD FEET/ANKLES OCCAS   History of kidney stones    Hyperlipidemia    Hypertension    Neuromuscular disorder (HCC)    TRIGEMINAL NEURALGIA   Palpitations    PONV (postoperative nausea and vomiting)    Sleep apnea    DOES NOT USE CPAP   Vertigo     Current Outpatient Medications  Medication Sig Dispense Refill   aspirin EC 81 MG tablet Take 81 mg by mouth daily.     carvedilol (COREG) 3.125 MG tablet Take 3.125 mg by mouth 2 (two) times daily with a meal.     dapagliflozin propanediol (FARXIGA) 10 MG TABS tablet Take 1 tablet (10 mg total) by mouth daily before breakfast. 90 tablet  3   dutasteride (AVODART) 0.5 MG capsule Take 0.5 mg by mouth daily.     furosemide (LASIX) 20 MG tablet Take 20 mg by mouth daily.     gabapentin (NEURONTIN) 300 MG capsule Take 300 mg by mouth 3 (three) times daily.     rosuvastatin (CRESTOR) 10 MG tablet Take 10 mg by mouth daily.     sacubitril-valsartan (ENTRESTO) 24-26 MG Take 1 tablet by mouth 2 (two) times daily. 180 tablet 3   No current facility-administered medications for this visit.   Vitals:   11/03/22 1334  BP: 132/79  Pulse: (!) 55  SpO2: 98%  Weight: 201 lb 9.6 oz (91.4 kg)   Wt Readings from Last 3 Encounters:  11/03/22 201 lb 9.6 oz (91.4 kg)  04/27/22 204 lb 8 oz (92.8 kg)  03/25/22 201 lb 11.2 oz (91.5 kg)   Lab Results  Component Value Date    CREATININE 1.11 11/12/2021   CREATININE 1.15 10/08/2021   CREATININE 0.86 06/14/2021   PHYSICAL EXAM:  General:  Well appearing. No resp difficulty HEENT: normal Neck: supple. JVP flat. Carotids 2+ bilaterally; no bruits. No lymphadenopathy or thryomegaly appreciated. Cor: PMI normal. Regular rhythm, bradycardic. No rubs, gallops or murmurs. Lungs: clear Abdomen: soft, nontender, nondistended. No hepatosplenomegaly. No bruits or masses.  Extremities: no cyanosis, clubbing, rash, 1+ pitting edema bilateral lower legs. Neuro: alert & orientedx3, cranial nerves grossly intact. Moves all 4 extremities w/o difficulty. Affect pleasant.  ReDs: 35%  ECG: SB with PVC, nonspecific ST & T wave abnormality; unchanged from previous EKG 12/22   ASSESSMENT & PLAN:  1: NICM with preserved ejection fraction with structural changes (LAE)- - NYHA class II - euvolemic today - etiology likely HTN - weighing daily; reminded to call for an overnight weight gain of > 2 pounds or a weekly weight gain of > 5 pounds - weight down 3 pounds from last visit here 6 months ago - Echo 01/23/22: EF of 50-55% along with mildly elevated PA pressure, mild LAE and mild MR.  - Echo 08/25/21:  EF of 30%.  - Echo 06/12/21: EF of 25-30% - RHC/LHC 06/13/21: Prox LAD lesion is 50% stenosed.   There is severe left ventricular systolic dysfunction.   LV end diastolic pressure is mildly elevated.   The left ventricular ejection fraction is less than 25% by visual estimate.   No significant obstructive coronary disease   Normal right heart pressures   Dilated nonischemic cardiomyopathy - not adding salt and has been looking at food labels - saw cardiology Juliann Pares) 03/24 - Reds clip reading today was 35%; advised him to take an additional 20mg  furosemide for 2 days - continue carvedilol 3.125mg  BID - continue entresto 24/26mg  BID - continue farxiga 10mg  daily - adhering to 64 ounces/day of fluid - did a couple of months  of cardiac rehab but then it became cost prohibitive; he is walking 1-2 miles a day at his house - BNP 11/12/21 was 459.9 - PharmD reconciled meds w/ patient  2: HTN- - BP 132/79 - saw PCP Dareen Piano) 12/23 - BMP 05/20/22 reviewed and showed sodium 143, potassium 4.6, creatinine 1.0 and GFR 76; has upcoming lab work with PCP in ~ 2 weeks  3: PAF-  - saw EP Lalla Brothers) 01/14/22 - regular rhythm today  4: Sleep apnea- - wearing CPAP 5-6 hours/ night  - reports sleeping well  5: PVC's- - EKG today: SB with PVC, nonspecific ST/T wave changes (unchanged from previous one)  Return  in 6 months, sooner if needed

## 2022-11-03 NOTE — Progress Notes (Signed)
   11/03/22 1359  ReDS Vest / Clip  Station Marker C  Ruler Value 31  ReDS Value Range < 36  ReDS Actual Value 35

## 2022-11-03 NOTE — Patient Instructions (Signed)
Take an extra furosemide for the next 2 days.

## 2022-11-03 NOTE — Progress Notes (Signed)
[facility] HEART FAILURE CLINIC - Pharmacist Note  Cody WEISHEIT Sr. is a 82 y.o. male with HFimpEF (baseline EF <40% with a follow-up EF >40%) presenting to the Heart Failure Clinic for follow up. Patient reports no significant adverse drug events and no barriers to medication access. He does report occasional dizziness while bending. He checks his weight daily and reports that it has been stable with small 1-2 lb variations. He reports some increased SOB and mild leg swelling today. He reports being relatively active, walking 1 mile at least 3 days a week. He reports adherence to recommended fluid restriction.   Recent ED Visit (past 6 months): none  Guideline-Directed Medical Therapy/Evidence Based Medicine ACE/ARB/ARNI: Sacubitril/valsartan 24/26 mg BID Beta Blocker: Carvedilol 3.125 mg twice daily Aldosterone Antagonist:  none Diuretic: Furosemide 20 mg daily SGLT2i: Dapagliflozin 10 mg daily  Adherence Assessment Do you ever forget to take your medication? [] Yes [x] No  Do you ever skip doses due to side effects? [] Yes [x] No  Do you have trouble affording your medicines? [] Yes [x] No  Are you ever unable to pick up your medication due to transportation difficulties? [] Yes [x] No  Do you ever stop taking your medications because you don't believe they are helping? [] Yes [x] No  Do you check your weight daily? [x] Yes [] No  Adherence strategy: pill box Barriers to obtaining medications: none reported   Diagnostics ECHO: Date 01/23/2022, EF 50-55%, no RWMA, G1DD Cath: Date 06/13/2021, EF <25%, LVEDP mildly elevated, severe LV dysfunction   Vitals    11/03/2022    1:34 PM 08/17/2022    2:55 PM 04/27/2022    1:41 PM  Vitals with BMI  Height   5\' 11"   Weight 201 lbs 10 oz  204 lbs 8 oz  BMI   28.53  Systolic 132 139 409  Diastolic 79 73 58  Pulse 55 63 55     Recent Labs    Latest Ref Rng & Units 11/12/2021    1:01 PM 10/08/2021    4:35 PM 06/14/2021    5:38 AM  BMP  Glucose  70 - 99 mg/dL 811  91  914   BUN 8 - 23 mg/dL 22  20  30    Creatinine 0.61 - 1.24 mg/dL 7.82  9.56  2.13   Sodium 135 - 145 mmol/L 139  138  138   Potassium 3.5 - 5.1 mmol/L 4.1  4.3  3.5   Chloride 98 - 111 mmol/L 110  106  104   CO2 22 - 32 mmol/L 22  25  28    Calcium 8.9 - 10.3 mg/dL 8.8  9.1  9.1     Past Medical History Past Medical History:  Diagnosis Date   Arrhythmia    atrial fibrillation   BPH (benign prostatic hyperplasia)    CHF (congestive heart failure) (HCC)    Complication of anesthesia    Edema    MILD FEET/ANKLES OCCAS   History of kidney stones    Hyperlipidemia    Hypertension    Neuromuscular disorder (HCC)    TRIGEMINAL NEURALGIA   Palpitations    PONV (postoperative nausea and vomiting)    Sleep apnea    DOES NOT USE CPAP   Vertigo     Plan ReDS 35 today  Increase lasix to 40 mg daily x2 days Continue current regimen per NP Follow up labs as scheduled next week  Time spent: 10 minutes  Celene Squibb, PharmD PGY1 Pharmacy Resident 11/03/2022 2:18 PM

## 2022-11-04 ENCOUNTER — Encounter: Payer: Self-pay | Admitting: Family

## 2022-11-10 DIAGNOSIS — G4733 Obstructive sleep apnea (adult) (pediatric): Secondary | ICD-10-CM | POA: Diagnosis not present

## 2022-11-18 DIAGNOSIS — I5022 Chronic systolic (congestive) heart failure: Secondary | ICD-10-CM | POA: Diagnosis not present

## 2022-11-18 DIAGNOSIS — E039 Hypothyroidism, unspecified: Secondary | ICD-10-CM | POA: Diagnosis not present

## 2022-11-18 DIAGNOSIS — I779 Disorder of arteries and arterioles, unspecified: Secondary | ICD-10-CM | POA: Diagnosis not present

## 2022-11-18 DIAGNOSIS — R7303 Prediabetes: Secondary | ICD-10-CM | POA: Diagnosis not present

## 2022-11-26 DIAGNOSIS — I5022 Chronic systolic (congestive) heart failure: Secondary | ICD-10-CM | POA: Diagnosis not present

## 2022-11-26 DIAGNOSIS — E7800 Pure hypercholesterolemia, unspecified: Secondary | ICD-10-CM | POA: Diagnosis not present

## 2022-11-26 DIAGNOSIS — I779 Disorder of arteries and arterioles, unspecified: Secondary | ICD-10-CM | POA: Diagnosis not present

## 2022-11-26 DIAGNOSIS — R7303 Prediabetes: Secondary | ICD-10-CM | POA: Diagnosis not present

## 2022-11-26 DIAGNOSIS — E038 Other specified hypothyroidism: Secondary | ICD-10-CM | POA: Diagnosis not present

## 2022-11-26 DIAGNOSIS — I10 Essential (primary) hypertension: Secondary | ICD-10-CM | POA: Diagnosis not present

## 2023-03-08 DIAGNOSIS — I4892 Unspecified atrial flutter: Secondary | ICD-10-CM | POA: Diagnosis not present

## 2023-03-08 DIAGNOSIS — I509 Heart failure, unspecified: Secondary | ICD-10-CM | POA: Diagnosis not present

## 2023-03-08 DIAGNOSIS — I429 Cardiomyopathy, unspecified: Secondary | ICD-10-CM | POA: Diagnosis not present

## 2023-03-08 DIAGNOSIS — I493 Ventricular premature depolarization: Secondary | ICD-10-CM | POA: Diagnosis not present

## 2023-03-08 DIAGNOSIS — I779 Disorder of arteries and arterioles, unspecified: Secondary | ICD-10-CM | POA: Diagnosis not present

## 2023-03-08 DIAGNOSIS — I739 Peripheral vascular disease, unspecified: Secondary | ICD-10-CM | POA: Diagnosis not present

## 2023-03-08 DIAGNOSIS — I4891 Unspecified atrial fibrillation: Secondary | ICD-10-CM | POA: Diagnosis not present

## 2023-03-08 DIAGNOSIS — E782 Mixed hyperlipidemia: Secondary | ICD-10-CM | POA: Diagnosis not present

## 2023-03-08 DIAGNOSIS — G4733 Obstructive sleep apnea (adult) (pediatric): Secondary | ICD-10-CM | POA: Diagnosis not present

## 2023-03-08 DIAGNOSIS — I5022 Chronic systolic (congestive) heart failure: Secondary | ICD-10-CM | POA: Diagnosis not present

## 2023-03-08 DIAGNOSIS — R0602 Shortness of breath: Secondary | ICD-10-CM | POA: Diagnosis not present

## 2023-03-08 DIAGNOSIS — I10 Essential (primary) hypertension: Secondary | ICD-10-CM | POA: Diagnosis not present

## 2023-03-23 DIAGNOSIS — R0602 Shortness of breath: Secondary | ICD-10-CM | POA: Diagnosis not present

## 2023-04-16 DIAGNOSIS — G4733 Obstructive sleep apnea (adult) (pediatric): Secondary | ICD-10-CM | POA: Diagnosis not present

## 2023-04-26 DIAGNOSIS — K625 Hemorrhage of anus and rectum: Secondary | ICD-10-CM | POA: Diagnosis not present

## 2023-05-10 ENCOUNTER — Encounter: Payer: Self-pay | Admitting: Family

## 2023-05-10 ENCOUNTER — Ambulatory Visit: Payer: PPO | Attending: Family | Admitting: Family

## 2023-05-10 VITALS — BP 129/54 | HR 69 | Wt 195.0 lb

## 2023-05-10 DIAGNOSIS — Z7984 Long term (current) use of oral hypoglycemic drugs: Secondary | ICD-10-CM | POA: Insufficient documentation

## 2023-05-10 DIAGNOSIS — I509 Heart failure, unspecified: Secondary | ICD-10-CM | POA: Insufficient documentation

## 2023-05-10 DIAGNOSIS — G4733 Obstructive sleep apnea (adult) (pediatric): Secondary | ICD-10-CM

## 2023-05-10 DIAGNOSIS — I10 Essential (primary) hypertension: Secondary | ICD-10-CM

## 2023-05-10 DIAGNOSIS — R0602 Shortness of breath: Secondary | ICD-10-CM | POA: Diagnosis not present

## 2023-05-10 DIAGNOSIS — I5032 Chronic diastolic (congestive) heart failure: Secondary | ICD-10-CM | POA: Diagnosis not present

## 2023-05-10 DIAGNOSIS — E785 Hyperlipidemia, unspecified: Secondary | ICD-10-CM | POA: Diagnosis not present

## 2023-05-10 DIAGNOSIS — I420 Dilated cardiomyopathy: Secondary | ICD-10-CM | POA: Insufficient documentation

## 2023-05-10 DIAGNOSIS — G4730 Sleep apnea, unspecified: Secondary | ICD-10-CM | POA: Diagnosis not present

## 2023-05-10 DIAGNOSIS — I480 Paroxysmal atrial fibrillation: Secondary | ICD-10-CM | POA: Diagnosis not present

## 2023-05-10 DIAGNOSIS — Z79899 Other long term (current) drug therapy: Secondary | ICD-10-CM | POA: Insufficient documentation

## 2023-05-10 DIAGNOSIS — I110 Hypertensive heart disease with heart failure: Secondary | ICD-10-CM | POA: Insufficient documentation

## 2023-05-10 NOTE — Progress Notes (Signed)
PCP: Charissa Bash, MD (last seen 06/24) Primary Cardiologist: Dorothyann Peng, MD (last seen 09/24)  HPI:  Cody Pollard is a 82 y/o male with a history of atrial fibrillation, hyperlipidemia, HTN, BPH, kidney stones, trigeminal neuralgia, sleep apnea and chronic heart failure.   Has not been admitted or been in the ED in the last 6 months.  Echo 06/12/21: EF of 25-30% Echo 08/25/21:  EF of 30%. Echo 01/23/22: EF of 50-55% along with mildly elevated PA pressure, mild LAE and mild Cody.   Echo 03/23/23: EF 47-50% with mild LAE  RHC/LHC 06/13/21: Prox LAD lesion is 50% stenosed.   There is severe left ventricular systolic dysfunction.   LV end diastolic pressure is mildly elevated.   The left ventricular ejection fraction is less than 25% by visual estimate.   No significant obstructive coronary disease   Normal right heart pressures   Dilated nonischemic cardiomyopathy  He presents today for a HF follow up visit with a chief complaint of minimal fatigue with moderate exertion. Chronic in nature. Has associated shortness of breath, dizziness in the mornings or with sudden position changes along with this. Sleeping well with 2 pillows. Denies chest pain, cough, palpitations, abdominal distention, pedal edema or weight gain.    Received his flu vaccine for this reason. 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   ROS: All systems negative except as listed in HPI, PMH and Problem List.  SH:  Social History   Socioeconomic History   Marital status: Married    Spouse name: Not on file   Number of children: Not on file   Years of education: Not on file   Highest education level: Not on file  Occupational History   Not on file  Tobacco Use   Smoking status: Never   Smokeless tobacco: Never  Vaping Use   Vaping status: Never Used  Substance and Sexual Activity   Alcohol use: No   Drug use: No   Sexual activity: Not on file  Other Topics Concern   Not on file   Social History Narrative   Not on file   Social Determinants of Health   Financial Resource Strain: Not on file  Food Insecurity: Not on file  Transportation Needs: Not on file  Physical Activity: Not on file  Stress: Not on file  Social Connections: Not on file  Intimate Partner Violence: Not on file    FH: No family history on file.  Past Medical History:  Diagnosis Date   Arrhythmia    atrial fibrillation   BPH (benign prostatic hyperplasia)    CHF (congestive heart failure) (HCC)    Complication of anesthesia    Edema    MILD FEET/ANKLES OCCAS   History of kidney stones    Hyperlipidemia    Hypertension    Neuromuscular disorder (HCC)    TRIGEMINAL NEURALGIA   Palpitations    PONV (postoperative nausea and vomiting)    Sleep apnea    DOES NOT USE CPAP   Vertigo     Current Outpatient Medications  Medication Sig Dispense Refill   aspirin EC 81 MG tablet Take 162 mg by mouth daily.     carvedilol (COREG) 3.125 MG tablet Take 3.125 mg by mouth 2 (two) times daily with a meal.     dapagliflozin propanediol (FARXIGA) 10 MG TABS tablet Take 1 tablet (10 mg total) by mouth daily before breakfast. 90 tablet 3   dutasteride (AVODART) 0.5 MG capsule Take 0.5  mg by mouth daily.     furosemide (LASIX) 20 MG tablet Take 20 mg by mouth daily.     gabapentin (NEURONTIN) 300 MG capsule Take 300 mg by mouth 3 (three) times daily.     rosuvastatin (CRESTOR) 10 MG tablet Take 10 mg by mouth daily.     sacubitril-valsartan (ENTRESTO) 24-26 MG Take 1 tablet by mouth 2 (two) times daily. 180 tablet 3   No current facility-administered medications for this visit.   Vitals:   05/10/23 1356  BP: (!) 129/54  Pulse: 69  SpO2: 95%  Weight: 195 lb (88.5 kg)   Wt Readings from Last 3 Encounters:  05/10/23 195 lb (88.5 kg)  11/03/22 201 lb 9.6 oz (91.4 kg)  04/27/22 204 lb 8 oz (92.8 kg)   Lab Results  Component Value Date   CREATININE 1.11 11/12/2021   CREATININE 1.15  10/08/2021   CREATININE 0.86 06/14/2021   PHYSICAL EXAM:  General:  Well appearing. No resp difficulty HEENT: normal Neck: supple. JVP flat. Carotids 2+ bilaterally; no bruits. No lymphadenopathy or thryomegaly appreciated. Cor: PMI normal. Regular rate & rhythm. No rubs, gallops or murmurs. Lungs: clear Abdomen: soft, nontender, nondistended. No hepatosplenomegaly. No bruits or masses.  Extremities: no cyanosis, clubbing, rash, trace pitting edema right lower leg but he's been standing at work all day Neuro: alert & orientedx3, cranial nerves grossly intact. Moves all 4 extremities w/o difficulty. Affect pleasant.   ECG: not done   ASSESSMENT & PLAN:  1: NICM with preserved ejection fraction- - etiology likely HTN - NYHA class II - euvolemic today - weighing daily; reminded to call for an overnight weight gain of > 2 pounds or a weekly weight gain of > 5 pounds - weight down 6 pounds from last visit here 6 months ago - Echo 01/23/22: EF of 50-55% along with mildly elevated PA pressure, mild LAE and mild Cody.  - Echo 08/25/21:  EF of 30%.  - Echo 06/12/21: EF of 25-30% - Echo 03/23/23: EF 47-50% with mild LAE - RHC/LHC 06/13/21: Prox LAD lesion is 50% stenosed.   There is severe left ventricular systolic dysfunction.   LV end diastolic pressure is mildly elevated.   The left ventricular ejection fraction is less than 25% by visual estimate.   No significant obstructive coronary disease   Normal right heart pressures   Dilated nonischemic cardiomyopathy - not adding salt and has been looking at food labels - saw cardiology Juliann Pares) 09/24 - continue carvedilol 3.125mg  BID - continue farxiga 10mg  daily - continue furosemide 20mg  daily - continue entresto 24/26mg  BID - adhering to 64 ounces/day of fluid - BNP 11/12/21 was 459.9  2: HTN- - BP 129/54 - saw PCP Dareen Piano) 06/24 - BMP 11/18/22 reviewed and showed sodium 141, potassium 4.3, creatinine 1.1 and GFR 67  3: PAF-  -  saw EP Lalla Brothers) 01/14/22 - regular rhythm today  4: Sleep apnea- - wearing CPAP 5-6 hours/ night  - reports sleeping well  Offered to make appt PRN but patient prefers to come yearly. Return sooner if needed.

## 2023-05-13 ENCOUNTER — Telehealth: Payer: Self-pay

## 2023-05-13 NOTE — Telephone Encounter (Signed)
Patient Advocate Encounter  Application for Farxiga faxed to [employer] on 05/13/2023. Application form attached to patient chart.

## 2023-05-13 NOTE — Telephone Encounter (Signed)
Patient Advocate Encounter  Application for [facility] faxed to [employer] on 05/13/2023. Application form attached to patient chart.  Burnell Blanks, CPhT Rx Patient Advocate Phone: [phone]

## 2023-05-19 NOTE — Telephone Encounter (Signed)
[employer] is requesting LIS denial letter before making a determination, presumably [employer] will request the same information. Spoke with patient by phone, provided information about applying for Medicare Extra Help. Pt will apply and contact me once approved or denied.  Will update encounter if additional information is received from [employer] before then.

## 2023-05-19 NOTE — Telephone Encounter (Signed)
Contacted [employer] for status update. [employer] is requesting LIS denial letter before making a determination. Spoke with patient by phone, provided information about applying for Medicare Extra Help. Pt will apply and contact me once approved or denied.

## 2023-05-25 ENCOUNTER — Encounter: Payer: Self-pay | Admitting: Gastroenterology

## 2023-05-25 DIAGNOSIS — I5022 Chronic systolic (congestive) heart failure: Secondary | ICD-10-CM | POA: Diagnosis not present

## 2023-05-25 DIAGNOSIS — E038 Other specified hypothyroidism: Secondary | ICD-10-CM | POA: Diagnosis not present

## 2023-05-25 DIAGNOSIS — R7303 Prediabetes: Secondary | ICD-10-CM | POA: Diagnosis not present

## 2023-05-25 DIAGNOSIS — E7800 Pure hypercholesterolemia, unspecified: Secondary | ICD-10-CM | POA: Diagnosis not present

## 2023-06-01 DIAGNOSIS — E038 Other specified hypothyroidism: Secondary | ICD-10-CM | POA: Diagnosis not present

## 2023-06-01 DIAGNOSIS — H547 Unspecified visual loss: Secondary | ICD-10-CM | POA: Diagnosis not present

## 2023-06-01 DIAGNOSIS — I10 Essential (primary) hypertension: Secondary | ICD-10-CM | POA: Diagnosis not present

## 2023-06-01 DIAGNOSIS — I779 Disorder of arteries and arterioles, unspecified: Secondary | ICD-10-CM | POA: Diagnosis not present

## 2023-06-01 DIAGNOSIS — R7303 Prediabetes: Secondary | ICD-10-CM | POA: Diagnosis not present

## 2023-06-01 DIAGNOSIS — I5022 Chronic systolic (congestive) heart failure: Secondary | ICD-10-CM | POA: Diagnosis not present

## 2023-06-01 DIAGNOSIS — E7800 Pure hypercholesterolemia, unspecified: Secondary | ICD-10-CM | POA: Diagnosis not present

## 2023-06-01 DIAGNOSIS — Z0000 Encounter for general adult medical examination without abnormal findings: Secondary | ICD-10-CM | POA: Diagnosis not present

## 2023-06-02 ENCOUNTER — Other Ambulatory Visit: Payer: Self-pay

## 2023-06-02 ENCOUNTER — Telehealth: Payer: Self-pay | Admitting: Family

## 2023-06-02 MED ORDER — SACUBITRIL-VALSARTAN 24-26 MG PO TABS: 1.0000 | ORAL_TABLET | Freq: Two times a day (BID) | ORAL | 3 refills | Status: DC

## 2023-06-03 ENCOUNTER — Other Ambulatory Visit (HOSPITAL_COMMUNITY): Payer: Self-pay

## 2023-06-03 DIAGNOSIS — H2511 Age-related nuclear cataract, right eye: Secondary | ICD-10-CM | POA: Diagnosis not present

## 2023-06-03 DIAGNOSIS — D3131 Benign neoplasm of right choroid: Secondary | ICD-10-CM | POA: Diagnosis not present

## 2023-06-03 DIAGNOSIS — M3501 Sicca syndrome with keratoconjunctivitis: Secondary | ICD-10-CM | POA: Diagnosis not present

## 2023-06-03 DIAGNOSIS — H43813 Vitreous degeneration, bilateral: Secondary | ICD-10-CM | POA: Diagnosis not present

## 2023-06-03 NOTE — Telephone Encounter (Signed)
Attempted to contact patient for update. No answer, left voicemail

## 2023-06-07 ENCOUNTER — Telehealth: Payer: Self-pay

## 2023-06-07 ENCOUNTER — Other Ambulatory Visit (HOSPITAL_COMMUNITY): Payer: Self-pay

## 2023-06-07 ENCOUNTER — Other Ambulatory Visit: Payer: Self-pay

## 2023-06-07 MED ORDER — SACUBITRIL-VALSARTAN 24-26 MG PO TABS: 1.0000 | ORAL_TABLET | Freq: Two times a day (BID) | ORAL | 3 refills | Status: DC

## 2023-06-07 NOTE — Telephone Encounter (Signed)
Advanced Heart Failure Patient Advocate Encounter  The patient was approved for a [employer] grant that will help cover the cost of Carvedilol, Sherryll Burger, Farxiga.  Total amount awarded, $10,000.  Effective: 05/08/2023 - 05/06/2024.  BIN [id number] PCN [id number] [id number] ID [id number]  Pharmacy provided with approval and processing information. Patient informed via phone.  Burnell Blanks, CPhT Rx Patient Advocate Phone: [phone]

## 2023-06-07 NOTE — Telephone Encounter (Signed)
 Cody Pollard obtained in separate encounter

## 2023-06-20 ENCOUNTER — Other Ambulatory Visit: Payer: Self-pay | Admitting: Family

## 2023-06-21 DIAGNOSIS — H2511 Age-related nuclear cataract, right eye: Secondary | ICD-10-CM | POA: Diagnosis not present

## 2023-06-25 ENCOUNTER — Other Ambulatory Visit: Payer: Self-pay

## 2023-06-25 MED ORDER — SACUBITRIL-VALSARTAN 24-26 MG PO TABS: 1.0000 | ORAL_TABLET | Freq: Two times a day (BID) | ORAL | 3 refills | Status: AC

## 2023-06-25 NOTE — Progress Notes (Signed)
 Entresto Refill resent to [employer] for [employer] pt assistance.

## 2023-06-28 ENCOUNTER — Encounter
Admission: RE | Disposition: A | Discharge status: Home / Self Care | Payer: Self-pay | Source: Home / Self Care | Attending: Gastroenterology

## 2023-06-28 ENCOUNTER — Ambulatory Visit: Payer: PPO | Admitting: Anesthesiology

## 2023-06-28 ENCOUNTER — Other Ambulatory Visit: Payer: Self-pay

## 2023-06-28 ENCOUNTER — Ambulatory Visit
Admission: RE | Admit: 2023-06-28 | Discharge: 2023-06-28 | Disposition: A | Discharge status: Home / Self Care | Payer: PPO | Attending: Gastroenterology | Admitting: Gastroenterology

## 2023-06-28 ENCOUNTER — Encounter: Payer: Self-pay | Admitting: Gastroenterology

## 2023-06-28 DIAGNOSIS — I4891 Unspecified atrial fibrillation: Secondary | ICD-10-CM | POA: Diagnosis not present

## 2023-06-28 DIAGNOSIS — Z79899 Other long term (current) drug therapy: Secondary | ICD-10-CM | POA: Diagnosis not present

## 2023-06-28 DIAGNOSIS — G4730 Sleep apnea, unspecified: Secondary | ICD-10-CM | POA: Diagnosis not present

## 2023-06-28 DIAGNOSIS — K641 Second degree hemorrhoids: Secondary | ICD-10-CM | POA: Diagnosis not present

## 2023-06-28 DIAGNOSIS — I509 Heart failure, unspecified: Secondary | ICD-10-CM | POA: Insufficient documentation

## 2023-06-28 DIAGNOSIS — D120 Benign neoplasm of cecum: Secondary | ICD-10-CM | POA: Insufficient documentation

## 2023-06-28 DIAGNOSIS — D122 Benign neoplasm of ascending colon: Secondary | ICD-10-CM | POA: Insufficient documentation

## 2023-06-28 DIAGNOSIS — K625 Hemorrhage of anus and rectum: Secondary | ICD-10-CM | POA: Diagnosis not present

## 2023-06-28 DIAGNOSIS — D123 Benign neoplasm of transverse colon: Secondary | ICD-10-CM | POA: Diagnosis not present

## 2023-06-28 DIAGNOSIS — K5730 Diverticulosis of large intestine without perforation or abscess without bleeding: Secondary | ICD-10-CM | POA: Insufficient documentation

## 2023-06-28 DIAGNOSIS — I110 Hypertensive heart disease with heart failure: Secondary | ICD-10-CM | POA: Insufficient documentation

## 2023-06-28 DIAGNOSIS — K649 Unspecified hemorrhoids: Secondary | ICD-10-CM | POA: Diagnosis not present

## 2023-06-28 DIAGNOSIS — K635 Polyp of colon: Secondary | ICD-10-CM | POA: Diagnosis not present

## 2023-06-28 HISTORY — PX: COLONOSCOPY WITH PROPOFOL: SHX5780

## 2023-06-28 HISTORY — PX: POLYPECTOMY: SHX5525

## 2023-06-28 SURGERY — COLONOSCOPY WITH PROPOFOL
Anesthesia: General

## 2023-06-28 MED ORDER — PROPOFOL 500 MG/50ML IV EMUL
INTRAVENOUS | Status: DC | PRN
  Administered 2023-06-28: 150 ug/kg/min via INTRAVENOUS

## 2023-06-28 MED ORDER — EPHEDRINE 5 MG/ML INJ
INTRAVENOUS | Status: AC
Start: 2023-06-28 — End: ?
  Filled 2023-06-28: qty 5

## 2023-06-28 MED ORDER — SODIUM CHLORIDE 0.9 % IV SOLN: INTRAVENOUS | Status: DC

## 2023-06-28 MED ORDER — PROPOFOL 10 MG/ML IV BOLUS
INTRAVENOUS | Status: DC | PRN
  Administered 2023-06-28: 100 mg via INTRAVENOUS

## 2023-06-28 MED ORDER — PROPOFOL 1000 MG/100ML IV EMUL
INTRAVENOUS | Status: AC
  Filled 2023-06-28: qty 100

## 2023-06-28 MED ORDER — EPHEDRINE SULFATE-NACL 50-0.9 MG/10ML-% IV SOSY
PREFILLED_SYRINGE | INTRAVENOUS | Status: DC | PRN
  Administered 2023-06-28 (×2): 10 mg via INTRAVENOUS

## 2023-06-28 NOTE — Interval H&P Note (Signed)
 History and Physical Interval Note: Preprocedure H&P from 06/28/23  was reviewed and there was no interval change after seeing and examining the patient.  Written consent was obtained from the patient after discussion of risks, benefits, and alternatives. Patient has consented to proceed with Colonoscopy with possible intervention   06/28/2023 10:59 AM  Cody DELENA Mallick Sr.  has presented today for surgery, with the diagnosis of K62.5 (ICD-10-CM) - Rectal bleeding.  The various methods of treatment have been discussed with the patient and family. After consideration of risks, benefits and other options for treatment, the patient has consented to  Procedure(s): COLONOSCOPY WITH PROPOFOL  (N/A) as a surgical intervention.  The patient's history has been reviewed, patient examined, no change in status, stable for surgery.  I have reviewed the patient's chart and labs.  Questions were answered to the patient's satisfaction.     Elspeth Ozell Jungling

## 2023-06-28 NOTE — Anesthesia Postprocedure Evaluation (Signed)
 Anesthesia Post Note  Patient: Cody CAHALL Sr.  Procedure(s) Performed: COLONOSCOPY WITH PROPOFOL  POLYPECTOMY  Patient location during evaluation: Endoscopy Anesthesia Type: General Level of consciousness: awake and alert Pain management: pain level controlled Vital Signs Assessment: post-procedure vital signs reviewed and stable Respiratory status: spontaneous breathing, nonlabored ventilation, respiratory function stable and patient connected to nasal cannula oxygen Cardiovascular status: blood pressure returned to baseline and stable Postop Assessment: no apparent nausea or vomiting Anesthetic complications: no  There were no known notable events for this encounter.   Last Vitals:  Vitals:   06/28/23 1139 06/28/23 1149  BP: (!) 97/53 106/66  Pulse: 76 66  Resp: 20 17  Temp:    SpO2: 100% 99%    Last Pain:  Vitals:   06/28/23 1149  TempSrc:   PainSc: 0-No pain                 Debby Mines

## 2023-06-28 NOTE — Anesthesia Preprocedure Evaluation (Signed)
 Anesthesia Evaluation  Patient identified by MRN, date of birth, ID band Patient awake    Reviewed: Allergy & Precautions, NPO status , Patient's Chart, lab work & pertinent test results  History of Anesthesia Complications (+) PONV and history of anesthetic complications  Airway Mallampati: II  TM Distance: >3 FB Neck ROM: Full    Dental no notable dental hx. (+) Dental Advidsory Given   Pulmonary sleep apnea (has CPAP prescribed but does not use) and Continuous Positive Airway Pressure Ventilation , neg COPD   breath sounds clear to auscultation- rhonchi (-) wheezing      Cardiovascular Exercise Tolerance: Good hypertension, On Medications (-) CAD, (-) Past MI, (-) Cardiac Stents and (-) CABG  Rhythm:Regular Rate:Normal - Systolic murmurs and - Diastolic murmurs    Neuro/Psych negative neurological ROS  negative psych ROS   GI/Hepatic negative GI ROS, Neg liver ROS,,,  Endo/Other  negative endocrine ROSneg diabetes    Renal/GU      Musculoskeletal   Abdominal   Peds  Hematology negative hematology ROS (+)   Anesthesia Other Findings Past Medical History: No date: BPH (benign prostatic hyperplasia) No date: Complication of anesthesia No date: Edema     Comment:  MILD FEET/ANKLES OCCAS No date: History of kidney stones No date: Hyperlipidemia No date: Hypertension No date: Neuromuscular disorder (HCC)     Comment:  TRIGEMINAL NEURALGIA No date: Palpitations No date: PONV (postoperative nausea and vomiting) No date: Sleep apnea     Comment:  DOES NOT USE CPAP No date: Vertigo   Reproductive/Obstetrics                             Anesthesia Physical Anesthesia Plan  ASA: II  Anesthesia Plan: General   Post-op Pain Management:    Induction: Intravenous  PONV Risk Score and Plan: 2 and Propofol  infusion, TIVA and Ondansetron   Airway Management Planned: Natural Airway and  Nasal Cannula  Additional Equipment:   Intra-op Plan:   Post-operative Plan:   Informed Consent: I have reviewed the patients History and Physical, chart, labs and discussed the procedure including the risks, benefits and alternatives for the proposed anesthesia with the patient or authorized representative who has indicated his/her understanding and acceptance.     Dental Advisory Given  Plan Discussed with: Anesthesiologist, CRNA and Surgeon  Anesthesia Plan Comments: (Patient consented for risks of anesthesia including but not limited to:  - adverse reactions to medications - risk of airway placement if required - damage to eyes, teeth, lips or other oral mucosa - nerve damage due to positioning  - sore throat or hoarseness - Damage to heart, brain, nerves, lungs, other parts of body or loss of life  Patient voiced understanding and assent.)       Anesthesia Quick Evaluation

## 2023-06-28 NOTE — Transfer of Care (Signed)
 Immediate Anesthesia Transfer of Care Note  Patient: Cody DELENA Mallick Sr.  Procedure(s) Performed: COLONOSCOPY WITH PROPOFOL  POLYPECTOMY  Patient Location: PACU  Anesthesia Type:General  Level of Consciousness: awake and sedated  Airway & Oxygen Therapy: Patient Spontanous Breathing and Patient connected to face mask oxygen  Post-op Assessment: Report given to RN and Post -op Vital signs reviewed and stable  Post vital signs: Reviewed and stable  Last Vitals:  Vitals Value Taken Time  BP    Temp    Pulse    Resp    SpO2      Last Pain:  Vitals:   06/28/23 0950  TempSrc: Temporal  PainSc: 0-No pain         Complications: There were no known notable events for this encounter.

## 2023-06-28 NOTE — Op Note (Signed)
 [facility] Gastroenterology Patient Name: Cody Pollard Procedure Date: 06/28/2023 10:57 AM MRN: [id number] Account #: [id number] Date of Birth: 17-Jan-1941 Admit Type: Outpatient Age: 83 Room: [facility] ENDO ROOM 2 Gender: Male Note Status: Finalized Instrument Name: Colonoscope [id number] Procedure:             Colonoscopy Indications:           Rectal bleeding Providers:             Elspeth Ozell Onita ROSALEA, DO Referring MD:          Layman ORN. Lenon MD, MD (Referring MD) Medicines:             Monitored Anesthesia Care Complications:         No immediate complications. Estimated blood loss:                         Minimal. Procedure:             Pre-Anesthesia Assessment:                        - Prior to the procedure, a History and Physical was                         performed, and patient medications and allergies were                         reviewed. The patient is competent. The risks and                         benefits of the procedure and the sedation options and                         risks were discussed with the patient. All questions                         were answered and informed consent was obtained.                         Patient identification and proposed procedure were                         verified by the physician, the nurse, the anesthetist                         and the technician in the endoscopy suite. Mental                         Status Examination: alert and oriented. Airway                         Examination: normal oropharyngeal airway and neck                         mobility. Respiratory Examination: clear to                         auscultation. CV Examination: RRR, no murmurs, no S3  or S4. Prophylactic Antibiotics: The patient does not                         require prophylactic antibiotics. Prior                         Anticoagulants: The patient has taken no anticoagulant                          or antiplatelet agents. ASA Grade Assessment: II - A                         patient with mild systemic disease. After reviewing                         the risks and benefits, the patient was deemed in                         satisfactory condition to undergo the procedure. The                         anesthesia plan was to use monitored anesthesia care                         (MAC). Immediately prior to administration of                         medications, the patient was re-assessed for adequacy                         to receive sedatives. The heart rate, respiratory                         rate, oxygen saturations, blood pressure, adequacy of                         pulmonary ventilation, and response to care were                         monitored throughout the procedure. The physical                         status of the patient was re-assessed after the                         procedure.                        After obtaining informed consent, the colonoscope was                         passed under direct vision. Throughout the procedure,                         the patient's blood pressure, pulse, and oxygen                         saturations were monitored continuously. The  Colonoscope was introduced through the anus and                         advanced to the the terminal ileum, with                         identification of the appendiceal orifice and IC                         valve. The colonoscopy was performed without                         difficulty. The patient tolerated the procedure well.                         The quality of the bowel preparation was evaluated                         using the BBPS [facility] Bowel Preparation Scale) with                         scores of: Right Colon = 2 (minor amount of residual                         staining, small fragments of stool and/or opaque                         liquid, but mucosa seen well),  Transverse Colon = 2                         (minor amount of residual staining, small fragments of                         stool and/or opaque liquid, but mucosa seen well) and                         Left Colon = 3 (entire mucosa seen well with no                         residual staining, small fragments of stool or opaque                         liquid). The total BBPS score equals 7. The quality of                         the bowel preparation was good. The terminal ileum,                         ileocecal valve, appendiceal orifice, and rectum were                         photographed. Findings:      The perianal and digital rectal examinations were normal. Pertinent       negatives include normal sphincter tone.      The terminal ileum appeared normal. Estimated blood loss: none.      Retroflexion in the right colon was performed.  Multiple small-mouthed diverticula were found in the left colon.       Estimated blood loss: none.      Non-bleeding internal hemorrhoids were found during retroflexion. The       hemorrhoids were Grade II (internal hemorrhoids that prolapse but reduce       spontaneously). Estimated blood loss: none.      Three sessile polyps were found in the transverse colon, ascending colon       and cecum. The polyps were 3 to 5 mm in size. These polyps were removed       with a cold snare. Resection was complete, but the polyp tissue was only       partially retrieved. Estimated blood loss was minimal.      A 1 to 2 mm polyp was found in the ascending colon. The polyp was       sessile. The polyp was removed with a jumbo cold forceps. Resection and       retrieval were complete. Estimated blood loss was minimal.      The exam was otherwise without abnormality on direct and retroflexion       views. Impression:            - The examined portion of the ileum was normal.                        - Diverticulosis in the left colon.                        -  Non-bleeding internal hemorrhoids.                        - Three 3 to 5 mm polyps in the transverse colon, in                         the ascending colon and in the cecum, removed with a                         cold snare. Complete resection. Partial retrieval.                        - One 1 to 2 mm polyp in the ascending colon, removed                         with a jumbo cold forceps. Resected and retrieved.                        - The examination was otherwise normal on direct and                         retroflexion views. Recommendation:        - Patient has a contact number available for                         emergencies. The signs and symptoms of potential                         delayed complications were discussed with the patient.  Return to normal activities tomorrow. Written                         discharge instructions were provided to the patient.                        - Discharge patient to home.                        - Resume previous diet.                        - Continue present medications.                        - No ibuprofen, naproxen, or other non-steroidal                         anti-inflammatory drugs for 5 days after polyp removal.                        - Await pathology results.                        - No repeat screening/surveillance colonoscopy                         indicated due to advanced age.                        - Return to GI office PRN.                        - The findings and recommendations were discussed with                         the patient. Procedure Code(s):     --- Professional ---                        [phone], Colonoscopy, flexible; with removal of                         tumor(s), polyp(s), or other lesion(s) by snare                         technique                        [id number], 59, Colonoscopy, flexible; with biopsy, single                         or multiple Diagnosis Code(s):     --- Professional  ---                        K64.1, Second degree hemorrhoids                        D12.3, Benign neoplasm of transverse colon (hepatic                         flexure or splenic flexure)  D12.2, Benign neoplasm of ascending colon                        D12.0, Benign neoplasm of cecum                        K62.5, Hemorrhage of anus and rectum                        K57.30, Diverticulosis of large intestine without                         perforation or abscess without bleeding CPT copyright 2022 American Medical Association. All rights reserved. The codes documented in this report are preliminary and upon coder review may  be revised to meet current compliance requirements. Attending Participation:      I personally performed the entire procedure. Elspeth Jungling, DO Elspeth Ozell Jungling DO, DO 06/28/2023 11:33:53 AM This report has been signed electronically. Number of Addenda: 0 Note Initiated On: 06/28/2023 10:57 AM Scope Withdrawal Time: 0 hours 9 minutes 38 seconds  Total Procedure Duration: 0 hours 15 minutes 37 seconds  Estimated Blood Loss:  Estimated blood loss was minimal.      [facility]

## 2023-06-28 NOTE — H&P (Signed)
 Pre-Procedure H&P   Patient ID: Cody MCGLINN Sr. is a 83 y.o. male.  Gastroenterology Provider: Elspeth Ozell Jungling, DO  Referring Provider: Romero Antigua, PA PCP: Lenon Layman ORN, MD  Date: 06/28/2023  HPI Mr. Cody BATTERSHELL Sr. is a 83 y.o. male who presents today for Colonoscopy for Rectal bleeding .  Patient had 4 episodes of rectal bleeding with increased straining and without pain in May.  No further episodes since then.  Brother with a history of colon polyps.  Patient listed as having A-fib but no anticoagulation.  Last colonoscopy was in November 2017 with 2 adenomatous polyps left-sided diverticulosis and internal hemorrhoids.  Also had colonoscopies in 2012 2007 2001  Hemoglobin 13.8 MCV 89.7 platelets 187,000 creatinine 1.1   Past Medical History:  Diagnosis Date   Arrhythmia    atrial fibrillation   BPH (benign prostatic hyperplasia)    CHF (congestive heart failure) (HCC)    Complication of anesthesia    Edema    MILD FEET/ANKLES OCCAS   History of kidney stones    Hyperlipidemia    Hypertension    Neuromuscular disorder (HCC)    TRIGEMINAL NEURALGIA   Palpitations    PONV (postoperative nausea and vomiting)    Sleep apnea    DOES NOT USE CPAP   Vertigo     Past Surgical History:  Procedure Laterality Date   CARDIAC CATHETERIZATION     CATARACT EXTRACTION W/PHACO Left 09/23/2017   Procedure: CATARACT EXTRACTION PHACO AND INTRAOCULAR LENS PLACEMENT (IOC);  Surgeon: Myrna Adine Anes, MD;  Location: [facility] ORS;  Service: Ophthalmology;  Laterality: Left;  US  00:19 AP% 8.5 CDE 1.64 Fluid pak lot # [id number]   COLONOSCOPY WITH PROPOFOL  N/A 05/06/2016   Procedure: COLONOSCOPY WITH PROPOFOL ;  Surgeon: Lamar ONEIDA Holmes, MD;  Location: [facility] ENDOSCOPY;  Service: Endoscopy;  Laterality: N/A;   EYE SURGERY     HERNIA REPAIR     RIGHT/LEFT HEART CATH AND CORONARY ANGIOGRAPHY N/A 06/13/2021   Procedure: RIGHT/LEFT HEART CATH AND CORONARY  ANGIOGRAPHY and possible PCI and stent;  Surgeon: Florencio Cara BIRCH, MD;  Location: [facility] INVASIVE CV LAB;  Service: Cardiovascular;  Laterality: N/A;    Family History Brother- colon polyps No h/o GI disease or malignancy  Review of Systems  Constitutional:  Negative for activity change, appetite change, chills, diaphoresis, fatigue, fever and unexpected weight change.  HENT:  Negative for trouble swallowing and voice change.   Respiratory:  Negative for shortness of breath and wheezing.   Cardiovascular:  Negative for chest pain, palpitations and leg swelling.  Gastrointestinal:  Positive for anal bleeding. Negative for abdominal distention, abdominal pain, blood in stool, constipation, diarrhea, nausea and vomiting.  Musculoskeletal:  Negative for arthralgias and myalgias.  Skin:  Negative for color change and pallor.  Neurological:  Negative for dizziness, syncope and weakness.  Psychiatric/Behavioral:  Negative for confusion. The patient is not nervous/anxious.   All other systems reviewed and are negative.    Medications No current facility-administered medications on file prior to encounter.   Current Outpatient Medications on File Prior to Encounter  Medication Sig Dispense Refill   amLODipine (NORVASC) 5 MG tablet Take 5 mg by mouth daily.     aspirin  EC 81 MG tablet Take 162 mg by mouth daily.     carvedilol  (COREG ) 3.125 MG tablet Take 3.125 mg by mouth 2 (two) times daily with a meal.     dutasteride (AVODART) 0.5 MG capsule Take 0.5 mg by mouth  daily.     furosemide  (LASIX ) 20 MG tablet Take 20 mg by mouth daily.     gabapentin (NEURONTIN) 300 MG capsule Take 300 mg by mouth 3 (three) times daily.     rosuvastatin  (CRESTOR ) 10 MG tablet Take 10 mg by mouth daily.     propranolol (INDERAL) 40 MG tablet Take 40 mg by mouth 3 (three) times daily. (Patient not taking: Reported on 06/28/2023)      Pertinent medications related to GI and procedure were reviewed by me with the  patient prior to the procedure   Current Facility-Administered Medications:    0.9 %  sodium chloride  infusion, , Intravenous, Continuous, Onita Elspeth Sharper, DO, Last Rate: 20 mL/hr at 06/28/23 1013, Continued from Pre-op at 06/28/23 1013  sodium chloride  20 mL/hr at 06/28/23 1013       Allergies  Allergen Reactions   Penicillins    Allergies were reviewed by me prior to the procedure  Objective   Body mass index is 26.11 kg/m. Vitals:   06/28/23 0950  BP: 130/64  Pulse: 71  Resp: 17  Temp: (!) 96 F (35.6 C)  TempSrc: Temporal  SpO2: 100%  Weight: 84.9 kg  Height: 5' 11 (1.803 m)     Physical Exam Vitals and nursing note reviewed.  Constitutional:      General: He is not in acute distress.    Appearance: Normal appearance. He is not ill-appearing, toxic-appearing or diaphoretic.  HENT:     Head: Normocephalic and atraumatic.     Nose: Nose normal.     Mouth/Throat:     Mouth: Mucous membranes are moist.     Pharynx: Oropharynx is clear.  Eyes:     General: No scleral icterus.    Extraocular Movements: Extraocular movements intact.  Cardiovascular:     Rate and Rhythm: Normal rate and regular rhythm.     Heart sounds: Normal heart sounds. No murmur heard.    No friction rub. No gallop.  Pulmonary:     Effort: Pulmonary effort is normal. No respiratory distress.     Breath sounds: Normal breath sounds. No wheezing, rhonchi or rales.  Abdominal:     General: Bowel sounds are normal. There is no distension.     Palpations: Abdomen is soft.     Tenderness: There is no abdominal tenderness. There is no guarding or rebound.  Musculoskeletal:     Cervical back: Neck supple.     Right lower leg: No edema.     Left lower leg: No edema.  Skin:    General: Skin is warm and dry.     Coloration: Skin is not jaundiced or pale.  Neurological:     General: No focal deficit present.     Mental Status: He is alert and oriented to person, place, and time. Mental  status is at baseline.  Psychiatric:        Mood and Affect: Mood normal.        Behavior: Behavior normal.        Thought Content: Thought content normal.        Judgment: Judgment normal.      Assessment:  Mr. Cody BARTHELEMY Sr. is a 83 y.o. male  who presents today for Colonoscopy for Rectal bleeding .  Plan:  Colonoscopy with possible intervention today  Colonoscopy with possible biopsy, control of bleeding, polypectomy, and interventions as necessary has been discussed with the patient/patient representative. Informed consent was obtained from the patient/patient representative after  explaining the indication, nature, and risks of the procedure including but not limited to death, bleeding, perforation, missed neoplasm/lesions, cardiorespiratory compromise, and reaction to medications. Opportunity for questions was given and appropriate answers were provided. Patient/patient representative has verbalized understanding is amenable to undergoing the procedure.   Elspeth Ozell Jungling, DO  [facility]astro[facility]  Portions of the record may have been created with voice recognition software. Occasional wrong-word or 'sound-a-like' substitutions may have occurred due to the inherent limitations of voice recognition software.  Read the chart carefully and recognize, using context, where substitutions may have occurred.

## 2023-06-29 ENCOUNTER — Encounter: Payer: Self-pay | Admitting: Gastroenterology

## 2023-06-29 LAB — SURGICAL PATHOLOGY

## 2023-06-30 ENCOUNTER — Encounter: Payer: Self-pay | Admitting: Ophthalmology

## 2023-06-30 NOTE — Anesthesia Preprocedure Evaluation (Addendum)
 Anesthesia Evaluation  Patient identified by MRN, date of birth, ID band Patient awake    Reviewed: Allergy & Precautions, NPO status , Patient's Chart, lab work & pertinent test results  History of Anesthesia Complications (+) PONV and history of anesthetic complications  Airway Mallampati: II  TM Distance: >3 FB Neck ROM: Full    Dental no notable dental hx. (+) Dental Advidsory Given   Pulmonary sleep apnea (has CPAP prescribed but does not use) and Continuous Positive Airway Pressure Ventilation , neg COPD   breath sounds clear to auscultation- rhonchi (-) wheezing      Cardiovascular Exercise Tolerance: Good hypertension, On Medications +CHF  (-) CAD, (-) Past MI, (-) Cardiac Stents and (-) CABG  Rhythm:Regular Rate:Normal - Systolic murmurs and - Diastolic murmurs Echo 03/23/23: EF 47-50% with mild LA (hx EF as low as 25%)    Neuro/Psych negative neurological ROS  negative psych ROS   GI/Hepatic negative GI ROS, Neg liver ROS,,,  Endo/Other  negative endocrine ROSneg diabetes    Renal/GU      Musculoskeletal   Abdominal   Peds  Hematology negative hematology ROS (+)   Anesthesia Other Findings BPH (benign prostatic hyperplasia) Hyperlipidemia Hypertension  Sleep apnea, does not use CPAP Neuromuscular disorder   History of kidney stones Vertigo  Complication of anesthesia PONV (postoperative nausea and vomiting) Palpitations Edema  CHF (congestive heart failure)  Arrhythmia  Wears dentures    Reproductive/Obstetrics                             Anesthesia Physical Anesthesia Plan  ASA: 3  Anesthesia Plan: MAC   Post-op Pain Management:    Induction: Intravenous  PONV Risk Score and Plan: 2 and Midazolam  and TIVA  Airway Management Planned: Natural Airway and Nasal Cannula  Additional Equipment:   Intra-op Plan:   Post-operative Plan:   Informed Consent: I have  reviewed the patients History and Physical, chart, labs and discussed the procedure including the risks, benefits and alternatives for the proposed anesthesia with the patient or authorized representative who has indicated his/her understanding and acceptance.     Dental Advisory Given  Plan Discussed with: Anesthesiologist, CRNA and Surgeon  Anesthesia Plan Comments: (Patient consented for risks of anesthesia including but not limited to:  - adverse reactions to medications - damage to eyes, teeth, lips or other oral mucosa - nerve damage due to positioning  - sore throat or hoarseness - Damage to heart, brain, nerves, lungs, other parts of body or loss of life  Patient voiced understanding and assent.)        Anesthesia Quick Evaluation

## 2023-07-01 NOTE — Discharge Instructions (Signed)

## 2023-07-06 ENCOUNTER — Ambulatory Visit: Payer: PPO | Admitting: Anesthesiology

## 2023-07-06 ENCOUNTER — Ambulatory Visit
Admission: RE | Admit: 2023-07-06 | Discharge: 2023-07-06 | Disposition: A | Discharge status: Home / Self Care | Payer: PPO | Attending: Ophthalmology | Admitting: Ophthalmology

## 2023-07-06 ENCOUNTER — Encounter
Admission: RE | Disposition: A | Discharge status: Home / Self Care | Payer: Self-pay | Source: Home / Self Care | Attending: Ophthalmology

## 2023-07-06 ENCOUNTER — Other Ambulatory Visit: Payer: Self-pay

## 2023-07-06 DIAGNOSIS — G4730 Sleep apnea, unspecified: Secondary | ICD-10-CM | POA: Insufficient documentation

## 2023-07-06 DIAGNOSIS — H2511 Age-related nuclear cataract, right eye: Secondary | ICD-10-CM | POA: Insufficient documentation

## 2023-07-06 DIAGNOSIS — I5042 Chronic combined systolic (congestive) and diastolic (congestive) heart failure: Secondary | ICD-10-CM | POA: Diagnosis not present

## 2023-07-06 DIAGNOSIS — H269 Unspecified cataract: Secondary | ICD-10-CM | POA: Diagnosis not present

## 2023-07-06 DIAGNOSIS — I110 Hypertensive heart disease with heart failure: Secondary | ICD-10-CM | POA: Insufficient documentation

## 2023-07-06 DIAGNOSIS — I4891 Unspecified atrial fibrillation: Secondary | ICD-10-CM | POA: Diagnosis not present

## 2023-07-06 HISTORY — DX: Chronic diastolic (congestive) heart failure: I50.32

## 2023-07-06 HISTORY — PX: CATARACT EXTRACTION W/PHACO: SHX586

## 2023-07-06 HISTORY — DX: Presence of dental prosthetic device (complete) (partial): Z97.2

## 2023-07-06 HISTORY — DX: Chronic systolic (congestive) heart failure: I50.22

## 2023-07-06 SURGERY — PHACOEMULSIFICATION, CATARACT, WITH IOL INSERTION
Anesthesia: Monitor Anesthesia Care | Site: Eye | Laterality: Right

## 2023-07-06 MED ORDER — FENTANYL CITRATE (PF) 100 MCG/2ML IJ SOLN
INTRAMUSCULAR | Status: DC | PRN
  Administered 2023-07-06: 50 ug via INTRAVENOUS

## 2023-07-06 MED ORDER — MOXIFLOXACIN HCL 0.5 % OP SOLN
OPHTHALMIC | Status: DC | PRN
  Administered 2023-07-06: .2 mL via OPHTHALMIC

## 2023-07-06 MED ORDER — FENTANYL CITRATE (PF) 100 MCG/2ML IJ SOLN
INTRAMUSCULAR | Status: AC
  Filled 2023-07-06: qty 2

## 2023-07-06 MED ORDER — SIGHTPATH DOSE#1 NA CHONDROIT SULF-NA HYALURON 40-17 MG/ML IO SOLN
INTRAOCULAR | Status: DC | PRN
  Administered 2023-07-06: 1 mL via INTRAOCULAR

## 2023-07-06 MED ORDER — MIDAZOLAM HCL 2 MG/2ML IJ SOLN
INTRAMUSCULAR | Status: DC | PRN
  Administered 2023-07-06: 1 mg via INTRAVENOUS

## 2023-07-06 MED ORDER — ARMC OPHTHALMIC DILATING DROPS
1.0000 | OPHTHALMIC | Status: DC | PRN
Start: 2023-07-06 — End: 2023-07-06
  Administered 2023-07-06 (×3): 1 via OPHTHALMIC

## 2023-07-06 MED ORDER — SIGHTPATH DOSE#1 BSS IO SOLN: INTRAOCULAR | Status: DC | PRN

## 2023-07-06 MED ORDER — MIDAZOLAM HCL 2 MG/2ML IJ SOLN
INTRAMUSCULAR | Status: AC
  Filled 2023-07-06: qty 2

## 2023-07-06 MED ORDER — BRIMONIDINE TARTRATE-TIMOLOL 0.2-0.5 % OP SOLN
OPHTHALMIC | Status: DC | PRN
  Administered 2023-07-06: 1 [drp] via OPHTHALMIC

## 2023-07-06 MED ORDER — ARMC OPHTHALMIC DILATING DROPS
OPHTHALMIC | Status: AC
  Filled 2023-07-06: qty 0.5

## 2023-07-06 MED ORDER — GLYCOPYRROLATE 0.2 MG/ML IJ SOLN
INTRAMUSCULAR | Status: DC | PRN
  Administered 2023-07-06 (×2): .1 mg via INTRAVENOUS

## 2023-07-06 MED ORDER — TETRACAINE HCL 0.5 % OP SOLN
OPHTHALMIC | Status: AC
  Filled 2023-07-06: qty 4

## 2023-07-06 MED ORDER — TETRACAINE HCL 0.5 % OP SOLN
1.0000 [drp] | OPHTHALMIC | Status: DC | PRN
Start: 2023-07-06 — End: 2023-07-06
  Administered 2023-07-06 (×3): 1 [drp] via OPHTHALMIC

## 2023-07-06 MED ORDER — SIGHTPATH DOSE#1 BSS IO SOLN
INTRAOCULAR | Status: DC | PRN
  Administered 2023-07-06: 15 mL via INTRAOCULAR

## 2023-07-06 MED ORDER — SIGHTPATH DOSE#1 BSS IO SOLN
INTRAOCULAR | Status: DC | PRN
  Administered 2023-07-06: 2 mL

## 2023-07-06 SURGICAL SUPPLY — 16 items
ANGLE REVERSE CUT SHRT 25GA (CUTTER) ×1
CANNULA ANT/CHMB 27G (MISCELLANEOUS) IMPLANT
CANNULA ANT/CHMB 27GA (MISCELLANEOUS)
CATARACT SUITE SIGHTPATH (MISCELLANEOUS) ×1
CYSTOTOME ANGL RVRS SHRT 25G (CUTTER) ×1 IMPLANT
FEE CATARACT SUITE SIGHTPATH (MISCELLANEOUS) ×1 IMPLANT
GLOVE BIOGEL PI IND STRL 8 (GLOVE) ×1 IMPLANT
GLOVE SURG LX STRL 8.0 MICRO (GLOVE) ×1 IMPLANT
LENS IOL TECNIS EYHANCE 17.5 (Intraocular Lens) IMPLANT
NDL FILTER BLUNT 18X1 1/2 (NEEDLE) ×1 IMPLANT
NEEDLE FILTER BLUNT 18X1 1/2 (NEEDLE) ×1
PACK VIT ANT 23G (MISCELLANEOUS) IMPLANT
RING MALYGIN (MISCELLANEOUS) IMPLANT
SUT ETHILON 10-0 CS-B-6CS-B-6 (SUTURE)
SUTURE EHLN 10-0 CS-B-6CS-B-6 (SUTURE) IMPLANT
SYR 3ML LL SCALE MARK (SYRINGE) ×1 IMPLANT

## 2023-07-06 NOTE — Transfer of Care (Signed)
 Immediate Anesthesia Transfer of Care Note  Patient: Cody Pollard.  Procedure(s) Performed: CATARACT EXTRACTION PHACO AND INTRAOCULAR LENS PLACEMENT (IOC) RIGHT MALYUGIN 7.69 00:49.6 (Right: Eye)  Patient Location: PACU  Anesthesia Type: MAC  Level of Consciousness: awake, alert  and patient cooperative  Airway and Oxygen Therapy: Patient Spontanous Breathing and Patient connected to supplemental oxygen  Post-op Assessment: Post-op Vital signs reviewed, Patient's Cardiovascular Status Stable, Respiratory Function Stable, Patent Airway and No signs of Nausea or vomiting  Post-op Vital Signs: Reviewed and stable  Complications: No notable events documented.

## 2023-07-06 NOTE — Anesthesia Postprocedure Evaluation (Signed)
 Anesthesia Post Note  Patient: AABAN GRIEP Sr.  Procedure(s) Performed: CATARACT EXTRACTION PHACO AND INTRAOCULAR LENS PLACEMENT (IOC) RIGHT MALYUGIN 7.69 00:49.6 (Right: Eye)  Patient location during evaluation: PACU Anesthesia Type: MAC Level of consciousness: awake and alert Pain management: pain level controlled Vital Signs Assessment: post-procedure vital signs reviewed and stable Respiratory status: spontaneous breathing, nonlabored ventilation, respiratory function stable and patient connected to nasal cannula oxygen Cardiovascular status: blood pressure returned to baseline and stable Postop Assessment: no apparent nausea or vomiting Anesthetic complications: no  No notable events documented.   Last Vitals:  Vitals:   07/06/23 0744  BP: (!) 122/59  Pulse: (!) 56  Temp: 36.5 C  SpO2: 98%    Last Pain:  Vitals:   07/06/23 0744  TempSrc: Temporal  PainSc: 0-No pain                 Debby Mines

## 2023-07-06 NOTE — H&P (Signed)
 [facility]   Primary Care Physician:  Lenon Layman ORN, MD Ophthalmologist: Dr. Jaye  Pre-Procedure History & Physical: HPI:  Cody LOSASSO Sr. is a 83 y.o. male here for cataract surgery.   Past Medical History:  Diagnosis Date   Arrhythmia    atrial fibrillation   BPH (benign prostatic hyperplasia)    CHF (congestive heart failure) (HCC)    Chronic diastolic CHF (congestive heart failure) (HCC)    Chronic systolic heart failure (HCC)    Complication of anesthesia    vertigo type symptoms   Edema    MILD FEET/ANKLES OCCAS   History of kidney stones    Hyperlipidemia    Hypertension    Neuromuscular disorder (HCC)    TRIGEMINAL NEURALGIA   Palpitations    PONV (postoperative nausea and vomiting)    Sleep apnea    DOES NOT USE CPAP   Vertigo    Wears dentures    partial upper    Past Surgical History:  Procedure Laterality Date   CARDIAC CATHETERIZATION     CATARACT EXTRACTION W/PHACO Left 09/23/2017   Procedure: CATARACT EXTRACTION PHACO AND INTRAOCULAR LENS PLACEMENT (IOC);  Surgeon: Myrna Adine Anes, MD;  Location: [facility] ORS;  Service: Ophthalmology;  Laterality: Left;  US  00:19 AP% 8.5 CDE 1.64 Fluid pak lot # [id number]   COLONOSCOPY WITH PROPOFOL  N/A 05/06/2016   Procedure: COLONOSCOPY WITH PROPOFOL ;  Surgeon: Lamar ONEIDA Holmes, MD;  Location: [facility] ENDOSCOPY;  Service: Endoscopy;  Laterality: N/A;   COLONOSCOPY WITH PROPOFOL  N/A 06/28/2023   Procedure: COLONOSCOPY WITH PROPOFOL ;  Surgeon: Onita Elspeth Sharper, DO;  Location: [facility] ENDOSCOPY;  Service: Gastroenterology;  Laterality: N/A;   EYE SURGERY     HERNIA REPAIR     POLYPECTOMY  06/28/2023   Procedure: POLYPECTOMY;  Surgeon: Onita Elspeth Sharper, DO;  Location: [facility] ENDOSCOPY;  Service: Gastroenterology;;   RIGHT/LEFT HEART CATH AND CORONARY ANGIOGRAPHY N/A 06/13/2021   Procedure: RIGHT/LEFT HEART CATH AND CORONARY ANGIOGRAPHY and possible PCI and stent;  Surgeon: Florencio Cara BIRCH, MD;   Location: [facility] INVASIVE CV LAB;  Service: Cardiovascular;  Laterality: N/A;    Prior to Admission medications   Medication Sig Start Date End Date Taking? Authorizing Provider  aspirin  EC 81 MG tablet Take 162 mg by mouth daily.   Yes [provider]  carvedilol  (COREG ) 3.125 MG tablet Take 6.25 mg by mouth 2 (two) times daily with a meal.   Yes [provider]  dimenhyDRINATE (DRAMAMINE) 50 MG tablet Take 50 mg by mouth every 8 (eight) hours as needed.   Yes [provider]  dutasteride (AVODART) 0.5 MG capsule Take 0.5 mg by mouth daily. 12/24/13  Yes [provider]  FARXIGA  10 MG TABS tablet TAKE 1 TABLET BY MOUTH DAILY BEFORE BREAKFAST. 06/21/23  Yes Hackney, Tina A, FNP  furosemide  (LASIX ) 20 MG tablet Take 20 mg by mouth daily.   Yes [provider]  gabapentin (NEURONTIN) 300 MG capsule Take 300 mg by mouth 3 (three) times daily. 04/04/21  Yes [provider]  rosuvastatin  (CRESTOR ) 10 MG tablet Take 10 mg by mouth daily.   Yes [provider]  sacubitril -valsartan  (ENTRESTO ) 24-26 MG Take 1 tablet by mouth 2 (two) times daily. 06/25/23  Yes Donette Ellouise DELENA, FNP    Allergies as of 06/09/2023 - Review Complete 05/25/2023  Allergen Reaction Noted   Penicillins  05/05/2016    History reviewed. No pertinent family history.  Social History   Socioeconomic History  Marital status: Married    Spouse name: Not on file   Number of children: Not on file   Years of education: Not on file   Highest education level: Not on file  Occupational History   Not on file  Tobacco Use   Smoking status: Never   Smokeless tobacco: Never  Vaping Use   Vaping status: Never Used  Substance and Sexual Activity   Alcohol use: No   Drug use: No   Sexual activity: Not on file  Other Topics Concern   Not on file  Social History Narrative   Not on file   Social Drivers of Health   Financial Resource Strain: Low Risk  (05/29/2023)    Received from [facility]   Overall Financial Resource Strain (CARDIA)    Difficulty of Paying Living Expenses: Not very hard  Food Insecurity: No Food Insecurity (05/29/2023)   Received from [facility]   Hunger Vital Sign    Worried About Running Out of Food in the Last Year: Never true    Ran Out of Food in the Last Year: Never true  Transportation Needs: No Transportation Needs (05/29/2023)   Received from [facility] - Transportation    In the past 12 months, has lack of transportation kept you from medical appointments or from getting medications?: No    Lack of Transportation (Non-Medical): No  Physical Activity: Not on file  Stress: Not on file  Social Connections: Not on file  Intimate Partner Violence: Not on file    Review of Systems: See HPI, otherwise negative ROS  Physical Exam: BP (!) 122/59   Pulse (!) 56   Temp 97.7 F (36.5 C) (Temporal)   Ht 5' 11 (1.803 m)   Wt 85.7 kg   SpO2 98%   BMI 26.36 kg/m  General:   Alert, cooperative in NAD Head:  Normocephalic and atraumatic. Respiratory:  Normal work of breathing. Cardiovascular:  RRR  Impression/Plan: Cody DELENA Cody Sr. is here for cataract surgery.  Risks, benefits, limitations, and alternatives regarding cataract surgery have been reviewed with the patient.  Questions have been answered.  All parties agreeable.   Elsie Carmine, MD  07/06/2023, 8:12 AM

## 2023-07-06 NOTE — Op Note (Signed)
 PREOPERATIVE DIAGNOSIS:  Nuclear sclerotic cataract of the right eye.   POSTOPERATIVE DIAGNOSIS:  Cataract   OPERATIVE PROCEDURE:ORPROCALL@   SURGEON:  Elsie Carmine, MD.   ANESTHESIA:  Anesthesiologist: Leavy Ned, MD CRNA: Jahoo, Sonia, CRNA  1.      Managed anesthesia care. 2.      0.56ml of Shugarcaine was instilled in the eye following the paracentesis.   COMPLICATIONS: Viscoelastic was used to raise the pupil margin.  A  Malyugin ring was placed as the pupil would not achieve sufficient pharmacologic dilation to undergo cataract extraction safely.( The ring was removed atraumatically following insertion of the IOL.)    TECHNIQUE:   Stop and chop   DESCRIPTION OF PROCEDURE:  The patient was examined and consented in the preoperative holding area where the aforementioned topical anesthesia was applied to the right eye and then brought back to the Operating Room where the right eye was prepped and draped in the usual sterile ophthalmic fashion and a lid speculum was placed. A paracentesis was created with the side port blade and the anterior chamber was filled with viscoelastic. A near clear corneal incision was performed with the steel keratome. A continuous curvilinear capsulorrhexis was performed with a cystotome followed by the capsulorrhexis forceps. Hydrodissection and hydrodelineation were carried out with BSS on a blunt cannula. The lens was removed in a stop and chop  technique and the remaining cortical material was removed with the irrigation-aspiration handpiece. The capsular bag was inflated with viscoelastic and the Technis [id number]  lens was placed in the capsular bag without complication. The remaining viscoelastic was removed from the eye with the irrigation-aspiration handpiece. The wounds were hydrated. The anterior chamber was flushed with BSS and the eye was inflated to physiologic pressure. 0.59ml of Vigamox  was placed in the anterior chamber. The wounds were found to  be water tight. The eye was dressed with Combigan . The patient was given protective glasses to wear throughout the day and a shield with which to sleep tonight. The patient was also given drops with which to begin a drop regimen today and will follow-up with me in one day. Implant Name Type Inv. Item Serial No. Manufacturer Lot No. LRB No. Used Action  LENS IOL TECNIS EYHANCE 17.5 - [id number] Intraocular Lens LENS IOL TECNIS EYHANCE 17.5 [id number] [employer]  Right 1 Implanted   Procedure(s): CATARACT EXTRACTION PHACO AND INTRAOCULAR LENS PLACEMENT (IOC) RIGHT MALYUGIN 7.69 00:49.6 (Right)  Electronically signed: Elsie Carmine 07/06/2023 8:36 AM

## 2023-07-07 ENCOUNTER — Encounter: Payer: Self-pay | Admitting: Ophthalmology

## 2023-07-19 ENCOUNTER — Telehealth: Payer: Self-pay

## 2023-07-19 NOTE — Telephone Encounter (Signed)
Received fax from [facility] that Marcelline Deist medication has been shipped to patient's home as of 07/15/2023.

## 2023-09-06 ENCOUNTER — Other Ambulatory Visit
Admission: RE | Admit: 2023-09-06 | Discharge: 2023-09-06 | Disposition: A | Discharge status: Home / Self Care | Source: Ambulatory Visit | Attending: Internal Medicine | Admitting: Internal Medicine

## 2023-09-06 DIAGNOSIS — G4733 Obstructive sleep apnea (adult) (pediatric): Secondary | ICD-10-CM | POA: Diagnosis not present

## 2023-09-06 DIAGNOSIS — R079 Chest pain, unspecified: Secondary | ICD-10-CM | POA: Insufficient documentation

## 2023-09-06 DIAGNOSIS — I493 Ventricular premature depolarization: Secondary | ICD-10-CM | POA: Diagnosis not present

## 2023-09-06 DIAGNOSIS — R0789 Other chest pain: Secondary | ICD-10-CM | POA: Diagnosis not present

## 2023-09-06 DIAGNOSIS — I4892 Unspecified atrial flutter: Secondary | ICD-10-CM | POA: Diagnosis not present

## 2023-09-06 DIAGNOSIS — E782 Mixed hyperlipidemia: Secondary | ICD-10-CM | POA: Diagnosis not present

## 2023-09-06 DIAGNOSIS — I5022 Chronic systolic (congestive) heart failure: Secondary | ICD-10-CM | POA: Diagnosis not present

## 2023-09-06 DIAGNOSIS — I10 Essential (primary) hypertension: Secondary | ICD-10-CM | POA: Diagnosis not present

## 2023-09-06 DIAGNOSIS — R0602 Shortness of breath: Secondary | ICD-10-CM | POA: Diagnosis not present

## 2023-09-06 DIAGNOSIS — I4891 Unspecified atrial fibrillation: Secondary | ICD-10-CM | POA: Diagnosis not present

## 2023-09-06 DIAGNOSIS — I429 Cardiomyopathy, unspecified: Secondary | ICD-10-CM | POA: Diagnosis not present

## 2023-09-06 LAB — BRAIN NATRIURETIC PEPTIDE: B Natriuretic Peptide: 190.5 pg/mL — ABNORMAL HIGH (ref 0.0–100.0)

## 2023-09-08 DIAGNOSIS — G4733 Obstructive sleep apnea (adult) (pediatric): Secondary | ICD-10-CM | POA: Diagnosis not present

## 2023-09-14 DIAGNOSIS — I493 Ventricular premature depolarization: Secondary | ICD-10-CM | POA: Diagnosis not present

## 2023-09-14 DIAGNOSIS — I5032 Chronic diastolic (congestive) heart failure: Secondary | ICD-10-CM | POA: Diagnosis not present

## 2023-09-28 DIAGNOSIS — I5032 Chronic diastolic (congestive) heart failure: Secondary | ICD-10-CM | POA: Diagnosis not present

## 2023-10-07 ENCOUNTER — Other Ambulatory Visit
Admission: RE | Admit: 2023-10-07 | Discharge: 2023-10-07 | Disposition: A | Discharge status: Home / Self Care | Attending: Internal Medicine | Admitting: Internal Medicine

## 2023-10-07 DIAGNOSIS — I4891 Unspecified atrial fibrillation: Secondary | ICD-10-CM | POA: Diagnosis not present

## 2023-10-07 DIAGNOSIS — I428 Other cardiomyopathies: Secondary | ICD-10-CM | POA: Diagnosis not present

## 2023-10-07 DIAGNOSIS — I4892 Unspecified atrial flutter: Secondary | ICD-10-CM | POA: Diagnosis not present

## 2023-10-07 DIAGNOSIS — I5022 Chronic systolic (congestive) heart failure: Secondary | ICD-10-CM | POA: Insufficient documentation

## 2023-10-07 DIAGNOSIS — R0602 Shortness of breath: Secondary | ICD-10-CM | POA: Diagnosis not present

## 2023-10-07 DIAGNOSIS — E782 Mixed hyperlipidemia: Secondary | ICD-10-CM | POA: Diagnosis not present

## 2023-10-07 DIAGNOSIS — G4733 Obstructive sleep apnea (adult) (pediatric): Secondary | ICD-10-CM | POA: Diagnosis not present

## 2023-10-07 DIAGNOSIS — I10 Essential (primary) hypertension: Secondary | ICD-10-CM | POA: Diagnosis not present

## 2023-10-07 DIAGNOSIS — I493 Ventricular premature depolarization: Secondary | ICD-10-CM | POA: Diagnosis not present

## 2023-10-07 DIAGNOSIS — I429 Cardiomyopathy, unspecified: Secondary | ICD-10-CM | POA: Diagnosis not present

## 2023-10-07 LAB — BRAIN NATRIURETIC PEPTIDE: B Natriuretic Peptide: 259.4 pg/mL — ABNORMAL HIGH (ref 0.0–100.0)

## 2023-10-12 ENCOUNTER — Other Ambulatory Visit: Payer: Self-pay | Admitting: Internal Medicine

## 2023-10-12 DIAGNOSIS — I429 Cardiomyopathy, unspecified: Secondary | ICD-10-CM

## 2023-10-12 DIAGNOSIS — R0602 Shortness of breath: Secondary | ICD-10-CM

## 2023-10-12 DIAGNOSIS — I5022 Chronic systolic (congestive) heart failure: Secondary | ICD-10-CM

## 2023-10-27 ENCOUNTER — Other Ambulatory Visit: Payer: Self-pay | Admitting: Internal Medicine

## 2023-10-27 ENCOUNTER — Ambulatory Visit
Admission: RE | Admit: 2023-10-27 | Discharge: 2023-10-27 | Disposition: A | Discharge status: Home / Self Care | Source: Ambulatory Visit | Attending: Internal Medicine | Admitting: Internal Medicine

## 2023-10-27 DIAGNOSIS — R0602 Shortness of breath: Secondary | ICD-10-CM

## 2023-10-27 DIAGNOSIS — I5022 Chronic systolic (congestive) heart failure: Secondary | ICD-10-CM

## 2023-10-27 DIAGNOSIS — I429 Cardiomyopathy, unspecified: Secondary | ICD-10-CM

## 2023-10-27 MED ORDER — GADOBUTROL 1 MMOL/ML IV SOLN
10.0000 mL | Freq: Once | INTRAVENOUS | Status: AC | PRN
  Administered 2023-10-27: 10 mL via INTRAVENOUS

## 2023-11-23 DIAGNOSIS — R7303 Prediabetes: Secondary | ICD-10-CM | POA: Diagnosis not present

## 2023-11-23 DIAGNOSIS — I10 Essential (primary) hypertension: Secondary | ICD-10-CM | POA: Diagnosis not present

## 2023-11-23 DIAGNOSIS — E038 Other specified hypothyroidism: Secondary | ICD-10-CM | POA: Diagnosis not present

## 2023-11-30 DIAGNOSIS — E7800 Pure hypercholesterolemia, unspecified: Secondary | ICD-10-CM | POA: Diagnosis not present

## 2023-11-30 DIAGNOSIS — I10 Essential (primary) hypertension: Secondary | ICD-10-CM | POA: Diagnosis not present

## 2023-11-30 DIAGNOSIS — I779 Disorder of arteries and arterioles, unspecified: Secondary | ICD-10-CM | POA: Diagnosis not present

## 2023-11-30 DIAGNOSIS — I493 Ventricular premature depolarization: Secondary | ICD-10-CM | POA: Diagnosis not present

## 2023-11-30 DIAGNOSIS — E782 Mixed hyperlipidemia: Secondary | ICD-10-CM | POA: Diagnosis not present

## 2023-11-30 DIAGNOSIS — I5022 Chronic systolic (congestive) heart failure: Secondary | ICD-10-CM | POA: Diagnosis not present

## 2023-11-30 DIAGNOSIS — R7303 Prediabetes: Secondary | ICD-10-CM | POA: Diagnosis not present

## 2023-11-30 DIAGNOSIS — E038 Other specified hypothyroidism: Secondary | ICD-10-CM | POA: Diagnosis not present

## 2023-12-07 DIAGNOSIS — I5022 Chronic systolic (congestive) heart failure: Secondary | ICD-10-CM | POA: Diagnosis not present

## 2023-12-07 DIAGNOSIS — I493 Ventricular premature depolarization: Secondary | ICD-10-CM | POA: Diagnosis not present

## 2023-12-08 DIAGNOSIS — I5022 Chronic systolic (congestive) heart failure: Secondary | ICD-10-CM | POA: Diagnosis not present

## 2023-12-08 DIAGNOSIS — I493 Ventricular premature depolarization: Secondary | ICD-10-CM | POA: Diagnosis not present

## 2023-12-15 ENCOUNTER — Other Ambulatory Visit

## 2024-01-20 DIAGNOSIS — I429 Cardiomyopathy, unspecified: Secondary | ICD-10-CM | POA: Diagnosis not present

## 2024-01-20 DIAGNOSIS — I4892 Unspecified atrial flutter: Secondary | ICD-10-CM | POA: Diagnosis not present

## 2024-01-20 DIAGNOSIS — I493 Ventricular premature depolarization: Secondary | ICD-10-CM | POA: Diagnosis not present

## 2024-01-20 DIAGNOSIS — I5022 Chronic systolic (congestive) heart failure: Secondary | ICD-10-CM | POA: Diagnosis not present

## 2024-01-20 DIAGNOSIS — I4891 Unspecified atrial fibrillation: Secondary | ICD-10-CM | POA: Diagnosis not present

## 2024-01-20 DIAGNOSIS — G4733 Obstructive sleep apnea (adult) (pediatric): Secondary | ICD-10-CM | POA: Diagnosis not present

## 2024-01-20 DIAGNOSIS — I428 Other cardiomyopathies: Secondary | ICD-10-CM | POA: Diagnosis not present

## 2024-01-20 DIAGNOSIS — R002 Palpitations: Secondary | ICD-10-CM | POA: Diagnosis not present

## 2024-01-20 DIAGNOSIS — I10 Essential (primary) hypertension: Secondary | ICD-10-CM | POA: Diagnosis not present

## 2024-01-20 DIAGNOSIS — E782 Mixed hyperlipidemia: Secondary | ICD-10-CM | POA: Diagnosis not present

## 2024-01-20 DIAGNOSIS — R0602 Shortness of breath: Secondary | ICD-10-CM | POA: Diagnosis not present

## 2024-02-07 DIAGNOSIS — H26493 Other secondary cataract, bilateral: Secondary | ICD-10-CM | POA: Diagnosis not present

## 2024-02-07 DIAGNOSIS — H43813 Vitreous degeneration, bilateral: Secondary | ICD-10-CM | POA: Diagnosis not present

## 2024-02-07 DIAGNOSIS — Z961 Presence of intraocular lens: Secondary | ICD-10-CM | POA: Diagnosis not present

## 2024-02-07 DIAGNOSIS — M3501 Sicca syndrome with keratoconjunctivitis: Secondary | ICD-10-CM | POA: Diagnosis not present

## 2024-02-22 IMAGING — MG DIGITAL DIAGNOSTIC BILAT W/ TOMO W/ CAD
6 of 10 series · 6 of 30 positions shown · non-contrast
Comparison: Previous exam(s).

CLINICAL DATA: Mildly tender mass felt by the patient in the
retroareolar left breast for the past 2 months. He started on
multiple cardiac medications 5 to 6 months ago and more recently.

EXAM:
DIGITAL DIAGNOSTIC BILATERAL MAMMOGRAM WITH TOMOSYNTHESIS AND CAD
TECHNIQUE: Bilateral digital diagnostic mammography and breast tomosynthesis
was performed. The images were evaluated with computer-aided
detection.

[L TAN synth-2D]
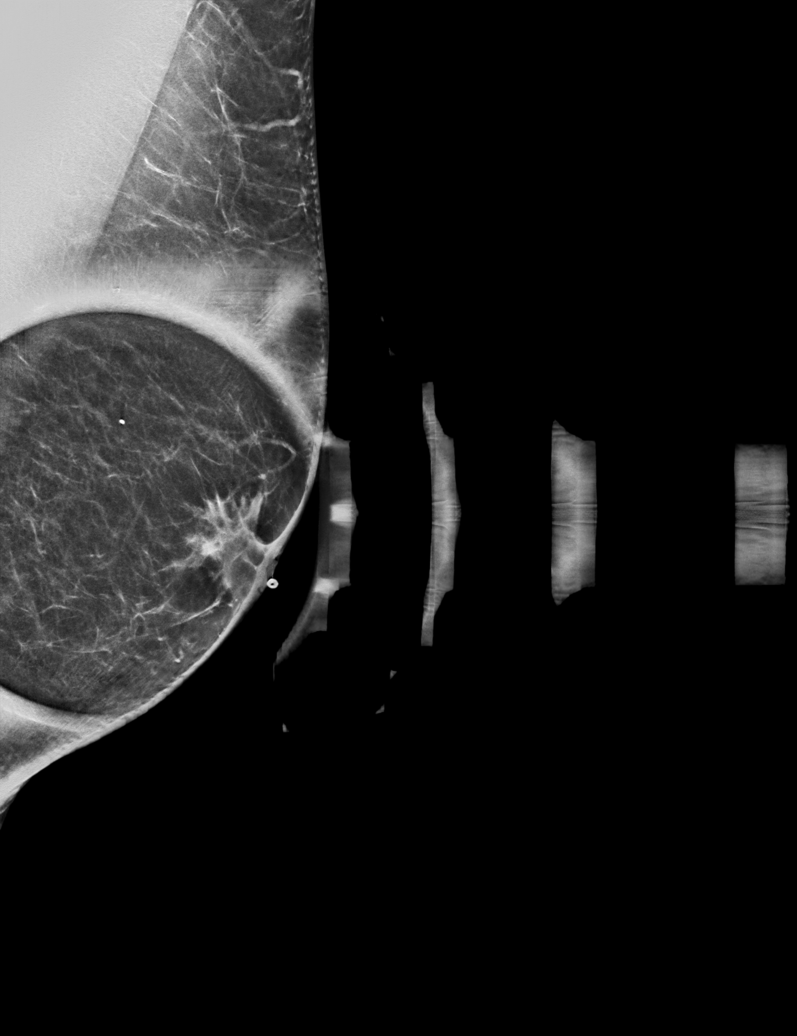

[R MLO synth-2D]
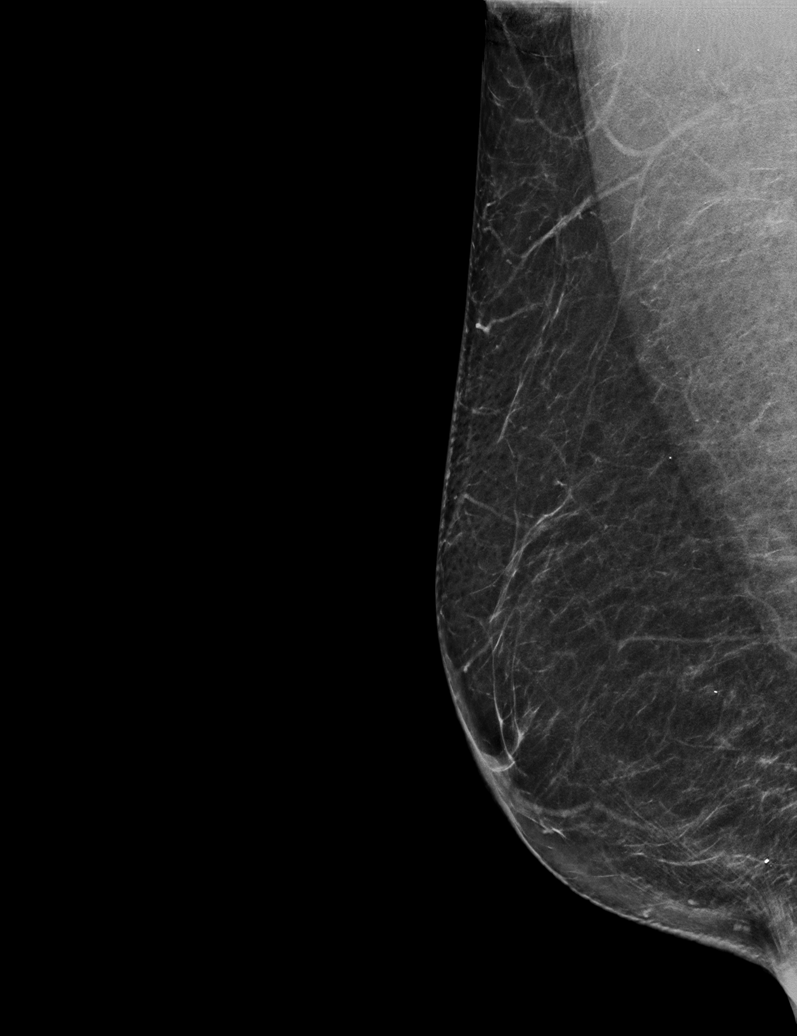

[L CC synth-2D]
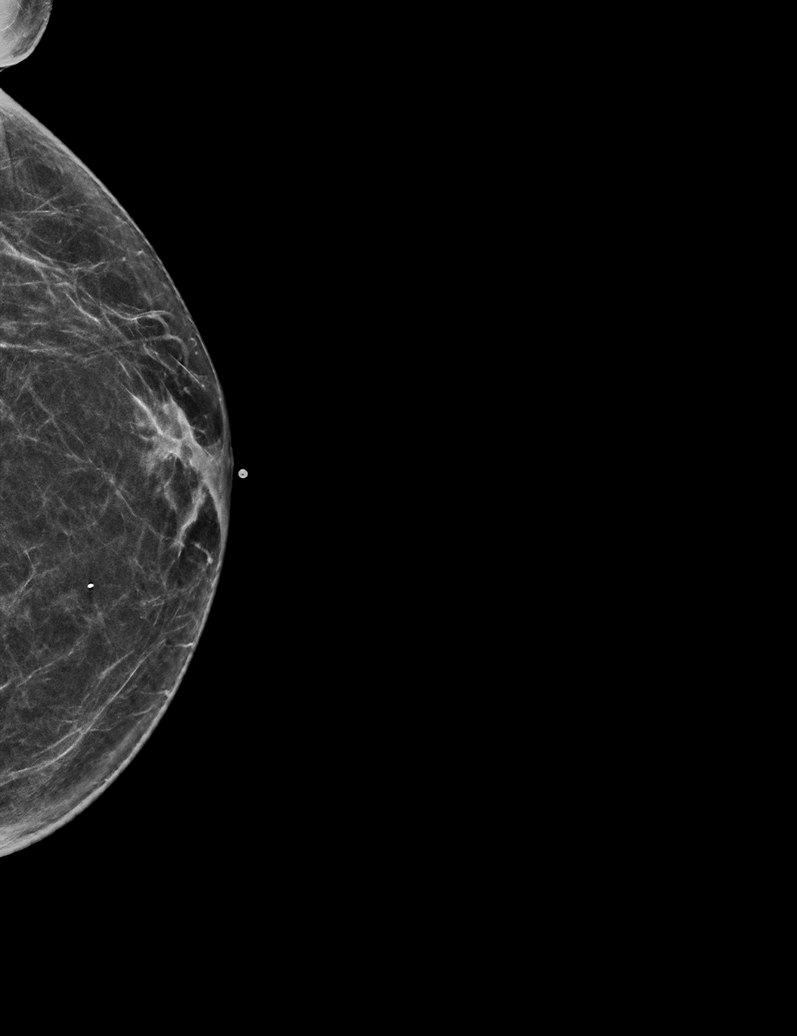

[L MLO synth-2D]
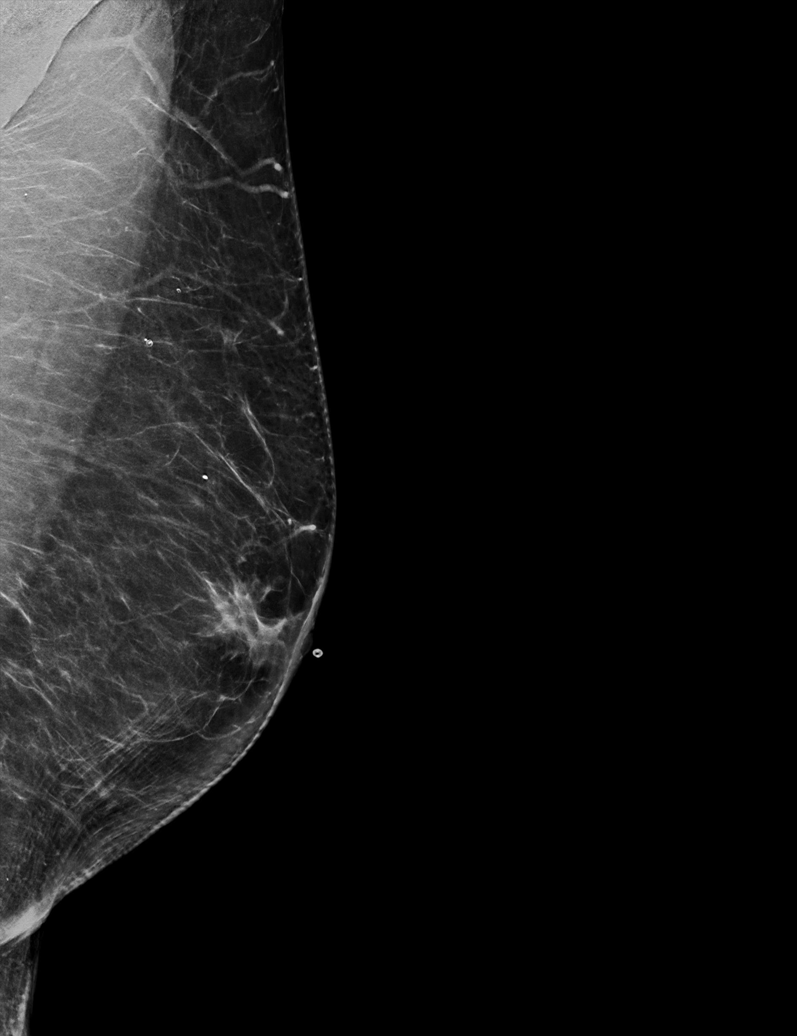

[R CC synth-2D]
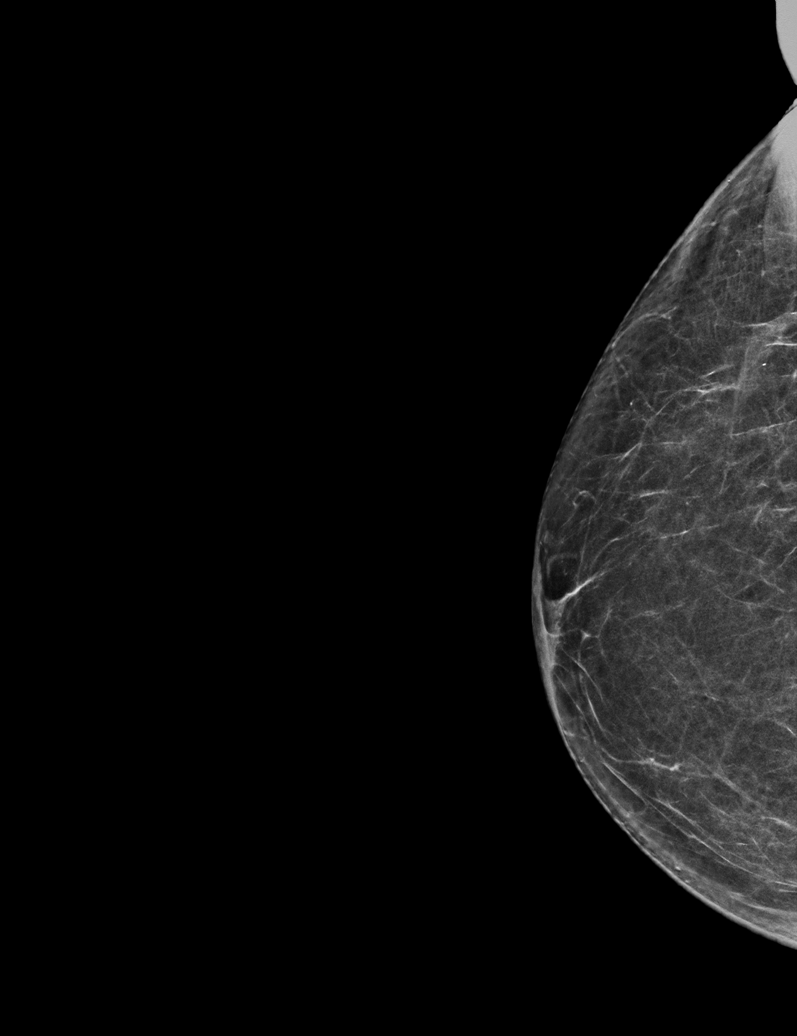

[R CC tomo · tomo slice 29/58.0]
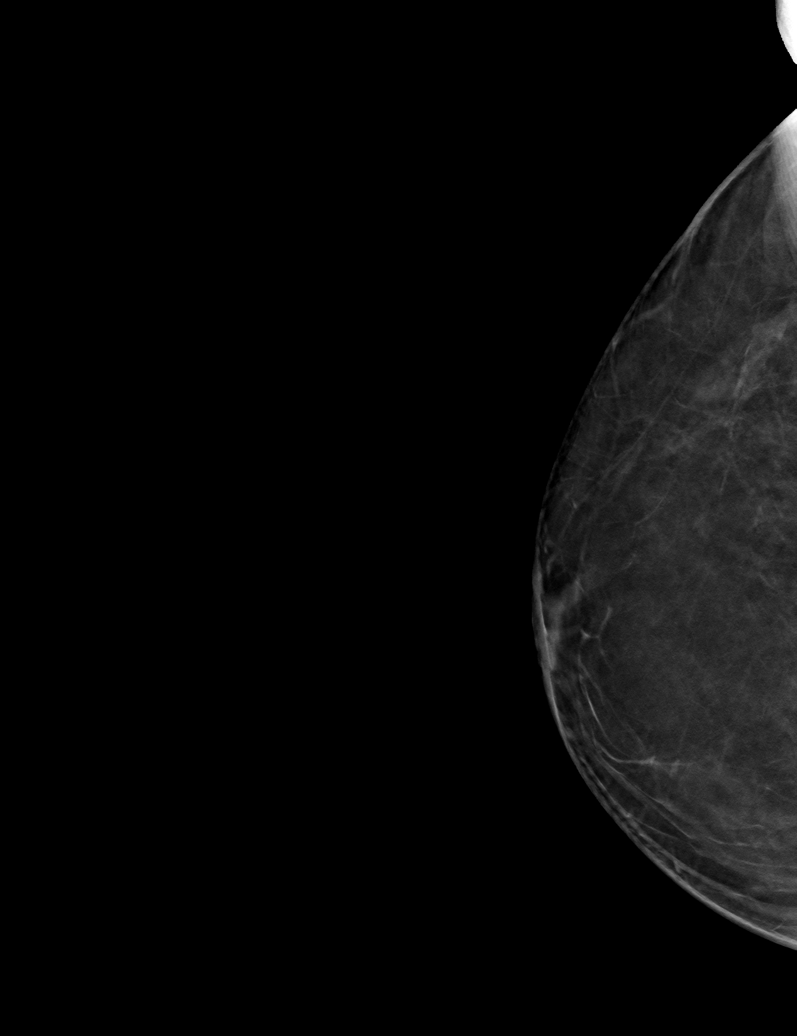

[6 of 30 positions shown; findings below may reference images not displayed]

ACR Breast Density Category c: The breast tissue is heterogeneously
dense, which may obscure small masses.
FINDINGS: Heterogeneously dense glandular tissue is demonstrated in the
retroareolar left breast, corresponding to the mass felt by the
patient, marked with a metallic marker. Normal fatty tissue in the
right breast. On physical examination, the patient has an
approximately 2 cm rounded area of rubbery tissue in the
retroareolar left breast. This is tender to palpation today.
IMPRESSION: Left breast benign gynecomastia.  No evidence of malignancy.

RECOMMENDATION:
Clinical follow-up.

I have discussed the findings and recommendations with the patient.
If applicable, a reminder letter will be sent to the patient
regarding the next appointment.

BI-RADS CATEGORY  2: Benign.

## 2024-02-25 DIAGNOSIS — L720 Epidermal cyst: Secondary | ICD-10-CM | POA: Diagnosis not present

## 2024-02-25 DIAGNOSIS — D2261 Melanocytic nevi of right upper limb, including shoulder: Secondary | ICD-10-CM | POA: Diagnosis not present

## 2024-02-25 DIAGNOSIS — D2262 Melanocytic nevi of left upper limb, including shoulder: Secondary | ICD-10-CM | POA: Diagnosis not present

## 2024-02-25 DIAGNOSIS — R208 Other disturbances of skin sensation: Secondary | ICD-10-CM | POA: Diagnosis not present

## 2024-02-25 DIAGNOSIS — D2271 Melanocytic nevi of right lower limb, including hip: Secondary | ICD-10-CM | POA: Diagnosis not present

## 2024-02-25 DIAGNOSIS — D225 Melanocytic nevi of trunk: Secondary | ICD-10-CM | POA: Diagnosis not present

## 2024-02-25 DIAGNOSIS — L728 Other follicular cysts of the skin and subcutaneous tissue: Secondary | ICD-10-CM | POA: Diagnosis not present

## 2024-02-25 DIAGNOSIS — G4733 Obstructive sleep apnea (adult) (pediatric): Secondary | ICD-10-CM | POA: Diagnosis not present

## 2024-02-25 DIAGNOSIS — L570 Actinic keratosis: Secondary | ICD-10-CM | POA: Diagnosis not present

## 2024-02-25 DIAGNOSIS — L538 Other specified erythematous conditions: Secondary | ICD-10-CM | POA: Diagnosis not present

## 2024-03-08 ENCOUNTER — Ambulatory Visit: Attending: Cardiology | Admitting: Cardiology

## 2024-03-08 ENCOUNTER — Encounter: Payer: Self-pay | Admitting: Cardiology

## 2024-03-08 VITALS — BP 130/60 | HR 57 | Ht 70.5 in | Wt 190.0 lb

## 2024-03-08 DIAGNOSIS — I779 Disorder of arteries and arterioles, unspecified: Secondary | ICD-10-CM | POA: Diagnosis not present

## 2024-03-08 DIAGNOSIS — I493 Ventricular premature depolarization: Secondary | ICD-10-CM | POA: Diagnosis not present

## 2024-03-08 DIAGNOSIS — I5022 Chronic systolic (congestive) heart failure: Secondary | ICD-10-CM

## 2024-03-08 NOTE — Progress Notes (Signed)
  Electrophysiology Office Note:    Date:  03/08/2024   ID:  Cody DELENA Mallick Sr., DOB 1941-04-06, MRN [id number]  [facility] Cardiologist:  None  [facility] Electrophysiologist:  OLE ONEIDA HOLTS, MD   Referring MD: Florencio Cara BIRCH, MD   Chief Complaint: PVCs  History of Present Illness:    Mr. Cody Pollard is an 83 year old man who I am seeing today for an evaluation of PVCs at the request of Dr. Florencio.  The patient was last seen by him January 20, 2024.  He has a history of systolic heart failure PVCs, A-fib, hypertension, hyperlipidemia and obstructive sleep apnea.  Recent heart monitor showed a 28% burden of PVCs.  He was asymptomatic.  He is doing well today.  He actually saw [facility] electrophysiology earlier today [name]).  [facility] started him on amiodarone  400 mg by mouth twice daily for 10 days followed by 200 mg by mouth daily.      Their past medical, social and family history was reviewed.   ROS:   Please see the history of present illness.    All other systems reviewed and are negative.  EKGs/Labs/Other Studies Reviewed:    The following studies were reviewed today:  Oct 27, 2023 cardiac MRI EF 33% Global hypokinesis Septal dyskinesis/bounce Mid wall late gadolinium enhancement at the RV insertion  Nov 03, 2022 EKG shows sinus rhythm.  Frequent PVCs, monomorphic.  PVCs have a inferior axis and precordial transition in V2.      Physical Exam:    VS:  BP 130/60 (BP Location: Left Arm, Patient Position: Sitting, Cuff Size: Normal)   Pulse (!) 57   Ht 5' 10.5 (1.791 m)   Wt 190 lb (86.2 kg)   SpO2 95%   BMI 26.88 kg/m     Wt Readings from Last 3 Encounters:  03/08/24 190 lb (86.2 kg)  07/06/23 189 lb (85.7 kg)  06/28/23 187 lb 3.2 oz (84.9 kg)     GEN: no distress CARD: Irregular rhythm, No MRG RESP: No IWOB. CTAB.        ASSESSMENT AND PLAN:    1. PVC's (premature ventricular contractions)   2. Chronic systolic CHF (congestive  heart failure) (HCC)     #Frequent PVCs Previously suppressed with amiodarone .  He is being restarted on amiodarone  by [facility] as of today.  I agree with this plan.  I would recommend monitoring his heart rate closely.  We did discuss the possibility that he would require a permanent pacemaker in the future for chronotropic support.  He is amenable to this if needed.  I would not recommend ICD implant given his age.  Would recommend repeating a Holter monitor and echo in 3 months.  If his heart rate starts to average in the low 40s, I have instructed him to reach out to [facility] or us  and we will likely stop his Coreg  at that time.  #Chronic systolic heart failure NYHA class II.  Warm and dry on exam today.  Follows with [facility].  Follow-up with [facility] on an as-needed basis.    Signed, OLE ONEIDA. HOLTS, MD, [facility], [facility] 03/08/2024 3:36 PM    Electrophysiology [facility]

## 2024-03-08 NOTE — Patient Instructions (Signed)
 Medication Instructions:  Your physician recommends that you continue on your current medications as directed. Please refer to the Current Medication list given to you today.  *If you need a refill on your cardiac medications before your next appointment, please call your pharmacy*  Follow-Up: At [facility], you and your health needs are our priority.  As part of our continuing mission to provide you with exceptional heart care, our providers are all part of one team.  This team includes your primary Cardiologist (physician) and Advanced Practice Providers or APPs (Physician Assistants and Nurse Practitioners) who all work together to provide you with the care you need, when you need it.  Your next appointment:   As needed with Dr. Cindie

## 2024-04-20 DIAGNOSIS — I493 Ventricular premature depolarization: Secondary | ICD-10-CM | POA: Diagnosis not present

## 2024-04-30 NOTE — Progress Notes (Unsigned)
 PCP: Lenon Buffy, MD (last seen 06/24) Primary Cardiologist: Florencio Kava, MD (last seen 09/24)  HPI:  Mr Fetterman is a 83 y/o male with a history of atrial fibrillation, hyperlipidemia, HTN, BPH, kidney stones, trigeminal neuralgia, sleep apnea and chronic heart failure.   Has not been admitted or been in the ED in the last 6 months.  Echo 06/12/21: EF of 25-30% Echo 08/25/21:  EF of 30%. Echo 01/23/22: EF of 50-55% along with mildly elevated PA pressure, mild LAE and mild MR.   Echo 03/23/23: EF 47-50% with mild LAE  RHC/LHC 06/13/21: Prox LAD lesion is 50% stenosed.   There is severe left ventricular systolic dysfunction.   LV end diastolic pressure is mildly elevated.   The left ventricular ejection fraction is less than 25% by visual estimate.   No significant obstructive coronary disease   Normal right heart pressures   Dilated nonischemic cardiomyopathy  He presents today for a HF follow up visit with a chief complaint of minimal fatigue with moderate exertion. Chronic in nature. Has associated shortness of breath, dizziness in the mornings or with sudden position changes along with this. Sleeping well with 2 pillows. Denies chest pain, cough, palpitations, abdominal distention, pedal edema or weight gain.    Received his flu vaccine for this reason. 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   ROS: All systems negative except as listed in HPI, PMH and Problem List.  SH:  Social History   Socioeconomic History   Marital status: Married    Spouse name: Not on file   Number of children: Not on file   Years of education: Not on file   Highest education level: Not on file  Occupational History   Not on file  Tobacco Use   Smoking status: Never   Smokeless tobacco: Never  Vaping Use   Vaping status: Never Used  Substance and Sexual Activity   Alcohol use: No   Drug use: No   Sexual activity: Not on file  Other Topics Concern   Not on file   Social History Narrative   Not on file   Social Drivers of Health   Financial Resource Strain: Low Risk  (05/29/2023)   Received from [facility]   Overall Financial Resource Strain (CARDIA)    Difficulty of Paying Living Expenses: Not very hard  Food Insecurity: No Food Insecurity (05/29/2023)   Received from [facility]   Hunger Vital Sign    Within the past 12 months, you worried that your food would run out before you got the money to buy more.: Never true    Within the past 12 months, the food you bought just didn't last and you didn't have money to get more.: Never true  Transportation Needs: No Transportation Needs (05/29/2023)   Received from [facility] - Transportation    In the past 12 months, has lack of transportation kept you from medical appointments or from getting medications?: No    Lack of Transportation (Non-Medical): No  Physical Activity: Not on file  Stress: Not on file  Social Connections: Not on file  Intimate Partner Violence: Not on file    FH: No family history on file.  Past Medical History:  Diagnosis Date   Arrhythmia    atrial fibrillation   BPH (benign prostatic hyperplasia)    CHF (congestive heart failure) (HCC)    Chronic diastolic CHF (congestive heart failure) (HCC)  Chronic systolic heart failure (HCC)    Complication of anesthesia    vertigo type symptoms   Edema    MILD FEET/ANKLES OCCAS   History of kidney stones    Hyperlipidemia    Hypertension    Neuromuscular disorder (HCC)    TRIGEMINAL NEURALGIA   Palpitations    PONV (postoperative nausea and vomiting)    Sleep apnea    DOES NOT USE CPAP   Vertigo    Wears dentures    partial upper    Current Outpatient Medications  Medication Sig Dispense Refill   aspirin  EC 81 MG tablet Take 162 mg by mouth daily.     carvedilol  (COREG ) 3.125 MG tablet Take 6.25 mg by mouth 2 (two) times daily with a meal.      dimenhyDRINATE (DRAMAMINE) 50 MG tablet Take 50 mg by mouth every 8 (eight) hours as needed.     dutasteride (AVODART) 0.5 MG capsule Take 0.5 mg by mouth daily.     FARXIGA  10 MG TABS tablet TAKE 1 TABLET BY MOUTH DAILY BEFORE BREAKFAST. 30 tablet 5   furosemide  (LASIX ) 20 MG tablet Take 20 mg by mouth daily.     gabapentin (NEURONTIN) 300 MG capsule Take 300 mg by mouth 3 (three) times daily.     rosuvastatin  (CRESTOR ) 10 MG tablet Take 10 mg by mouth daily.     sacubitril -valsartan  (ENTRESTO ) 24-26 MG Take 1 tablet by mouth 2 (two) times daily. 180 tablet 3   spironolactone  (ALDACTONE ) 25 MG tablet Take 12.5 mg by mouth daily.     No current facility-administered medications for this visit.   There were no vitals filed for this visit.  Wt Readings from Last 3 Encounters:  03/08/24 86.2 kg  07/06/23 85.7 kg  06/28/23 84.9 kg   Lab Results  Component Value Date   CREATININE 1.11 11/12/2021   CREATININE 1.15 10/08/2021   CREATININE 0.86 06/14/2021   PHYSICAL EXAM:  General:  Well appearing. No resp difficulty HEENT: normal Neck: supple. JVP flat. Carotids 2+ bilaterally; no bruits. No lymphadenopathy or thryomegaly appreciated. Cor: PMI normal. Regular rate & rhythm. No rubs, gallops or murmurs. Lungs: clear Abdomen: soft, nontender, nondistended. No hepatosplenomegaly. No bruits or masses.  Extremities: no cyanosis, clubbing, rash, trace pitting edema right lower leg but he's been standing at work all day Neuro: alert & orientedx3, cranial nerves grossly intact. Moves all 4 extremities w/o difficulty. Affect pleasant.   ECG: not done   ASSESSMENT & PLAN:  1: NICM with preserved ejection fraction- - etiology likely HTN - NYHA class II - euvolemic today - weighing daily; reminded to call for an overnight weight gain of > 2 pounds or a weekly weight gain of > 5 pounds - weight down 6 pounds from last visit here 6 months ago - Echo 01/23/22: EF of 50-55% along with mildly  elevated PA pressure, mild LAE and mild MR.  - Echo 08/25/21:  EF of 30%.  - Echo 06/12/21: EF of 25-30% - Echo 03/23/23: EF 47-50% with mild LAE - RHC/LHC 06/13/21: Prox LAD lesion is 50% stenosed.   There is severe left ventricular systolic dysfunction.   LV end diastolic pressure is mildly elevated.   The left ventricular ejection fraction is less than 25% by visual estimate.   No significant obstructive coronary disease   Normal right heart pressures   Dilated nonischemic cardiomyopathy - not adding salt and has been looking at food labels - saw cardiology (Daubert) 9/25 - continue  carvedilol  3.125mg  BID - continue farxiga  10mg  daily - continue furosemide  20mg  daily - continue entresto  24/26mg  BID - adhering to 64 ounces/day of fluid - BNP 10/07/23 was 259.4  2: HTN- - BP 129/54 - saw PCP Lucio) 06/24 - BMP 11/23/23 reviewed and showed sodium 141, potassium 4.2, creatinine 1.0 and GFR 75  3: PAF-  - saw EP Marny) 9/25 - regular rhythm today  4: Sleep apnea- - wearing CPAP 5-6 hours/ night  - reports sleeping well  Offered to make appt PRN but patient prefers to come yearly. Return sooner if needed.   Ellouise Class, FNP-C / Solomon Zailyn Thoennes, FNP-S 05/01/24

## 2024-05-01 ENCOUNTER — Encounter: Payer: Self-pay | Admitting: Family

## 2024-05-01 ENCOUNTER — Ambulatory Visit: Payer: PPO | Attending: Family | Admitting: Family

## 2024-05-01 VITALS — BP 115/57 | HR 51 | Wt 187.6 lb

## 2024-05-01 DIAGNOSIS — G4733 Obstructive sleep apnea (adult) (pediatric): Secondary | ICD-10-CM

## 2024-05-01 DIAGNOSIS — I5042 Chronic combined systolic (congestive) and diastolic (congestive) heart failure: Secondary | ICD-10-CM | POA: Insufficient documentation

## 2024-05-01 DIAGNOSIS — N400 Enlarged prostate without lower urinary tract symptoms: Secondary | ICD-10-CM | POA: Diagnosis not present

## 2024-05-01 DIAGNOSIS — H9319 Tinnitus, unspecified ear: Secondary | ICD-10-CM | POA: Insufficient documentation

## 2024-05-01 DIAGNOSIS — Z79899 Other long term (current) drug therapy: Secondary | ICD-10-CM | POA: Insufficient documentation

## 2024-05-01 DIAGNOSIS — I428 Other cardiomyopathies: Secondary | ICD-10-CM | POA: Insufficient documentation

## 2024-05-01 DIAGNOSIS — E785 Hyperlipidemia, unspecified: Secondary | ICD-10-CM | POA: Insufficient documentation

## 2024-05-01 DIAGNOSIS — R531 Weakness: Secondary | ICD-10-CM | POA: Diagnosis not present

## 2024-05-01 DIAGNOSIS — I420 Dilated cardiomyopathy: Secondary | ICD-10-CM | POA: Insufficient documentation

## 2024-05-01 DIAGNOSIS — Z7982 Long term (current) use of aspirin: Secondary | ICD-10-CM | POA: Insufficient documentation

## 2024-05-01 DIAGNOSIS — I10 Essential (primary) hypertension: Secondary | ICD-10-CM | POA: Diagnosis not present

## 2024-05-01 DIAGNOSIS — R5383 Other fatigue: Secondary | ICD-10-CM | POA: Insufficient documentation

## 2024-05-01 DIAGNOSIS — I110 Hypertensive heart disease with heart failure: Secondary | ICD-10-CM | POA: Insufficient documentation

## 2024-05-01 DIAGNOSIS — Z7984 Long term (current) use of oral hypoglycemic drugs: Secondary | ICD-10-CM | POA: Insufficient documentation

## 2024-05-01 DIAGNOSIS — I480 Paroxysmal atrial fibrillation: Secondary | ICD-10-CM | POA: Diagnosis not present

## 2024-05-01 DIAGNOSIS — Z87442 Personal history of urinary calculi: Secondary | ICD-10-CM | POA: Insufficient documentation

## 2024-05-01 DIAGNOSIS — G4730 Sleep apnea, unspecified: Secondary | ICD-10-CM | POA: Diagnosis not present

## 2024-05-01 DIAGNOSIS — I482 Chronic atrial fibrillation, unspecified: Secondary | ICD-10-CM | POA: Insufficient documentation

## 2024-05-01 DIAGNOSIS — R002 Palpitations: Secondary | ICD-10-CM | POA: Insufficient documentation

## 2024-05-01 MED ORDER — FARXIGA 10 MG PO TABS: 10.0000 mg | ORAL_TABLET | Freq: Every day | ORAL | 3 refills | Status: AC

## 2024-05-01 NOTE — Patient Instructions (Signed)
 Medication Changes:  No medication changes today!   Follow-Up in: Please follow up with the [facility] in 1 year with Ellouise Class, FNP.   Thank you for choosing [facility].    At the [facility], you and your health needs are our priority. We have a designated team specialized in the treatment of Heart Failure. This Care Team includes your primary Heart Failure Specialized Cardiologist (physician), Advanced Practice Providers (APPs- Physician Assistants and Nurse Practitioners), and Pharmacist who all work together to provide you with the care you need, when you need it.   You may see any of the following providers on your designated Care Team at your next follow up:  Dr. Toribio Fuel Dr. Ezra Shuck Dr. Ria Commander Dr. Morene Brownie Ellouise Class, FNP Jaun Bash, RPH-CPP  Please be sure to bring in all your medications bottles to every appointment.   Need to Contact Us :  If you have any questions or concerns before your next appointment please send us  a message through [place] or call our office at [phone].    TO LEAVE A MESSAGE FOR THE NURSE SELECT OPTION 2, PLEASE LEAVE A MESSAGE INCLUDING: YOUR NAME DATE OF BIRTH CALL BACK NUMBER REASON FOR CALL**this is important as we prioritize the call backs  YOU WILL RECEIVE A CALL BACK THE SAME DAY AS LONG AS YOU CALL BEFORE 4:00 PM

## 2024-05-03 DIAGNOSIS — I493 Ventricular premature depolarization: Secondary | ICD-10-CM | POA: Diagnosis not present

## 2024-05-08 ENCOUNTER — Other Ambulatory Visit (HOSPITAL_COMMUNITY): Payer: Self-pay

## 2024-05-15 ENCOUNTER — Telehealth: Payer: Self-pay

## 2024-05-15 NOTE — Telephone Encounter (Signed)
 Advanced Heart Failure Patient Advocate Encounter  Re-enrollment [place] faxed to [employer] on 05/15/2024  Stephaine H, CPhT Rx Patient Advocate Phone: [phone]

## 2024-05-29 NOTE — Telephone Encounter (Signed)
 Patient was approved to receive Farxiga  from [employer] Effective 05/29/2024 to 06/21/2025

## 2025-05-01 ENCOUNTER — Ambulatory Visit: Admitting: Family
